# Patient Record
Sex: Female | Born: 1965 | Race: White | Hispanic: No | Marital: Married | Smoking: Never smoker
Health system: Southern US, Community
[De-identification: ages and names within clinical notes are randomized; demographics above are authoritative.]

## PROBLEM LIST (undated history)

## (undated) DIAGNOSIS — E669 Obesity, unspecified: Secondary | ICD-10-CM

## (undated) DIAGNOSIS — Z9089 Acquired absence of other organs: Secondary | ICD-10-CM

## (undated) DIAGNOSIS — R42 Dizziness and giddiness: Secondary | ICD-10-CM

## (undated) DIAGNOSIS — M722 Plantar fascial fibromatosis: Secondary | ICD-10-CM

## (undated) DIAGNOSIS — E785 Hyperlipidemia, unspecified: Secondary | ICD-10-CM

## (undated) DIAGNOSIS — I10 Essential (primary) hypertension: Secondary | ICD-10-CM

## (undated) DIAGNOSIS — R002 Palpitations: Secondary | ICD-10-CM

## (undated) HISTORY — DX: Essential (primary) hypertension: I10

## (undated) HISTORY — DX: Palpitations: R00.2

## (undated) HISTORY — DX: Obesity, unspecified: E66.9

## (undated) HISTORY — DX: Hyperlipidemia, unspecified: E78.5

## (undated) HISTORY — DX: Dizziness and giddiness: R42

## (undated) HISTORY — DX: Plantar fascial fibromatosis: M72.2

## (undated) HISTORY — DX: Acquired absence of other organs: Z90.89

---

## 1988-09-29 HISTORY — PX: BLADDER SURGERY: SHX569

## 2005-04-03 ENCOUNTER — Ambulatory Visit: Payer: Self-pay | Admitting: Family Medicine

## 2007-11-09 ENCOUNTER — Ambulatory Visit: Payer: Self-pay | Admitting: Family Medicine

## 2007-11-12 ENCOUNTER — Telehealth (INDEPENDENT_AMBULATORY_CARE_PROVIDER_SITE_OTHER): Payer: Self-pay | Admitting: Internal Medicine

## 2007-11-13 ENCOUNTER — Ambulatory Visit: Payer: Self-pay | Admitting: Family Medicine

## 2008-04-18 ENCOUNTER — Ambulatory Visit: Payer: Self-pay | Admitting: Family Medicine

## 2008-04-18 DIAGNOSIS — G43009 Migraine without aura, not intractable, without status migrainosus: Secondary | ICD-10-CM | POA: Insufficient documentation

## 2008-04-24 ENCOUNTER — Ambulatory Visit: Payer: Self-pay | Admitting: Family Medicine

## 2008-04-24 ENCOUNTER — Other Ambulatory Visit: Admission: RE | Admit: 2008-04-24 | Discharge: 2008-04-24 | Payer: Self-pay | Admitting: Family Medicine

## 2008-04-24 ENCOUNTER — Encounter (INDEPENDENT_AMBULATORY_CARE_PROVIDER_SITE_OTHER): Payer: Self-pay | Admitting: Internal Medicine

## 2008-04-24 DIAGNOSIS — E669 Obesity, unspecified: Secondary | ICD-10-CM | POA: Insufficient documentation

## 2008-04-25 ENCOUNTER — Encounter (INDEPENDENT_AMBULATORY_CARE_PROVIDER_SITE_OTHER): Payer: Self-pay | Admitting: Internal Medicine

## 2008-04-25 LAB — CONVERTED CEMR LAB
Basophils Absolute: 0 10*3/uL (ref 0.0–0.1)
Basophils Relative: 0.7 % (ref 0.0–3.0)
CO2: 28 meq/L (ref 19–32)
Calcium: 9.1 mg/dL (ref 8.4–10.5)
Chloride: 100 meq/L (ref 96–112)
Cholesterol: 226 mg/dL (ref 0–200)
Cholesterol: 217 mg/dL (ref 0–200)
Creatinine, Ser: 0.8 mg/dL (ref 0.4–1.2)
Direct LDL: 161.4 mg/dL
Direct LDL: 156.5 mg/dL
Glucose, Bld: 103 mg/dL — ABNORMAL HIGH (ref 70–99)
HDL: 32.1 mg/dL — ABNORMAL LOW (ref >39.0)
Hemoglobin: 14.6 g/dL (ref 12.0–15.0)
Lymphocytes Relative: 30.1 % (ref 12.0–46.0)
Monocytes Relative: 10.2 % (ref 3.0–12.0)
Neutro Abs: 3.6 10*3/uL (ref 1.4–7.7)
Neutrophils Relative %: 57 % (ref 43.0–77.0)
RBC: 4.73 M/uL (ref 3.87–5.11)
Sodium: 137 meq/L (ref 135–145)
Triglycerides: 210 mg/dL (ref 0–149)
WBC: 6.1 10*3/uL (ref 4.5–10.5)

## 2008-04-28 ENCOUNTER — Encounter (INDEPENDENT_AMBULATORY_CARE_PROVIDER_SITE_OTHER): Payer: Self-pay | Admitting: Internal Medicine

## 2008-04-28 ENCOUNTER — Encounter (INDEPENDENT_AMBULATORY_CARE_PROVIDER_SITE_OTHER): Payer: Self-pay | Admitting: *Deleted

## 2008-05-30 ENCOUNTER — Ambulatory Visit: Payer: Self-pay | Admitting: Family Medicine

## 2008-08-16 ENCOUNTER — Telehealth (INDEPENDENT_AMBULATORY_CARE_PROVIDER_SITE_OTHER): Payer: Self-pay | Admitting: Internal Medicine

## 2008-08-30 ENCOUNTER — Ambulatory Visit: Payer: Self-pay | Admitting: Family Medicine

## 2008-08-31 ENCOUNTER — Telehealth (INDEPENDENT_AMBULATORY_CARE_PROVIDER_SITE_OTHER): Payer: Self-pay | Admitting: *Deleted

## 2008-09-05 ENCOUNTER — Ambulatory Visit: Payer: Self-pay | Admitting: Family Medicine

## 2008-09-07 ENCOUNTER — Ambulatory Visit: Payer: Self-pay | Admitting: Family Medicine

## 2008-09-07 DIAGNOSIS — E785 Hyperlipidemia, unspecified: Secondary | ICD-10-CM | POA: Insufficient documentation

## 2008-09-07 DIAGNOSIS — E1165 Type 2 diabetes mellitus with hyperglycemia: Secondary | ICD-10-CM

## 2008-09-07 DIAGNOSIS — IMO0002 Reserved for concepts with insufficient information to code with codable children: Secondary | ICD-10-CM | POA: Insufficient documentation

## 2008-09-07 LAB — CONVERTED CEMR LAB: Hgb A1c MFr Bld: 6.4 % — ABNORMAL HIGH (ref 4.6–6.0)

## 2008-09-25 ENCOUNTER — Encounter (INDEPENDENT_AMBULATORY_CARE_PROVIDER_SITE_OTHER): Payer: Self-pay | Admitting: Internal Medicine

## 2008-09-25 ENCOUNTER — Encounter: Payer: Self-pay | Admitting: Family Medicine

## 2008-09-25 ENCOUNTER — Ambulatory Visit: Payer: Self-pay | Admitting: Family Medicine

## 2008-09-25 LAB — CONVERTED CEMR LAB
LH: <0.1 milliintl units/mL
TSH: 1.976 microintl units/mL (ref 0.350–4.50)

## 2008-10-10 ENCOUNTER — Encounter (INDEPENDENT_AMBULATORY_CARE_PROVIDER_SITE_OTHER): Payer: Self-pay | Admitting: Internal Medicine

## 2008-10-10 ENCOUNTER — Ambulatory Visit: Payer: Self-pay | Admitting: Family Medicine

## 2009-01-25 ENCOUNTER — Encounter (INDEPENDENT_AMBULATORY_CARE_PROVIDER_SITE_OTHER): Payer: Self-pay | Admitting: Internal Medicine

## 2009-02-05 ENCOUNTER — Telehealth (INDEPENDENT_AMBULATORY_CARE_PROVIDER_SITE_OTHER): Payer: Self-pay

## 2009-02-15 ENCOUNTER — Ambulatory Visit: Payer: Self-pay | Admitting: Family Medicine

## 2009-02-15 DIAGNOSIS — I10 Essential (primary) hypertension: Secondary | ICD-10-CM | POA: Insufficient documentation

## 2009-08-22 ENCOUNTER — Ambulatory Visit: Payer: Self-pay | Admitting: Family Medicine

## 2009-08-22 ENCOUNTER — Encounter (INDEPENDENT_AMBULATORY_CARE_PROVIDER_SITE_OTHER): Payer: Self-pay | Admitting: *Deleted

## 2009-08-28 LAB — CONVERTED CEMR LAB
Basophils Absolute: 0.1 10*3/uL (ref 0.0–0.1)
Basophils Relative: 0.8 % (ref 0.0–3.0)
Calcium: 9.2 mg/dL (ref 8.4–10.5)
Eosinophils Relative: 3.4 % (ref 0.0–5.0)
GFR calc non Af Amer: 82.22 mL/min (ref >60)
HCT: 40.1 % (ref 36.0–46.0)
Hemoglobin: 13.8 g/dL (ref 12.0–15.0)
Lymphocytes Relative: 28.5 % (ref 12.0–46.0)
Lymphs Abs: 2 10*3/uL (ref 0.7–4.0)
Monocytes Relative: 6.8 % (ref 3.0–12.0)
Neutro Abs: 4.1 10*3/uL (ref 1.4–7.7)
Potassium: 4.5 meq/L (ref 3.5–5.1)
RBC: 4.38 M/uL (ref 3.87–5.11)
Sodium: 140 meq/L (ref 135–145)
WBC: 6.9 10*3/uL (ref 4.5–10.5)

## 2009-10-25 ENCOUNTER — Ambulatory Visit: Payer: Self-pay | Admitting: Internal Medicine

## 2009-12-26 ENCOUNTER — Telehealth: Payer: Self-pay | Admitting: Family Medicine

## 2010-02-08 ENCOUNTER — Ambulatory Visit: Payer: Self-pay | Admitting: Family Medicine

## 2010-04-26 ENCOUNTER — Ambulatory Visit: Payer: Self-pay | Admitting: Family Medicine

## 2010-04-26 DIAGNOSIS — E559 Vitamin D deficiency, unspecified: Secondary | ICD-10-CM | POA: Insufficient documentation

## 2010-04-27 ENCOUNTER — Encounter: Payer: Self-pay | Admitting: Family Medicine

## 2010-04-29 LAB — CONVERTED CEMR LAB
ALT: 59 units/L — ABNORMAL HIGH (ref 0–35)
Alkaline Phosphatase: 40 units/L (ref 39–117)
Basophils Absolute: 0.1 10*3/uL (ref 0.0–0.1)
Bilirubin, Direct: 0.1 mg/dL (ref 0.0–0.3)
CO2: 25 meq/L (ref 19–32)
Calcium: 9.2 mg/dL (ref 8.4–10.5)
Chloride: 103 meq/L (ref 96–112)
Eosinophils Absolute: 0.1 10*3/uL (ref 0.0–0.7)
Glucose, Bld: 107 mg/dL — ABNORMAL HIGH (ref 70–99)
Hemoglobin: 14 g/dL (ref 12.0–15.0)
Lymphocytes Relative: 24.9 % (ref 12.0–46.0)
Lymphs Abs: 1.8 10*3/uL (ref 0.7–4.0)
MCHC: 34.1 g/dL (ref 30.0–36.0)
Neutro Abs: 4.7 10*3/uL (ref 1.4–7.7)
Platelets: 245 10*3/uL (ref 150.0–400.0)
Potassium: 4.5 meq/L (ref 3.5–5.1)
RDW: 12.9 % (ref 11.5–14.6)
Sodium: 134 meq/L — ABNORMAL LOW (ref 135–145)
TSH: 1.5 microintl units/mL (ref 0.35–5.50)
Total Bilirubin: 0.6 mg/dL (ref 0.3–1.2)

## 2010-04-29 LAB — HM DIABETES EYE EXAM: HM Diabetic Eye Exam: NORMAL

## 2010-05-06 ENCOUNTER — Telehealth: Payer: Self-pay | Admitting: Internal Medicine

## 2010-05-07 ENCOUNTER — Ambulatory Visit: Payer: Self-pay | Admitting: Gastroenterology

## 2010-05-07 DIAGNOSIS — R634 Abnormal weight loss: Secondary | ICD-10-CM | POA: Insufficient documentation

## 2010-05-10 ENCOUNTER — Telehealth: Payer: Self-pay | Admitting: Nurse Practitioner

## 2010-05-24 ENCOUNTER — Ambulatory Visit: Payer: Self-pay | Admitting: Internal Medicine

## 2010-07-23 ENCOUNTER — Telehealth (INDEPENDENT_AMBULATORY_CARE_PROVIDER_SITE_OTHER): Payer: Self-pay | Admitting: *Deleted

## 2010-09-27 ENCOUNTER — Encounter: Payer: Self-pay | Admitting: Family Medicine

## 2010-09-27 ENCOUNTER — Ambulatory Visit: Payer: Self-pay | Admitting: Family Medicine

## 2010-09-30 LAB — CONVERTED CEMR LAB
Albumin: 3.9 g/dL (ref 3.5–5.2)
Alkaline Phosphatase: 35 units/L — ABNORMAL LOW (ref 39–117)
BUN: 15 mg/dL (ref 6–23)
Basophils Absolute: 0 10*3/uL (ref 0.0–0.1)
CO2: 28 meq/L (ref 19–32)
Calcium: 9.2 mg/dL (ref 8.4–10.5)
Cholesterol: 243 mg/dL — ABNORMAL HIGH (ref 0–200)
Creatinine, Ser: 0.8 mg/dL (ref 0.4–1.2)
Direct LDL: 182.9 mg/dL
Eosinophils Absolute: 0.2 10*3/uL (ref 0.0–0.7)
GFR calc non Af Amer: 86.81 mL/min (ref >60.00)
Glucose, Bld: 117 mg/dL — ABNORMAL HIGH (ref 70–99)
HDL: 39.4 mg/dL (ref >39.00)
Hemoglobin: 14.1 g/dL (ref 12.0–15.0)
Lymphocytes Relative: 25.5 % (ref 12.0–46.0)
MCHC: 33.9 g/dL (ref 30.0–36.0)
Monocytes Relative: 7 % (ref 3.0–12.0)
Neutro Abs: 5.1 10*3/uL (ref 1.4–7.7)
Neutrophils Relative %: 64.6 % (ref 43.0–77.0)
Platelets: 287 10*3/uL (ref 150.0–400.0)
RDW: 12.9 % (ref 11.5–14.6)
Total Bilirubin: 0.6 mg/dL (ref 0.3–1.2)
Triglycerides: 206 mg/dL — ABNORMAL HIGH (ref 0.0–149.0)
Vit D, 25-Hydroxy: 35 ng/mL (ref 30–89)

## 2010-10-02 ENCOUNTER — Other Ambulatory Visit
Admission: RE | Admit: 2010-10-02 | Discharge: 2010-10-02 | Payer: Self-pay | Source: Home / Self Care | Admitting: Family Medicine

## 2010-10-02 ENCOUNTER — Ambulatory Visit
Admission: RE | Admit: 2010-10-02 | Discharge: 2010-10-02 | Payer: Self-pay | Source: Home / Self Care | Attending: Family Medicine | Admitting: Family Medicine

## 2010-10-02 DIAGNOSIS — M722 Plantar fascial fibromatosis: Secondary | ICD-10-CM | POA: Insufficient documentation

## 2010-10-02 LAB — HM PAP SMEAR

## 2010-10-07 ENCOUNTER — Ambulatory Visit
Admission: RE | Admit: 2010-10-07 | Discharge: 2010-10-07 | Payer: Self-pay | Source: Home / Self Care | Attending: Family Medicine | Admitting: Family Medicine

## 2010-10-16 ENCOUNTER — Encounter: Payer: Self-pay | Admitting: Family Medicine

## 2010-10-16 ENCOUNTER — Ambulatory Visit
Admission: RE | Admit: 2010-10-16 | Discharge: 2010-10-16 | Payer: Self-pay | Source: Home / Self Care | Attending: Family Medicine | Admitting: Family Medicine

## 2010-10-16 LAB — CONVERTED CEMR LAB
Bilirubin Urine: NEGATIVE
Casts: 0 /lpf
KOH Prep: NEGATIVE
Specific Gravity, Urine: >=1.03
Urine crystals, microscopic: 0 /hpf
WBC Urine, dipstick: NEGATIVE
pH: 6

## 2010-10-28 LAB — HM MAMMOGRAPHY: HM Mammogram: NORMAL

## 2010-10-29 ENCOUNTER — Ambulatory Visit: Payer: Self-pay | Admitting: Family Medicine

## 2010-10-29 ENCOUNTER — Encounter: Payer: Self-pay | Admitting: Family Medicine

## 2010-10-29 NOTE — Progress Notes (Signed)
Summary: triage  Phone Note Call from Patient Call back at Home Phone [phone] Call back at [phone]   Caller: Patient Call For: Dr. Leone Payor Reason for Call: Talk to Nurse Summary of Call: since July pt has been experiencing mid abd pain, separate pain radiating down right side and into lower back area, and diarrhea... has actually had these symptoms off and on for 2 years, but since July these symptoms have become almost daily... pt states she cannot wait until next available in September... has been to PCP, tested, nothing diagnosed Initial call taken by: Vallarie Mare,  May 06, 2010 11:01 AM  Follow-up for Phone Call        Pt. will see Willette Cluster NP on 05-07-10 at 11am. Pt. advised of med.list/co-pay/cx.policy. Follow-up by: Laureen Ochs LPN,  May 06, 2010 11:10 AM

## 2010-10-29 NOTE — Assessment & Plan Note (Signed)
Summary: F/U ABD PAIN, DIARRHEA, saw NP   History of Present Illness Visit Type: Follow-up Visit Primary GI MD: Stan Head MD [facility] Primary Provider: Judith Part MD Requesting Provider: Vic Ripper, FNP Chief Complaint: abdominal pain and diarrhea follow-up  History of Present Illness:   She was seen on 8/8 for diarrhea. Had been on antibiotics and metronidazole was prescribed. Her diarrhea seems to be resolved, no bowel movement in 4 days. Stools are still loose and urgent.  She stopped metformin while she was on the metronidzole because she had headaches and felt unwell.  When she has her menstrual cycle , the week prior to menses, she is constipated, has diarrhea then back to normal with regular defecation.   GI Review of Systems    Reports acid reflux.      Denies abdominal pain, belching, bloating, chest pain, dysphagia with liquids, dysphagia with solids, heartburn, loss of appetite, nausea, vomiting, vomiting blood, weight loss, and  weight gain.        Denies anal fissure, black tarry stools, change in bowel habit, constipation, diarrhea, diverticulosis, fecal incontinence, heme positive stool, hemorrhoids, irritable bowel syndrome, jaundice, light color stool, liver problems, rectal bleeding, and  rectal pain.    Current Medications (verified): 1)  Aleve 220 Mg Tabs (Naproxen Sodium) .... Two Tablets By Mouth Once Daily As Needed 2)  Relpax 40 Mg Tabs (Eletriptan Hydrobromide) .Marland Kitchen.. 1 At Onset of Headache,, Repeat in 2 Hrs If Not Resolved 3)  Metformin Hcl 500 Mg Xr24h-Tab (Metformin Hcl) .... Three Tablets By Mouth in The Evening 4)  Lisinopril 20 Mg Tabs (Lisinopril) .... One Tablet By Mouth Once Daily 5)  Prevacid 24hr 15 Mg Cpdr (Lansoprazole) .... One Daily 6)  Aspirin 81 Mg  Tabs (Aspirin) .... One Daily 7)  Zyrtec Hives Relief 10 Mg Tabs (Cetirizine Hcl) .... Otc As Directed. 8)  Amaryl 1 Mg Tabs (Glimepiride) .... Take 1 Tablet By Mouth Once A Day 9)  Vitamin  D3 5000 Unit/ml Liqd (Cholecalciferol) .... Take One Tab Daily By Mouth 10)  Onetouch Ultra Test  Strp (Glucose Blood) .... Test Once Daily or As Needed. Dm 250.00 11)  Levsin/sl 0.125 Mg Subl (Hyoscyamine Sulfate) .Marland Kitchen.. 1 Pill Under Toungue Up To Every 6 Hours For Abdominal Cramps  Allergies (verified): No Known Drug Allergies  Past History:  Past Medical History: Reviewed history from 02/08/2010 and no changes required. HYPERTENSION (ICD-401.9) HYPERLIPIDEMIA (ICD-272.4) DIABETES MELLITUS, TYPE II, CONTROLLED (ICD-250.00) PALPITATIONS (ICD-785.1) OBESITY (ICD-278.00) COMMON MIGRAINE (ICD-346.10) VERTIGO (ICD-780.4)   endocrine   Past Surgical History: Reviewed history from 02/14/2009 and no changes required. Bladder tack after her 2nd delivery in 1990 Tonsillectomy  1988  Family History: Reviewed history from 10/25/2009 and no changes required. Father: died at 33--MI due to alolergic reaction to pen. Mother: thyroidectomy--cause not known, RA, HBP, ? drug addiction, depression  Siblings: 3 sisters--1 with thyroid cancer, reoccured last yr(2008), depression                                1--twin to pt--L&W, depression                                 1--L&W, depression               1 br--drug addiction DM- 0 MI- 0  CVA- MGM Prostate Cancer-0 Breast Cancer-0 Ovarian Cancer-0  Uterine Cancer-0 Colon Cancer-0 Drug/ ETOH Abuse- 0 Depression- mother's side MGM--Alzheimers  Family History of Ulcerative Colitis: Son  Social History: Reviewed history from 10/25/2009 and no changes required. Occupation: school sysem front office  Married children 2 boys Never Smoked Alcohol use-no Regular exercise-no  Vital Signs:  Patient profile:   45 year old female Height:      63.5 inches Weight:      206 pounds BMI:     36.05 Pulse rate:   84 / minute Pulse rhythm:   regular BP sitting:   118 / 64  (left arm)  Vitals Entered By: Milford Cage NCMA (May 24, 2010 1:32  PM)  Physical Exam  General:  Well developed, well nourished, no acute distress.   Impression & Recommendations:  Problem # 1:  INFECTIOUS DIARRHEA (ICD-009.2) Assessment Improved She has resolved or nearly so after taking metronidazole. It sounds like she had C.diff or other antibiotic-associated colitis. she may be having some post-IBS whch should resolve. She is at risk for small bowel bacterial overgrowth also so that was possible. will see how she does. F/U as needed.  Patient Instructions: 1)  If you fail to return to your normal bowel habit let Dr. Leone Payor know (or if things deteriorate and diarrhea returns). 2)  Please schedule a follow-up appointment as needed.  3)  The medication list was reviewed and reconciled.  All changed / newly prescribed medications were explained.  A complete medication list was provided to the patient / caregiver. cc: Reather Littler, MD

## 2010-10-29 NOTE — Assessment & Plan Note (Signed)
Summary: TICK BITE   Vital Signs:  Patient profile:   45 year old female Height:      63.5 inches Weight:      212.25 pounds BMI:     37.14 Temp:     98.1 degrees F oral Pulse rate:   80 / minute Pulse rhythm:   regular BP sitting:   132 / 82  (right arm) Cuff size:   large  Vitals Entered By: Lewanda Rife LPN (Feb 08, 2010 8:21 AM) CC: Tick  bites on left upper leg and under lt arm   History of Present Illness: got bitten by ticks  4 bites -- rear / back / under L arm / L leg  was in the woods on sat 7th (one on leg tues) , other 3 sunday   no target shape rash   other insect bites   kept one tick  got them all off   feels fine  no fever/ no n/v / no st and no joint pain  one on leg is much redder and tender   also needs me to fill out form for her work - stating her bmi , ? for insurance     Allergies (verified): No Known Drug Allergies  Past History:  Past Surgical History: Last updated: 02/14/2009 Bladder tack after her 2nd delivery in 1990 Tonsillectomy  1988  Family History: Last updated: Nov 14, 2009 Father: died at 33--MI due to alolergic reaction to pen. Mother: thyroidectomy--cause not known, RA, HBP, ? 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 Eyes:  Denies blurring and eye pain. ENT:  Denies nasal congestion and sore throat. CV:  Denies chest pain or discomfort. Resp:  Denies cough and wheezing. GI:  Denies abdominal pain, bloody stools, change in bowel habits, and nausea. Derm:  Complains of itching and lesion(s); denies poor wound healing. Neuro:  Denies numbness and tingling. Endo:  Denies cold intolerance, excessive thirst, excessive urination, and heat intolerance. Heme:  Denies abnormal bruising and bleeding.  Physical Exam  General:  overweight but generally well appearing  Head:  normocephalic, atraumatic, and no abnormalities observed.   Eyes:  vision grossly intact, pupils equal, pupils round, and pupils reactive to light.  no conjunctival pallor, injection or icterus  Mouth:  pharynx  pink and moist.   Neck:  No deformities, masses, or tenderness noted. Lungs:  Normal respiratory effort, chest expands symmetrically. Lungs are clear to auscultation, no crackles or wheezes. Heart:  Normal rate and regular rhythm. S1 and S2 normal without gallop, murmur, click, rub or other extra sounds. Abdomen:  soft and non-tender.   Msk:  no acute joint changes Extremities:  No clubbing, cyanosis, edema, or deformity noted with normal full range of motion of all joints.   Neurologic:  sensation intact to light touch, gait normal, and DTRs symmetrical and normal.   Skin:  tiny red papulse - L buttock and under arm bite on upper L leg with 1-2 cm surrounding erythema -- no target shape and no vesicles  no rash on skin  Cervical Nodes:  No lymphadenopathy  noted Inguinal Nodes:  No significant adenopathy Psych:  normal affect, talkative and pleasant    Impression & Recommendations:  Problem # 1:  TICK BITE (ICD-E906.4) Assessment New no systemic symptoms - but significant inflammation at one site disc in detail how to prev bites in future update Td today tx with doxycycline for infx -- will also hopefully ward off tick fever  rev wound care  adv to update asap if any fever/ constitutional symptoms or joint pain or any inc in redness/ swelling of bites  Problem # 2:  OBESITY (ICD-278.00) Assessment: Unchanged rev bmi today pt will be faxing form for me to fill out for this - she forgot to bring it   Complete Medication List: 1)  Aleve 220 Mg Tabs (Naproxen sodium) .... Two tablets by mouth once daily as needed 2)  Relpax 40 Mg Tabs (Eletriptan hydrobromide) .Marland Kitchen.. 1 at onset of headache,, repeat in 2 hrs if not resolved 3)  Metformin Hcl 500 Mg Xr24h-tab (Metformin hcl) .... Three tablets by mouth in the evening 4)  Lisinopril 20 Mg Tabs (Lisinopril) .... One tablet by mouth once daily 5)  Test Strips For Freestyle Promise Glucometer  .Marland Kitchen.. 1 two times a day --new diabetic  250.00 6)  Crestor 20 Mg Tabs (Rosuvastatin calcium) .... One daily 7)  Prevacid 24hr 15 Mg Cpdr (Lansoprazole) .... One daily 8)  Aspirin 81 Mg Tabs (Aspirin) .... One daily 9)  Zyrtec Hives Relief 10 Mg Tabs (Cetirizine hcl) .... Otc as directed. 10)  Amaryl 1 Mg Tabs (Glimepiride) .... Take 1 tablet by mouth once a day 11)  Vitamin D3 5000 Unit/ml Liqd (Cholecalciferol) .... Take one tab daily by mouth 12)  Doxycycline Hyclate 100 Mg Caps (Doxycycline hyclate) .Marland Kitchen.. 1 by mouth two times a day for 7 days  Other Orders: TD Toxoids IM 7 YR + (16109) Admin 1st Vaccine (60454) Admin 1st Vaccine [facility]) [phone])  Patient Instructions: 1)  take the doxycyline as directed  2)  use any antibiotic ointment on bite on leg  3)  keep it clean and dry with antibacterial  soap and water  4)  if worse redness or swelling or any fever or other symptoms - update me  5)  your bmi is 377.14 6)  tetnus shot today Prescriptions: DOXYCYCLINE HYCLATE 100 MG CAPS (DOXYCYCLINE HYCLATE) 1 by mouth two times a day for 7 days  #14 x 0   Entered and Authorized by:   Judith Part MD   Signed by:   Judith Part MD on 02/08/2010   Method used:   Electronically to        [facility] (retail)  [address], [address]       [place], [place]  [address]       Ph: [phone]       Fax: [phone]   RxID:   [id number]   Current Allergies (reviewed today): No known allergies    Tetanus/Td Vaccine    Vaccine Type: Td    Site: right deltoid    Mfr: Sanofi Pasteur    Dose: 0.5 ml    Route: IM    Given by: Lewanda Rife LPN    Exp. Date: 08/14/2011    Lot #: [id number]    VIS given: 08/17/07 version given Feb 08, 2010.

## 2010-10-29 NOTE — Assessment & Plan Note (Signed)
Summary: diarrhea   Vital Signs:  Patient profile:   45 year old female Height:      63.5 inches Weight:      205.75 pounds BMI:     36.00 Temp:     98 degrees F oral Pulse rate:   72 / minute Pulse rhythm:   regular BP sitting:   132 / 84  (left arm) Cuff size:   large  Vitals Entered By: Lewanda Rife LPN (April 26, 2010 10:40 AM) CC: CPX with pap and breast exam LMP 04/01/10 Pt said she has been sick for one month and wants to talk with Dr Milinda Antis about that.   History of Present Illness:   wt is up 7 lb  bp is fair at 132/84 first dheck     DM AIC 6.4 in the fall-- DrKumar    thyroid ca in family  mam09 self exam   actually wants to talk about diarrhea and stomach cramping  happens intermittently --2 years  happened early in july , and last and this week is much worse  cramping is in upper abdomen - right before she has to have bm -- then relieved  burning sensation over R abd  diarrhea - 6-8 times per day - watery and loose  yellow to green- no blood and no dark stool  gets little things in it an inch long and yellow and a lot of mucous - ? if poss worms  they are white on the inside  some nausea but no vomiting  no fever   no out of country travel   son has ulcerative colitis   is on metformin - knows this can cause diarrhea-- and the problems preceeded it    did camp in june -- was in a cabin was drinking tap water - poss well water  has some pets in house - none with worms  has never been dx with ibs    Allergies (verified): No Known Drug Allergies  Past History:  Past Medical History: Last updated: 02/08/2010 HYPERTENSION (ICD-401.9) HYPERLIPIDEMIA (ICD-272.4) DIABETES MELLITUS, TYPE II, CONTROLLED (ICD-250.00) PALPITATIONS (ICD-785.1) OBESITY (ICD-278.00) COMMON MIGRAINE (ICD-346.10) VERTIGO (ICD-780.4)   endocrine   Past Surgical History: Last updated: 02/14/2009 Bladder tack after her 2nd delivery in 1990 Tonsillectomy   1988  Family History: Last updated: October 29, 2009 Father: died at 33--MI due to alolergic reaction to pen. Mother: thyroidectomy--cause not known, RA, HBP, ? 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 Eyes:  Denies blurring and eye irritation. CV:  Denies chest pain or discomfort, palpitations, shortness of breath with exertion, and swelling of feet. Resp:  Denies cough, shortness of breath, and wheezing. GI:  Complains of abdominal pain, change in bowel habits, and gas; denies bloody stools, loss of appetite, nausea, and vomiting. MS:  Denies joint redness, joint swelling, and cramps. Derm:  Denies lesion(s), poor wound healing, and rash. Neuro:  Denies numbness and tingling. Psych:  mood is ok . Endo:  Denies cold intolerance, excessive thirst, excessive urination, and heat intolerance. Heme:  Denies abnormal bruising and bleeding.  Physical Exam  General:  overweight but generally well appearing seems down today  Head:  normocephalic, atraumatic, and no abnormalities observed.   Eyes:  vision grossly intact, pupils equal, pupils round, and  pupils reactive to light.  no conjunctival pallor, injection or icterus  Mouth:  pharynx pink and moist.   Neck:  No deformities, masses, or tenderness noted. Lungs:  Normal respiratory effort, chest expands symmetrically. Lungs are clear to auscultation, no crackles or wheezes. Heart:  Normal rate and regular rhythm. S1 and S2 normal without gallop, murmur, click, rub or other extra sounds. Abdomen:  tender diffusely- esp epigastrium and LLQ no rebound or gaurding soft, normal bowel sounds, no distention, no masses, no hepatomegaly, and no splenomegaly.   Msk:  No deformity or scoliosis noted of thoracic or lumbar spine.   Extremities:  No clubbing, cyanosis, edema, or deformity noted with normal full range of motion of all joints.   Skin:  Intact without suspicious lesions or rashes nl color and turgor  Psych:  seems down and tired  no facial expression at all good eye contact and comm skills    Impression & Recommendations:  Problem # 1:  DIARRHEA (ICD-787.91) Assessment New intermittent but getting much worse since camping trip in june  pt has seen what may be worms in stool  cramping is severe at times  pre-dated start of metformin given px for levsin sl  disc diff incl parasites/ infectious/ ibs/ inflam bd/bact overgrowth  will do labs and stool tests today and update if all nl will need to ref to GI  Orders: Venipuncture ([address]109) TLB-Renal Function Panel (80069-RENAL) TLB-Hepatic/Liver Function Pnl (80076-HEPATIC) TLB-CBC Platelet - w/Differential (85025-CBCD) TLB-TSH (Thyroid Stimulating Hormone) (84443-TSH) T-Culture, Stool ([id number]) T-Culture, C-Diff Toxin A/B (60454-09811) T-Stool Giardia / Crypto- EIA (91478) T-Stool for O&P (29562-13086)  Problem # 2:  ABDOMINAL PAIN, GENERALIZED (ICD-789.07) Assessment: New at times crampy before loose bm and at other times steady epigastric pain  does also have hx of reflux  adv to stay on prevacid daily instead 14  on 14 off will be mindful of diet - watch for triggers  avoid nsaids - disc poss of gastritis  ref to GI if tests are neg   Problem # 3:  UNSPECIFIED VITAMIN D DEFICIENCY (ICD-268.9) Assessment: New pt on vit D from Dr Lucianne Muss and wants to check it  on 5000 international units daily Orders: T-Vitamin D (25-Hydroxy) [id number] [id number]) Specimen Handling (99000)  Complete Medication List: 1)  Aleve 220 Mg Tabs (Naproxen sodium) .... Two tablets by mouth once daily as needed 2)  Relpax 40 Mg Tabs (Eletriptan hydrobromide) .Marland Kitchen.. 1 at onset of headache,, repeat in 2 hrs if not resolved 3)  Metformin Hcl 500 Mg Xr24h-tab (Metformin hcl) .... Three tablets by mouth in the evening 4)  Lisinopril 20 Mg Tabs (Lisinopril) .... One  tablet by mouth once daily 5)  Prevacid 24hr 15 Mg Cpdr (Lansoprazole) .... One daily 6)  Aspirin 81 Mg Tabs (Aspirin) .... One daily 7)  Zyrtec Hives Relief 10 Mg Tabs (Cetirizine hcl) .... Otc as directed. 8)  Amaryl 1 Mg Tabs (Glimepiride) .... Take 1 tablet by mouth once a day 9)  Vitamin D3 5000 Unit/ml Liqd (Cholecalciferol) .... Take one tab daily by mouth 10)  Onetouch Ultra Test Strp (Glucose blood) .... Test once daily or as needed. dm 250.00 11)  Levsin/sl 0.125 Mg Subl (Hyoscyamine sulfate) .Marland Kitchen.. 1 pill under toungue up to every 6 hours for abdominal cramps  Patient Instructions: 1)  try the levsin for cramps as needed  2)  take the prevacid every single day 3)  stool studies today  4)  if all normal - will refer to GI  5)  keep an eye on your diet  6)  stay away from aleve  7)  re schedule in 1 month physical- any 30 minute slot please is fine  Prescriptions: LEVSIN/SL 0.125 MG SUBL (HYOSCYAMINE SULFATE) 1 pill under toungue up to every 6 hours for abdominal cramps  #30 x 1   Entered and Authorized by:   Judith Part MD   Signed by:   Judith Part MD on 04/26/2010   Method used:   Electronically to        [facility] (retail)       [address], [address]       [place], [place]  [address]       Ph: [phone]       Fax: [phone]   RxID:   [id number]   Current Allergies (reviewed today): No known allergies

## 2010-10-29 NOTE — Assessment & Plan Note (Signed)
Summary: RECTAL PAIN   History of Present Illness Visit Type: consult  Primary GI MD: Stan Head MD [facility] Primary Provider: Vic Ripper, FNP Requesting Provider: Vic Ripper, FNP Chief Complaint: Rectal pain, nausea, constipation, diarrhea, acid reflux, bloating, and belching  History of Present Illness:   45 yo white woman with several month history of intermittent ano-rectal pain. Feels like an open sore or pain when sitting. Fells like it is right at the opening, lasts for up to hours. It ay be painful when she feels stool in the rectum (full there) and can burn with diarrhea.  It does not disturb sleep. It is only when she sits or if er rectum has stool in it. One time she saw red blood on the stool a very small spot.  Lately she has had an increase in GERD sxs as indicated below, x 3 days or so. PPi ususally controls it well.    GI Review of Systems    Reports acid reflux, belching, bloating, heartburn, and  nausea.      Denies abdominal pain, chest pain, dysphagia with liquids, dysphagia with solids, loss of appetite, vomiting, vomiting blood, weight loss, and  weight gain.      Reports constipation, diarrhea, and  rectal pain.     Denies anal fissure, black tarry stools, change in bowel habit, diverticulosis, fecal incontinence, heme positive stool, hemorrhoids, irritable bowel syndrome, jaundice, light color stool, liver problems, and  rectal bleeding.    Current Medications (verified): 1)  Aleve 220 Mg Tabs (Naproxen Sodium) .... Two Tablets By Mouth Once Daily As Needed 2)  Relpax 40 Mg Tabs (Eletriptan Hydrobromide) .Marland Kitchen.. 1 At Onset of Headache,, Repeat in 2 Hrs If Not Resolved 3)  Metformin Hcl 500 Mg Xr24h-Tab (Metformin Hcl) .... Two Tablets By Mouth in The Evening 4)  Lisinopril 20 Mg Tabs (Lisinopril) .... One Tablet By Mouth Once Daily 5)  Test Strips For Freestyle Promise Glucometer .Marland Kitchen.. 1 Two Times A Day --New Diabetic  250.00 6)  Crestor 20 Mg Tabs  (Rosuvastatin Calcium) .... One Daily 7)  Prevacid 24hr 15 Mg Cpdr (Lansoprazole) .... One Daily 8)  Aspirin 81 Mg  Tabs (Aspirin) .... One Daily 9)  Zyrtec Hives Relief 10 Mg Tabs (Cetirizine Hcl) .... Otc As Directed. 10)  Onglyza 5 Mg Tabs (Saxagliptin Hcl) .... One Tablet By Mouth Once Daily  Allergies (verified): No Known Drug Allergies  Past History:  Past Medical History: HYPERTENSION (ICD-401.9) HYPERLIPIDEMIA (ICD-272.4) DIABETES MELLITUS, TYPE II, CONTROLLED (ICD-250.00) PALPITATIONS (ICD-785.1) OBESITY (ICD-278.00) COMMON MIGRAINE (ICD-346.10) VERTIGO (ICD-780.4)  Past Surgical History: Reviewed history from 02/14/2009 and no changes required. Bladder tack after her 2nd delivery in 1990 Tonsillectomy  1988  Family History: Father: died at 33--MI due to alolergic reaction to pen. Mother: thyroidectomy--cause not known, RA, HBP, ? drug addiction, depression  Siblings: 3 sisters--1 with thyroid cancer, reoccured last yr(2008), depression                                1--twin to pt--L&W, depression                                 1--L&W, depression               1 br--drug addiction DM- 0 MI- 0  CVA- MGM Prostate Cancer-0 Breast Cancer-0 Ovarian Cancer-0 Uterine Cancer-0 Colon Cancer-0 Drug/  ETOH Abuse- 0 Depression- mother's side MGM--Alzheimers  Family History of Ulcerative Colitis: Son  Social History: Occupation: school sysem front office  Married children 2 boys Never Smoked Alcohol use-no Regular exercise-no  Review of Systems       +shoulder pain, light-headed x 3 days All other ROS negative except as per HPI.   Vital Signs:  Patient profile:   45 year old female Height:      63.5 inches Weight:      208 pounds BMI:     36.40 BSA:     1.98 Pulse rate:   62 / minute Pulse rhythm:   regular BP sitting:   124 / 68  (left arm) Cuff size:   regular  Vitals Entered By: Ok Anis CMA (October 25, 2009 1:54 PM)  Physical  Exam  General:  obese.  NAD Eyes:  PERRLA, no icterus. Mouth:  No deformity or lesions, dentition normal. Lungs:  Clear throughout to auscultation. Heart:  Regular rate and rhythm; no murmurs, rubs,  or bruits. 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 Will try conservative Tx with NSAIDS, rubber or foam doughnut and follow-up. She is seeing rheumatology in March for shoulder pains ad so ? if she has a polyarthritis or arthropathy. She could need a pelvic MR. I find it hard to make a case for anal or rectal problem.  Patient Instructions: 1)  Purchase Donut as discussed. 2)  Take Aleve 2 tablets by mouth two times a day  3)  Please schedule a follow-up appointment in 1 month.  If your symptoms worsen prior to your appt call our office for a sooner appointment. 4)  Copy sent to : Reather Littler, MD 5)  The medication list was reviewed and reconciled.  All changed / newly prescribed medications were explained.  A complete medication list was provided to the patient / caregiver.

## 2010-10-29 NOTE — Assessment & Plan Note (Signed)
Summary: ABD. PAIN/DIARRHEA, WORSE X1 MONTH.  (DR.GESSNER PT.)  DEBORAH   History of Present Illness Visit Type: follow up Primary GI MD: Stan Head MD [facility] Primary Provider: Judith Part MD Requesting Provider: Vic Ripper, FNP Chief Complaint: Abdominal pain & diarrhea x 1  month History of Present Illness:   Patient is a 45 year old female , new to GI, who presents with one month history of diarrhea. Havng up to 10 loose BMs a day. Has had nocturnal diarrhea only on two occasions. Diarrhea associated with mid abdominal cramping which is usually relieved with defecation. Mild nausea but nothing significant. No rectal bleeding. Has lost several pounds. Diarrhea worse after meals so eating only when really hungry. 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 Was taking Aleve but PCP asked her to stop because of elevated LFTs. Endocrinologist also stopped her Crestor.  On Metformin for three years. Initially it did cause loose stool and patient stopped treatment. After restarting med last fall she had not had any problems until now.   Patient feels all of her health issues started two years ago when she began having alternating constipation and diarrhea.  A few months ago started noticing elongated yellow things in stool.  Retrieved and dissected one of them, it looked like a potato.    GI Review of Systems    Reports abdominal pain, bloating, loss of appetite, and  nausea.     Location of  Abdominal pain: mid abdomen.    Denies acid reflux, belching, chest pain, dysphagia with liquids, dysphagia with solids, heartburn, vomiting, vomiting blood, weight loss, and  weight gain.      Reports diarrhea and  light color stool.     Denies anal fissure, black tarry stools, change in bowel habit, constipation, diverticulosis, fecal incontinence, heme positive stool, hemorrhoids, irritable bowel syndrome, jaundice, liver problems, rectal bleeding, and  rectal  pain.         Current Medications (verified): 1)  Aleve 220 Mg Tabs (Naproxen Sodium) .... Two Tablets By Mouth Once Daily As Needed 2)  Relpax 40 Mg Tabs (Eletriptan Hydrobromide) .Marland Kitchen.. 1 At Onset of Headache,, Repeat in 2 Hrs If Not Resolved 3)  Metformin Hcl 500 Mg Xr24h-Tab (Metformin Hcl) .... Three Tablets By Mouth in The Evening 4)  Lisinopril 20 Mg Tabs (Lisinopril) .... One Tablet By Mouth Once Daily 5)  Prevacid 24hr 15 Mg Cpdr (Lansoprazole) .... One Daily 6)  Aspirin 81 Mg  Tabs (Aspirin) .... One Daily 7)  Zyrtec Hives Relief 10 Mg Tabs (Cetirizine Hcl) .... Otc As Directed. 8)  Amaryl 1 Mg Tabs (Glimepiride) .... Take 1 Tablet By Mouth Once A Day 9)  Vitamin D3 5000 Unit/ml Liqd (Cholecalciferol) .... Take One Tab Daily By Mouth 10)  Onetouch Ultra Test  Strp (Glucose Blood) .... Test Once Daily or As Needed. Dm 250.00 11)  Levsin/sl 0.125 Mg Subl (Hyoscyamine Sulfate) .Marland Kitchen.. 1 Pill Under Toungue Up To Every 6 Hours For Abdominal Cramps  Allergies (verified): No Known Drug Allergies  Past History:  Past Medical History: Reviewed history from 02/08/2010 and no changes required. HYPERTENSION (ICD-401.9) HYPERLIPIDEMIA (ICD-272.4) DIABETES MELLITUS, TYPE II, CONTROLLED (ICD-250.00) PALPITATIONS (ICD-785.1) OBESITY (ICD-278.00) COMMON MIGRAINE (ICD-346.10) VERTIGO (ICD-780.4)   endocrine   Past Surgical History: Reviewed history from 02/14/2009 and no changes required. Bladder tack after her 2nd delivery in 1990 Tonsillectomy  1988  Family History: Reviewed history from 10/25/2009 and no changes required. Father: died at 33--MI due to alolergic  reaction to pen. Mother: thyroidectomy--cause not known, RA, HBP, ? drug addiction, depression  Siblings: 3 sisters--1 with thyroid cancer, reoccured last yr(2008), depression                                1--twin to pt--L&W, depression                                 1--L&W, depression               1 br--drug  addiction DM- 0 MI- 0  CVA- MGM Prostate Cancer-0 Breast Cancer-0 Ovarian Cancer-0 Uterine Cancer-0 Colon Cancer-0 Drug/ ETOH Abuse- 0 Depression- mother's side MGM--Alzheimers  Family History of Ulcerative Colitis: Son  Social History: Reviewed history from 10/25/2009 and no changes required. Occupation: school sysem front office  Married children 2 boys Never Smoked Alcohol use-no Regular exercise-no  Review of Systems       The patient complains of back pain, fatigue, night sweats, shortness of breath, sleeping problems, and headaches-new.  The patient denies allergy/sinus, anemia, anxiety-new, arthritis/joint pain, blood in urine, breast changes/lumps, change in vision, confusion, cough, coughing up blood, depression-new, fainting, fever, hearing problems, heart murmur, heart rhythm changes, itching, menstrual pain, muscle pains/cramps, nosebleeds, pregnancy symptoms, skin rash, sore throat, swelling of feet/legs, swollen lymph glands, thirst - excessive , urination - excessive , urination changes/pain, urine leakage, vision changes, and voice change.    Vital Signs:  Patient profile:   45 year old female Height:      63.5 inches Weight:      202.38 pounds BMI:     35.42 Pulse rate:   88 / minute Pulse rhythm:   regular BP sitting:   132 / 80  (left arm) Cuff size:   regular  Vitals Entered By: June McMurray CMA Duncan Dull) (May 07, 2010 10:47 AM)  Physical Exam  General:  Well developed, well nourished, no acute distress. Head:  Normocephalic and atraumatic. Eyes:  Conjunctiva pink, no icterus.  Mouth:  No oral lesions. Tongue moist.  Neck:  Supple; no masses. Lungs:  Clear throughout to auscultation. Heart:  Regular rate and rhythm; no murmurs, rubs,  or bruits. Abdomen:  Abdomen soft,  nondistended, mild diffuse lower abdominal tenderness.  No obvious masses or hepatomegaly.Normal bowel sounds.  Msk:  Symmetrical with no gross deformities. Normal  posture. Extremities:  No palmar erythema, no edema.  Neurologic:  Alert and  oriented x4;  grossly normal neurologically. Skin:  Intact without significant lesions or rashes. Cervical Nodes:  No significant cervical adenopathy. Psych:  Alert and cooperative. Normal mood and affect.   Impression & Recommendations:  Problem # 1:  DIARRHEA (ICD-787.91) Assessment Deteriorated Associated with mid abdominal cramping that is usually relieved with defecation.  Patient did have antibiotics within the last two months for a  tick bite. Stool studies negative including one C-Diff. Not taking Hyoscyamine as cramps cease before medication even works. Recent CBC and TSH okay. May be IBS. May be microscopic colitis. May be small bowel bacterial overgrowth (diabetes). May be nfectious. Trial of Flagyl, probiotics and low residue diet.  Follow up with Dr. Leone Payor in a couple of weeks and if no improvment may need colonoscopy with random biopsies.   Problem # 2:  DIABETES MELLITUS, TYPE II, CONTROLLED (ICD-250.00) Assessment: Comment Only  Problem # 3:  HYPERTENSION (ICD-401.9) Assessment: Comment  Only  Patient Instructions: 1)  We sent a prescription for Flagyl to [facility] [address], [place]. 2)  Take Align, 1 capsule daily for 14 days.  Samples given. 3)  Low Residue Diet brochure given. 4)  We made you a follow up appointment with Dr. Leone Payor for 05-24-10 at 1:30 PM.  5)  Copy sent to : Judith Part, MD 6)  The medication list was reviewed and reconciled.  All changed / newly prescribed medications were explained.  A complete medication list was provided to the patient / caregiver. Prescriptions: FLAGYL 500 MG TABS (METRONIDAZOLE) Take 1 tab 3 times daily x 10 days  #30 x 0   Entered by:   Lowry Ram NCMA   Authorized by:   Willette Cluster NP   Signed by:   Lowry Ram NCMA on 05/07/2010   Method used:   Electronically to        [facility] [address] (retail)       [address],  [address]       [place], [place]  [address]       Ph: [phone]       Fax: [phone]   RxID:   [id number]

## 2010-10-29 NOTE — Progress Notes (Signed)
----   Converted from flag ---- ---- 07/22/2010 7:49 PM, Colon Flattery Tower MD wrote: please check wellness, lipid, vit D and AIC for v70.0 , vit D def and 250.0 thanks  ---- 07/18/2010 11:50 AM, Liane Comber CMA (AAMA) wrote: Lab orders please! Good Morning! This pt is scheduled for cpx labs Friday, which labs to draw and dx codes to use? Thanks Tasha ------------------------------

## 2010-10-29 NOTE — Progress Notes (Signed)
Summary: Headaches  Phone Note Call from Patient Call back at Home Phone [phone]   Caller: Patient Call For: Willette Cluster, M.D. Summary of Call: Medicine prescribed to her are causing headache. Severe heaches that keep her up at night. Advil upsets her stomach. And she is not supposed to take Tylenol or Ibobuprofen together with the medicine that PA ordered. So is sunsure what else to take. Is unable to answer the phone at work so if she can get a mesage left on her cell phone as to what she can take. Initial call taken by: Leanor Kail [facility],  May 10, 2010 1:50 PM  Follow-up for Phone Call        Gunnar Fusi what can she take in place of Tylenol or Ibuprofen? Follow-up by: Merri Ray CMA Duncan Dull),  May 10, 2010 2:22 PM  Additional Follow-up for Phone Call Additional follow up Details #1::        Left message 05/10/10 that patient could take Aleve for headache but should stop Flagyl if headaches too bothersome. We can then try Xifaxan. Additional Follow-up by: Willette Cluster NP,  May 13, 2010 3:04 PM

## 2010-10-29 NOTE — Progress Notes (Signed)
Summary: relpax  Phone Note Refill Request Call back at Home Phone [phone] Message from:  Patient on December 26, 2009 4:28 PM  Refills Requested: Medication #1:  RELPAX 40 MG TABS 1 at onset of headache [facility] [address]   Initial call taken by: Melody Comas,  December 26, 2009 4:29 PM Caller: Patient Call For: Judith Part MD  Follow-up for Phone Call        px written on EMR for call in  Follow-up by: Judith Part MD,  December 26, 2009 5:08 PM  Additional Follow-up for Phone Call Additional follow up Details #1::        Patient notified as instructed by telephone. Medication phoned to [facility] [address]  pharmacy as instructed. Lewanda Rife LPN  December 26, 2009 5:16 PM     New/Updated Medications: RELPAX 40 MG TABS (ELETRIPTAN HYDROBROMIDE) 1 at onset of headache,, repeat in 2 hrs if not resolved [BMN] Prescriptions: RELPAX 40 MG TABS (ELETRIPTAN HYDROBROMIDE) 1 at onset of headache,, repeat in 2 hrs if not resolved Brand medically necessary #12 x 5   Entered and Authorized by:   Judith Part MD   Signed by:   Lewanda Rife LPN on 25/95/6387   Method used:   Telephoned to ...       [facility] [address] (retail)       [address]       [place], [place]  [address]       Ph: [phone]       Fax: [phone]   RxID:   [id number]

## 2010-10-31 NOTE — Assessment & Plan Note (Signed)
Summary: ? YEAST INFECTION, ? UTI   Vital Signs:  Patient profile:   45 year old female Weight:      213.75 pounds BMI:     36.82 Temp:     98.0 degrees F oral Pulse rate:   92 / minute Pulse rhythm:   regular BP sitting:   126 / 80  (left arm) Cuff size:   large  Vitals Entered By: Selena Batten Dance CMA Duncan Dull) (October 16, 2010 3:30 PM) CC: ? UTI/yeast infection   History of Present Illness: pap smear came back with yeast  was called in diflucan --and took it  some burn/ itch going on since oct  got worse after diflucan - but feels some better today -- thinks she took it thurs or fri  less burning today -- no longer hurts to urinate but still itchy  no discharge   tends to be worse with symptoms rignt before peroid   is due to start any time   is also diabetic   urine burning -- when it hits a sensitive area   sunday / monday -- more burning from the inside and this is different    is urinating frequently - thinks from drinking a lot of water does drink one soda per day     urine is pos for glucose  has not checked it today   Allergies (verified): No Known Drug Allergies  Past History:  Past Medical History: Last updated: 10/02/2010 HYPERTENSION (ICD-401.9) HYPERLIPIDEMIA (ICD-272.4) DIABETES MELLITUS, TYPE II, CONTROLLED (ICD-250.00) PALPITATIONS (ICD-785.1) OBESITY (ICD-278.00) COMMON MIGRAINE (ICD-346.10) VERTIGO (ICD-780.4) plantar fasciitis    endocrine   Past Surgical History: Last updated: 02/14/2009 Bladder tack after her 2nd delivery in 1990 Tonsillectomy  1988  Family History: Last updated: 24-Nov-2009 Father: died at 33--MI due to alolergic reaction to pen. Mother: thyroidectomy--cause not known, RA, HBP, ? drug addiction, depression  Siblings: 3 sisters--1 with thyroid cancer, reoccured last yr(2008), depression                                1--twin to pt--L&W, depression                                 1--L&W, depression  1 br--drug addiction DM- 0 MI- 0  CVA- MGM Prostate Cancer-0 Breast Cancer-0 Ovarian Cancer-0 Uterine Cancer-0 Colon Cancer-0 Drug/ ETOH Abuse- 0 Depression- mother's side MGM--Alzheimers  Family History of Ulcerative Colitis: Son  Social History: Last updated: November 24, 2009 Occupation: school sysem front office  Married children 2 boys Never Smoked Alcohol use-no Regular exercise-no  Risk Factors: Caffeine Use: 0 (04/18/2008) Exercise: no (11/13/2007)  Risk Factors: Smoking Status: never (11/13/2007) Passive Smoke Exposure: no (04/18/2008)  Review of Systems General:  Denies chills, fatigue, fever, loss of appetite, and malaise. CV:  Denies chest pain or discomfort and palpitations. GI:  Denies abdominal pain, nausea, and vomiting. GU:  Complains of dysuria and urinary frequency; denies discharge, hematuria, and urinary hesitancy. MS:  Denies cramps. Derm:  Complains of itching; denies rash. Heme:  Denies abnormal bruising and bleeding.  Physical Exam  General:  overweight but generally well appearing  Head:  normocephalic, atraumatic, and no abnormalities observed.   Eyes:  vision grossly intact, pupils equal, pupils round, and pupils reactive to light.  no conjunctival pallor, injection or icterus  Mouth:  pharynx pink and moist.   Neck:  supple with full rom and no masses or thyromegally, no JVD or carotid bruit  Lungs:  Normal respiratory effort, chest expands symmetrically. Lungs are clear to auscultation, no crackles or wheezes. Heart:  Normal rate and regular rhythm. S1 and S2 normal without gallop, murmur, click, rub or other extra sounds. Abdomen:  Bowel sounds positive,abdomen soft and non-tender without masses, organomegaly or hernias noted. no suprapubic tenderness or fullness felt  Genitalia:  normal introitus, no external lesions, no vaginal discharge, and mucosa pink and moist mild hyperemia of labia minora  .   Msk:  no CVA tenderness  Skin:  Intact  without suspicious lesions or rashes Cervical Nodes:  No lymphadenopathy noted Inguinal Nodes:  No significant adenopathy Psych:  normal affect, talkative and pleasant    Impression & Recommendations:  Problem # 1:  UTI (ICD-599.0) Assessment New tx with cipro and update  ucx pending  pt advised to update me if symptoms worsen or do not improve  Her updated medication list for this problem includes:    Cipro 250 Mg Tabs (Ciprofloxacin hcl) .Marland Kitchen... 1 by mouth two times a day for 5 days  Orders: T-Culture, Urine ([id number]) Specimen Handling (82956) UA Dipstick W/ Micro (manual) (81000)  Problem # 2:  VAGINITIS, CANDIDAL (ICD-112.1) Assessment: New  with some imp s/p diflucan ? if recurrent with DM trial of terazol 3 cream and update  The following medications were removed from the medication list:    Diflucan 150 Mg Tabs (Fluconazole) .Marland Kitchen... 1 by mouth times one for yeast infection  Orders: Wet Prep (21308MV)  Complete Medication List: 1)  Aleve 220 Mg Tabs (Naproxen sodium) .... Two tablets by mouth once daily as needed 2)  Relpax 40 Mg Tabs (Eletriptan hydrobromide) .Marland Kitchen.. 1 at onset of headache,, repeat in 2 hrs if not resolved 3)  Metformin Hcl 500 Mg Xr24h-tab (Metformin hcl) .... 2 tabs by mouth each evening 4)  Lisinopril 20 Mg Tabs (Lisinopril) .... One tablet by mouth once daily 5)  Prevacid 24hr 15 Mg Cpdr (Lansoprazole) .... One by mouth once daily 6)  Aspirin 81 Mg Tabs (Aspirin) .... One daily 7)  Zyrtec Hives Relief 10 Mg Tabs (Cetirizine hcl) .... Take 1 tablet by mouth once a day 8)  Amaryl 1 Mg Tabs (Glimepiride) .... Take 1 tablet by mouth once a day 9)  Onetouch Ultra Test Strp (Glucose blood) .... Test once daily or as needed. dm 250.00 10)  Levsin/sl 0.125 Mg Subl (Hyoscyamine sulfate) .Marland Kitchen.. 1 pill under toungue up to every 6 hours for abdominal cramps 11)  Vitamin D3 1000 Unit Tabs (Cholecalciferol) .Marland Kitchen.. 1 by mouth once daily 12)  Zetia 10 Mg Tabs  (Ezetimibe) .Marland Kitchen.. 1 by mouth once daily 13)  Terazol 3 0.8 % Crea (Terconazole) .... Insert 1 applicator intravaginally at bedtime for 3 days  you can also use some externally for itch 14)  Cipro 250 Mg Tabs (Ciprofloxacin hcl) .Marland Kitchen.. 1 by mouth two times a day for 5 days  Patient Instructions: 1)  take the cipro for urine infection 2)  use terazol for yeast infection  3)  update me if your symptoms do not improve  4)  check your sugar when you get home  Prescriptions: CIPRO 250 MG TABS (CIPROFLOXACIN HCL) 1 by mouth two times a day for 5 days  #10 x 0   Entered and Authorized by:   Judith Part MD   Signed by:   Judith Part MD on 10/16/2010  Method used:   Electronically to        [facility]  [address] (retail)       [address], [address]       [place], [place]  [address]       Ph: [phone]       Fax: [phone]   RxID:   [id number] TERAZOL 3 0.8 % CREA (TERCONAZOLE) insert 1 applicator intravaginally at bedtime for 3 days  you can also use some externally for itch  #1 course x 0   Entered and Authorized by:   Judith Part MD   Signed by:   Judith Part MD on 10/16/2010   Method used:   Electronically to        [facility]  [address] (retail)       [address], [address]       [place], [place]  [address]       Ph: [phone]       Fax: [phone]   RxID:   [id number]    Orders Added: 1)  T-Culture, Urine [[id number]] 2)  Specimen Handling [99000] 3)  UA Dipstick W/ Micro (manual) [81000] 4)  Wet Prep [87210QW] 5)  Est. Patient Level IV [62831]    Current Allergies (reviewed today): No known allergies   Laboratory Results   Urine Tests  Date/Time Received: October 16, 2010 3:31 PM  Date/Time Reported: October 16, 2010 3:31 PM   Routine Urinalysis   Color: lt. yellow Appearance: Hazy Glucose: 2000+   (Normal Range: Negative) Bilirubin: negative   (Normal Range:  Negative) Ketone: negative   (Normal Range: Negative) Spec. Gravity: >=1.030   (Normal Range: 1.003-1.035) Blood: moderate   (Normal Range: Negative) pH: 6.0   (Normal Range: 5.0-8.0) Protein: negative   (Normal Range: Negative) Urobilinogen: 0.2   (Normal Range: 0-1) Nitrite: negative   (Normal Range: Negative) Leukocyte Esterace: negative   (Normal Range: Negative)  Urine Microscopic WBC/HPF: 3-4 RBC/HPF: 0-1 Bacteria/HPF: few Mucous/HPF: few Epithelial/HPF: 1-3 Crystals/HPF: 0 Casts/LPF: 0 Yeast/HPF: 0 Other: 0      Wet Mount Source: vaginal WBC/hpf: 1-5 Bacteria/hpf: rare  Rods Clue cells/hpf: none  Negative whiff Yeast/hpf: few Wet [place] KOH: Negative Trichomonas/hpf: none

## 2010-10-31 NOTE — Assessment & Plan Note (Signed)
Summary: REFER FRM DR. TOWER FOR PLANTAR FASCIITIS / LFW   Vital Signs:  Patient profile:   45 year old female Height:      64 inches Weight:      215.50 pounds BMI:     37.12 Temp:     98.2 degrees F oral Pulse rate:   84 / minute Pulse rhythm:   regular BP sitting:   130 / 70  (left arm) Cuff size:   large  Vitals Entered By: Benny Lennert CMA Duncan Dull) (October 07, 2010 3:52 PM)  History of Present Illness: Chief complaint Plantar fascitis  Dr. Milinda Antis has requested a consult for evaluation of foot pain:  For two years, pain medially, pain with standing up and walking and going to the grocery store. A trip to [employer] is pretty painful.   Changed footwear within the last few days to [facility] Was wearing flats then starting wearing. Has been wearing Nike's  12 years ago in R foot Last April had B injections Saw Dr. Corliss Skains. Multple diffuse pains. Saw a podiatrist. Unsure of who.  Some type of orthotisis, not an orthotic, but what sounds like an AFO type brace Night splints - no help  Massage - at night, husband will help.  Literally has had to crawl at times to avoid putting pressure on her feet.  Diplomatic Services operational officer at [employer].   Allergies (verified): No Known Drug Allergies  Past History:  Past medical, surgical, family and social histories (including risk factors) reviewed, and no changes noted (except as noted below).  Past Medical History: Reviewed history from 10/02/2010 and no changes required. HYPERTENSION (ICD-401.9) HYPERLIPIDEMIA (ICD-272.4) DIABETES MELLITUS, TYPE II, CONTROLLED (ICD-250.00) PALPITATIONS (ICD-785.1) OBESITY (ICD-278.00) COMMON MIGRAINE (ICD-346.10) VERTIGO (ICD-780.4) plantar fasciitis    endocrine   Past Surgical History: Reviewed history from 02/14/2009 and no changes required. Bladder tack after her 2nd delivery in 1990 Tonsillectomy  1988  Family History: Reviewed history from 10/25/2009 and no changes required. Father:  died at 33--MI due to alolergic reaction to pen. Mother: thyroidectomy--cause not known, RA, HBP, ? drug addiction, depression  Siblings: 3 sisters--1 with thyroid cancer, reoccured last yr(2008), depression                                1--twin to pt--L&W, depression                                 1--L&W, depression               1 br--drug addiction DM- 0 MI- 0  CVA- MGM Prostate Cancer-0 Breast Cancer-0 Ovarian Cancer-0 Uterine Cancer-0 Colon Cancer-0 Drug/ ETOH Abuse- 0 Depression- mother's side MGM--Alzheimers  Family History of Ulcerative Colitis: Son  Social History: Reviewed history from 10/25/2009 and no changes required. Occupation: school sysem front office  Married children 2 boys Never Smoked Alcohol use-no Regular exercise-no  Review of Systems       REVIEW OF SYSTEMS  GEN: No systemic complaints, no fevers, chills, sweats, or other acute illnesses MSK: Detailed in the HPI GI: tolerating PO intake without difficulty Neuro: No numbness, parasthesias, or tingling associated. Otherwise the pertinent positives of the ROS are noted above.    Physical Exam  General:  GEN: Well-developed,well-nourished,in no acute distress; alert,appropriate and cooperative throughout examination HEENT: Normocephalic and atraumatic without obvious abnormalities. No apparent alopecia or balding. Ears,  externally no deformities PULM: Breathing comfortably in no respiratory distress EXT: No clubbing, cyanosis, or edema PSYCH: Normally interactive. Cooperative during the interview. Pleasant. Friendly and conversant. Not anxious or depressed appearing. Normal, full affect.  Msk:  Echymosis: no Edema: no ROM: full LE B Gait: heel toe, non-antalgic MT pain: no Callus pattern: none Lateral Mall: NT Medial Mall: NT Talus: NT Navicular: NT Calcaneous: NT Metatarsals: NT 5th MT: NT Phalanges: NT Achilles: NT Plantar Fascia: tender, medial along PF. Pain with forced dorsi Fat  Pad: NT Peroneals: NT Post Tib: NT Great Toe: Nml motion Ant Drawer: neg Other foot breakdown: none Long arch: moderate breakdown Transverse arch: mild breakdown Hindfoot breakdown: none Sensation: intact    Impression & Recommendations:  Problem # 1:  PLANTAR FASCIITIS (ICD-728.71) >45 minutes spent in total face to face time with the patient with >50% of time spent in counselling and coordination of care: classic plantar fasciitis. The patient says symptoms now ongoing continuously for greater than 2 years. I spent an extensive amount of time discussing plantar fasciitis, the anatomy of all, and everything about his condition with the patient. She numerous questions. We discussed its care conservative and surgical.  Recommendations: Add Tuli's heel cups Add arch binders. Continue with current rigid shoe. Review stretching and strengthening program for the [employer] Academy of foot and ankle surgeons. Toe crunches daily. Foot strengthening daily. Massage daily.  If no improvement in 4-6 weeks, I will make her a plastazote orthotic, which has been shown with Cochrane gold level evidence to be of benefit in PF.  We also discussed that surgical release is not unreasonable and worth consideration.  cc: Dr. Milinda Antis  Her updated medication list for this problem includes:    Aleve 220 Mg Tabs (Naproxen sodium) .Marland Kitchen..Marland Kitchen Two tablets by mouth once daily as needed  Complete Medication List: 1)  Aleve 220 Mg Tabs (Naproxen sodium) .... Two tablets by mouth once daily as needed 2)  Relpax 40 Mg Tabs (Eletriptan hydrobromide) .Marland Kitchen.. 1 at onset of headache,, repeat in 2 hrs if not resolved 3)  Metformin Hcl 500 Mg Xr24h-tab (Metformin hcl) .... 2 tabs by mouth each evening 4)  Lisinopril 20 Mg Tabs (Lisinopril) .... One tablet by mouth once daily 5)  Prevacid 24hr 15 Mg Cpdr (Lansoprazole) .... One by mouth once daily 6)  Aspirin 81 Mg Tabs (Aspirin) .... One daily 7)  Zyrtec Hives Relief 10 Mg Tabs  (Cetirizine hcl) .... Take 1 tablet by mouth once a day 8)  Amaryl 1 Mg Tabs (Glimepiride) .... Take 1 tablet by mouth once a day 9)  Onetouch Ultra Test Strp (Glucose blood) .... Test once daily or as needed. dm 250.00 10)  Levsin/sl 0.125 Mg Subl (Hyoscyamine sulfate) .Marland Kitchen.. 1 pill under toungue up to every 6 hours for abdominal cramps 11)  Vitamin D3 1000 Unit Tabs (Cholecalciferol) .Marland Kitchen.. 1 by mouth once daily 12)  Zetia 10 Mg Tabs (Ezetimibe) .Marland Kitchen.. 1 by mouth once daily   Orders Added: 1)  Consultation Level IV [13086]    Current Allergies (reviewed today): No known allergies

## 2010-10-31 NOTE — Assessment & Plan Note (Signed)
Summary: CPX/JRR   Vital Signs:  Patient profile:   45 year old female Height:      64 inches Weight:      214 pounds BMI:     36.87 Temp:     97.8 degrees F oral Pulse rate:   84 / minute Pulse rhythm:   regular BP sitting:   150 / 98  (left arm) Cuff size:   regular  Vitals Entered By: Lewanda Rife LPN (October 02, 2010 9:52 AM) CC: CPX with pap and breast exam  LMP 09/19/10   History of Present Illness: here for health mt exam and to review chronic med problems   is feeling fair -- good days and bad days   is having a lot of problems with plantar fasciitis in both feet  went to foot doctor- gave her braces  has had injections in april -- short term relief only  may be interested in visit with Dr Patsy Lager  makes it hard to exercise   also has chronic tailbone pain and difficulty with sitting  did see GI -- not a GI problem had a fall when she was pregnant   wt is up 8 lb with bmi of 36-- from above problem - cannot exercise  eating has not changed   HTN 150/98--up- - did take her med last night  she checks it [facility] -- and is usually great -- last 107/71  is better when she is not working (started back to work yesterday)  does not take [employer] regulary  pap 09 -- no abn paps  last 3 have been normal  wants to do this today no symptoms  still having menses very regularly  no need for birth control   mam 09 self exam-- no lumps or changes   Td 2011 no flu shots    D level 35 was 72 in summer    lipids high with trig 206 and HDL 39 and LDL 182.9 crestor did make her liver enzymes go up  no fried foods or shellfish or red meat  some bacon occas , not a lot of cheese     DM --stopped seeing Dr Lucianne Muss -- [facility] is 6.2   (was 5.7 )  is stable on oral meds  is eating the same -- not eating DM diet -- has had training and knows what to eat  she in general does not eat enough  very difficult to do with time    ast /alt 40 and 56 - stable  vit D down at  35  Allergies (verified): No Known Drug Allergies  Past History:  Past Surgical History: Last updated: 02/14/2009 Bladder tack after her 2nd delivery in 1990 Tonsillectomy  1988  Family History: Last updated: November 20, 2009 Father: died at 33--MI due to alolergic reaction to pen. Mother: thyroidectomy--cause not known, RA, HBP, ? drug addiction, depression  Siblings: 3 sisters--1 with thyroid cancer, reoccured last yr(2008), depression                                1--twin to pt--L&W, depression                                 1--L&W, depression               1 br--drug addiction DM- 0 MI- 0  CVA- MGM Prostate Cancer-0  Breast Cancer-0 Ovarian Cancer-0 Uterine Cancer-0 Colon Cancer-0 Drug/ ETOH Abuse- 0 Depression- mother's side MGM--Alzheimers  Family History of Ulcerative Colitis: Son  Social History: Last updated: 10/25/2009 Occupation: school sysem front office  Married children 2 boys Never Smoked Alcohol use-no Regular exercise-no  Risk Factors: Caffeine Use: 0 (04/18/2008) Exercise: no (11/13/2007)  Risk Factors: Smoking Status: never (11/13/2007) Passive Smoke Exposure: no (04/18/2008)  Past Medical History: HYPERTENSION (ICD-401.9) HYPERLIPIDEMIA (ICD-272.4) DIABETES MELLITUS, TYPE II, CONTROLLED (ICD-250.00) PALPITATIONS (ICD-785.1) OBESITY (ICD-278.00) COMMON MIGRAINE (ICD-346.10) VERTIGO (ICD-780.4) plantar fasciitis    endocrine   Review of Systems General:  Complains of fatigue; denies chills, fever, loss of appetite, and malaise. Eyes:  Denies blurring and eye irritation. CV:  Denies chest pain or discomfort, lightheadness, and palpitations. Resp:  Denies cough, pleuritic, and shortness of breath. GI:  Denies abdominal pain, change in bowel habits, and indigestion. GU:  Denies abnormal vaginal bleeding, discharge, dysuria, and urinary frequency. MS:  Complains of joint pain and stiffness; denies joint redness, joint swelling, and muscle  weakness. Derm:  Complains of lesion(s); denies itching, poor wound healing, and rash; has skin tag she wants removed . Neuro:  Denies numbness, tingling, and weakness. Psych:  mood is ok . Endo:  Denies cold intolerance, excessive thirst, excessive urination, and heat intolerance. Heme:  Denies abnormal bruising and bleeding.  Physical Exam  General:  overweight but generally well appearing   Head:  normocephalic, atraumatic, and no abnormalities observed.   Eyes:  vision grossly intact, pupils equal, pupils round, and pupils reactive to light.  no conjunctival pallor, injection or icterus  Ears:  R ear normal and L ear normal.   Nose:  no nasal discharge.   Mouth:  pharynx pink and moist.   Neck:  supple with full rom and no masses or thyromegally, no JVD or carotid bruit  Chest Wall:  No deformities, masses, or tenderness noted. Breasts:  No mass, nodules, thickening, tenderness, bulging, retraction, inflamation, nipple discharge or skin changes noted.   Lungs:  Normal respiratory effort, chest expands symmetrically. Lungs are clear to auscultation, no crackles or wheezes. Heart:  Normal rate and regular rhythm. S1 and S2 normal without gallop, murmur, click, rub or other extra sounds. Abdomen:  Bowel sounds positive,abdomen soft and non-tender without masses, organomegaly or hernias noted. no renal bruits  Genitalia:  Normal introitus for age, no external lesions, no vaginal discharge, mucosa pink and moist, no vaginal or cervical lesions, no vaginal atrophy, no friaility or hemorrhage, normal uterus size and position, no adnexal masses or tenderness Msk:  No deformity or scoliosis noted of thoracic or lumbar spine.  diffuse tenderness on bottom of feet  Pulses:  R and L carotid,radial,femoral,dorsalis pedis and posterior tibial pulses are full and equal bilaterally Extremities:  No clubbing, cyanosis, edema, or deformity noted with normal full range of motion of all joints.     Neurologic:  sensation intact to light touch, gait normal, and DTRs symmetrical and normal.   Skin:  Intact without suspicious lesions or rashes Cervical Nodes:  No lymphadenopathy noted Axillary Nodes:  No palpable lymphadenopathy Inguinal Nodes:  No significant adenopathy Psych:  normal affect, talkative and pleasant   Diabetes Management Exam:    Foot Exam (with socks and/or shoes not present):       Sensory-Pinprick/Light touch:          Left medial foot (L-4): normal          Left dorsal foot (L-5): normal  Left lateral foot (S-1): normal          Right medial foot (L-4): normal          Right dorsal foot (L-5): normal          Right lateral foot (S-1): normal       Sensory-Monofilament:          Left foot: normal          Right foot: normal       Inspection:          Left foot: normal          Right foot: normal       Nails:          Left foot: normal          Right foot: normal   Impression & Recommendations:  Problem # 1:  HEALTH MAINTENANCE EXAM (ICD-V70.0) Assessment Comment Only  reviewed health habits including diet, exercise and skin cancer prevention reviewed health maintenance list and family history reviewed wellness labs in detail   Orders: Prescription Created Electronically [phone])  Problem # 2:  UNSPECIFIED VITAMIN D DEFICIENCY (ICD-268.9) Assessment: Deteriorated  this is down off vit D alltogether  disc impt of this for bone and general health wlll start 1000 international units daily and continue to monitor   Orders: Prescription Created Electronically [phone])  Problem # 3:  HYPERTENSION (ICD-401.9) Assessment: Deteriorated  bp is up but great at home  will conitnue to watch this and re check at 2 mo f/u- bring cuff  Her updated medication list for this problem includes:    Lisinopril 20 Mg Tabs (Lisinopril) ..... One tablet by mouth once daily  BP today: 150/98 Prior BP: 118/64 (05/24/2010)  Labs Reviewed: K+: 4.7  (09/27/2010) Creat: : 0.8 (09/27/2010)   Chol: 243 (09/27/2010)   HDL: 39.40 (09/27/2010)   LDL: DEL (04/24/2008)   TG: 206.0 (09/27/2010)  Orders: Prescription Created Electronically [phone])  Problem # 4:  HYPERLIPIDEMIA (ICD-272.4) Assessment: Deteriorated  trial of zetia since not tolerant of statins rev in detail low sat fat diet  planned labs to re check   Her updated medication list for this problem includes:    Zetia 10 Mg Tabs (Ezetimibe) .Marland Kitchen... 1 by mouth once daily  Orders: Prescription Created Electronically [phone])  Problem # 5:  DIABETES MELLITUS, TYPE II, CONTROLLED (ICD-250.00) Assessment: Unchanged  a little worse than last time but overall good control is back off the holiday eating  no change in med  Her updated medication list for this problem includes:    Metformin Hcl 500 Mg Xr24h-tab (Metformin hcl) .Marland Kitchen... 2 tabs by mouth each evening    Lisinopril 20 Mg Tabs (Lisinopril) ..... One tablet by mouth once daily    Aspirin 81 Mg Tabs (Aspirin) ..... One daily    Amaryl 1 Mg Tabs (Glimepiride) .Marland Kitchen... Take 1 tablet by mouth once a day  Labs Reviewed: Creat: 0.8 (09/27/2010)    Reviewed HgBA1c results: 6.2 (09/27/2010)  6.4 (08/22/2009)  Orders: Prescription Created Electronically [phone])  Problem # 6:  ROUTINE GYNECOLOGICAL EXAMINATION (ICD-V72.31) Assessment: Comment Only  exam -- 3 year with pap done no complaints  Orders: Prescription Created Electronically [phone])  Problem # 7:  PLANTAR FASCIITIS (ICD-728.71) Assessment: New  is restricting her exercise and impacting quality of life  wants new opinion - will make appt with Dr Patsy Lager  Her updated medication list for this problem includes:    Aleve 220 Mg Tabs (Naproxen sodium) .Marland KitchenMarland KitchenMarland KitchenMarland Kitchen  Two tablets by mouth once daily as needed  Orders: Prescription Created Electronically [phone])  Complete Medication List: 1)  Aleve 220 Mg Tabs (Naproxen sodium) .... Two tablets by mouth once daily as  needed 2)  Relpax 40 Mg Tabs (Eletriptan hydrobromide) .Marland Kitchen.. 1 at onset of headache,, repeat in 2 hrs if not resolved 3)  Metformin Hcl 500 Mg Xr24h-tab (Metformin hcl) .... 2 tabs by mouth each evening 4)  Lisinopril 20 Mg Tabs (Lisinopril) .... One tablet by mouth once daily 5)  Prevacid 24hr 15 Mg Cpdr (Lansoprazole) .... One by mouth once daily 6)  Aspirin 81 Mg Tabs (Aspirin) .... One daily 7)  Zyrtec Hives Relief 10 Mg Tabs (Cetirizine hcl) .... Take 1 tablet by mouth once a day 8)  Amaryl 1 Mg Tabs (Glimepiride) .... Take 1 tablet by mouth once a day 9)  Onetouch Ultra Test Strp (Glucose blood) .... Test once daily or as needed. dm 250.00 10)  Levsin/sl 0.125 Mg Subl (Hyoscyamine sulfate) .Marland Kitchen.. 1 pill under toungue up to every 6 hours for abdominal cramps 11)  Vitamin D3 1000 Unit Tabs (Cholecalciferol) .Marland Kitchen.. 1 by mouth once daily 12)  Zetia 10 Mg Tabs (Ezetimibe) .Marland Kitchen.. 1 by mouth once daily  Other Orders: Radiology Referral (Radiology)  Patient Instructions: 1)  start taking 1000 international units of vit d 3 daily  2)  you can raise your HDL (good cholesterol) by increasing exercise and eating omega 3 fatty acid supplement like fish oil or flax seed oil over the counter 3)  you can lower LDL (bad cholesterol) by limiting saturated fats in diet like red meat, fried foods, egg yolks, fatty breakfast meats, high fat dairy products and shellfish  4)  if you are interested in seeing Dr Patsy Lager for plantar fasciitis call back and ask for appt with him  5)  I advise starting a water exercise routine  6)  we will schedule mammogram at check out 7)  start zetia 10 mg daily for cholesterol  8)  schedule fasting labs in 2 months lipid/ast/alt 272 and then follow up (will do skin tag removal then) -- please 30 minute visit  Prescriptions: ZETIA 10 MG TABS (EZETIMIBE) 1 by mouth once daily  #30 x 11   Entered and Authorized by:   Judith Part MD   Signed by:   Judith Part MD on  10/02/2010   Method used:   Electronically to        [facility] [address] (retail)       [address], [address]       [place], [place]  [address]       Ph: [phone]       Fax: [phone]   RxID:   [id number] ZYRTEC HIVES RELIEF 10 MG TABS (CETIRIZINE HCL) Take 1 tablet by mouth once a day  #30 x 11   Entered and Authorized by:   Judith Part MD   Signed by:   Judith Part MD on 10/02/2010   Method used:   Electronically to        [facility] [address] (retail)       [address], [address]       [place], [place]  [address]       Ph: [phone]       Fax: [phone]   RxID:   [id number]  AMARYL 1 MG TABS (GLIMEPIRIDE) Take 1 tablet by mouth once a day  #30 x 11   Entered and Authorized by:   Judith Part MD   Signed by:   Judith Part MD on 10/02/2010   Method used:   Electronically to        [facility] [address] (retail)       [address], [address]       [place], [place]  [address]       Ph: [phone]       Fax: [phone]   RxID:   [id number] PREVACID 24HR 15 MG CPDR (LANSOPRAZOLE) one by mouth once daily  #30 x 11   Entered and Authorized by:   Judith Part MD   Signed by:   Judith Part MD on 10/02/2010   Method used:   Electronically to        [facility] [address] (retail)       [address], [address]       [place], [place]  [address]       Ph: [phone]       Fax: [phone]   RxID:   [id number] LISINOPRIL 20 MG TABS (LISINOPRIL) one tablet by mouth once daily  #30 x 11   Entered and Authorized by:   Judith Part MD   Signed by:   Judith Part MD on 10/02/2010   Method used:   Electronically to        [facility] [address] (retail)       [address]       [place], [place]  [address]       Ph: [phone]       Fax: [phone]    RxID:   [id number] RELPAX 40 MG TABS (ELETRIPTAN HYDROBROMIDE) 1 at onset of headache,, repeat in 2 hrs if not resolved Brand medically necessary #12 x 11   Entered and Authorized by:   Judith Part MD   Signed by:   Judith Part MD on 10/02/2010   Method used:   Electronically to        [facility] [address] (retail)       [address], [address]       [place], [place]  [address]       Ph: [phone]       Fax: [phone]   RxID:   [id number] METFORMIN HCL 500 MG XR24H-TAB (METFORMIN HCL) 2 tabs by mouth each evening  #60 x 11   Entered and Authorized by:   Judith Part MD   Signed by:   Judith Part MD on 10/02/2010   Method used:   Electronically to        [facility] [address] (retail)       [address], [address]       [place], [place]  [address]       Ph: [phone]       Fax: [phone]   RxID:   [id number]    Orders Added: 1)  Radiology Referral [Radiology] 2)  Prescription Created Electronically [G8553] 3)  Est. Patient 40-64 years [phone]  Current Allergies (reviewed today): No known allergies

## 2010-11-05 ENCOUNTER — Encounter (INDEPENDENT_AMBULATORY_CARE_PROVIDER_SITE_OTHER): Payer: Self-pay | Admitting: *Deleted

## 2010-11-14 NOTE — Letter (Signed)
Summary: Results Follow up Letter  [place] [facility]  [address] [place], [place] [address]   Phone: [phone]  Fax: [phone]    11/05/2010 MRN: [id number]  [facility] [address] [place], [place]  [address]  Dear Ms. [facility],  The following are the results of your recent test(s):  Test         Result    Pap Smear:        Normal _____  Not Normal _____ Comments: ______________________________________________________ Cholesterol: LDL(Bad cholesterol):         Your goal is less than:         HDL (Good cholesterol):       Your goal is more than: Comments:  ______________________________________________________ Mammogram:        Normal __X___  Not Normal _____ Comments: Repeat in 1 year  ___________________________________________________________________ Hemoccult:        Normal _____  Not normal _______ Comments:    _____________________________________________________________________ Other Tests:    We routinely do not discuss normal results over the telephone.  If you desire a copy of the results, or you have any questions about this information we can discuss them at your next office visit.   Sincerely,      Sharilyn Sites for  Dr. Roxy Manns

## 2010-11-14 NOTE — Miscellaneous (Signed)
Summary: Mammogram to flowsheet  Clinical Lists Changes  Observations: Added new observation of MAMMO DUE: 10/2011 (11/05/2010 9:33) Added new observation of MAMMOGRAM: normal (10/28/2010 9:33)      Preventive Care Screening  Mammogram:    Date:  10/28/2010    Next Due:  10/2011    Results:  normal

## 2010-11-26 ENCOUNTER — Ambulatory Visit (INDEPENDENT_AMBULATORY_CARE_PROVIDER_SITE_OTHER): Payer: BC Managed Care – PPO | Admitting: Family Medicine

## 2010-11-26 ENCOUNTER — Encounter: Payer: Self-pay | Admitting: Family Medicine

## 2010-11-26 DIAGNOSIS — J069 Acute upper respiratory infection, unspecified: Secondary | ICD-10-CM

## 2010-11-26 LAB — CONVERTED CEMR LAB: Rapid Strep: NEGATIVE

## 2010-12-02 ENCOUNTER — Other Ambulatory Visit: Payer: Self-pay

## 2010-12-05 NOTE — Letter (Signed)
Summary: Out of Work  [facility]  [address] [place], [place] [address]   Phone: [phone]  Fax: [phone]    November 26, 2010   Employee:  Stacy Monroe    To Whom It May Concern:   For Medical reasons, please excuse the above named employee from work for the following dates:  Start:   11/26/2010  End:   can return 3/2/ 2012 if she is feeling better   If you need additional information, please feel free to contact our office.         Sincerely,    Judith Part MD

## 2010-12-05 NOTE — Assessment & Plan Note (Signed)
Summary: strep???   Vital Signs:  Patient profile:   45 year old female Height:      64 inches Weight:      213.25 pounds BMI:     36.74 Temp:     98 degrees F oral Pulse rate:   84 / minute Pulse rhythm:   regular BP sitting:   124 / 80  (left arm) Cuff size:   large  Vitals Entered By: Lewanda Rife LPN (November 26, 2010 3:30 PM) CC: ? strep sorethroat,very painful, ha/ both ears hurt and nonporductive cough.   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   no chills or aches   has not taken anything for sore throat     Allergies (verified): No Known Drug Allergies  Past History:  Past Medical History: Last updated: 10/02/2010 HYPERTENSION (ICD-401.9) HYPERLIPIDEMIA (ICD-272.4) DIABETES MELLITUS, TYPE II, CONTROLLED (ICD-250.00) PALPITATIONS (ICD-785.1) OBESITY (ICD-278.00) COMMON MIGRAINE (ICD-346.10) VERTIGO (ICD-780.4) plantar fasciitis    endocrine   Past Surgical History: Last updated: 02/14/2009 Bladder tack after her 2nd delivery in 1990 Tonsillectomy  1988  Family History: Last updated: 11-16-09 Father: died at 33--MI due to alolergic reaction to pen. Mother: thyroidectomy--cause not known, RA, HBP, ? drug addiction, depression  Siblings: 3 sisters--1 with thyroid cancer, reoccured last yr(2008), depression                                1--twin to pt--L&W, depression                                 1--L&W, depression               1 br--drug addiction DM- 0 MI- 0  CVA- MGM Prostate Cancer-0 Breast Cancer-0 Ovarian Cancer-0 Uterine Cancer-0 Colon  Cancer-0 Drug/ ETOH Abuse- 0 Depression- mother's side MGM--Alzheimers  Family History of Ulcerative Colitis: Son  Social History: Last updated: 16-Nov-2009 Occupation: school sysem front office  Married children 2 boys Never Smoked Alcohol use-no Regular exercise-no  Risk Factors: Caffeine Use: 0 (04/18/2008) Exercise: no (11/13/2007)  Risk Factors: Smoking Status: never (11/13/2007) Passive Smoke Exposure: no (04/18/2008)  Review of Systems General:  Complains of fatigue and malaise; denies chills and fever. Eyes:  Denies blurring and eye irritation. ENT:  Complains of earache, nasal congestion, postnasal drainage, and sore throat; denies ear discharge. CV:  Denies chest pain or discomfort and palpitations. Resp:  Denies pleuritic, shortness of breath, sputum productive, and wheezing. GI:  Denies nausea and vomiting. Derm:  Denies rash. Heme:  Denies abnormal bruising and bleeding.  Physical Exam  General:  overweight but generally well appearing  Head:  normocephalic, atraumatic, and no abnormalities observed.  no sinus tenderness Eyes:  vision grossly intact, pupils equal, pupils round, pupils reactive to light, and no injection.   Ears:  TMs are dull with small effusions  Nose:  mild congestion bilat  Mouth:  post injection with scant post nasal drip no swelling or exudate Neck:  supple with full rom and no masses or thyromegally, no JVD or carotid bruit  Lungs:  Normal respiratory effort, chest expands symmetrically. Lungs are clear to auscultation, no crackles or wheezes. occ harsh hacky cough Heart:  Normal rate and regular rhythm. S1 and S2 normal without gallop, murmur, click, rub or other extra sounds. Msk:  no acute joint changes Skin:  Intact without suspicious lesions or rashes Cervical Nodes:  No lymphadenopathy noted Psych:  normal affect, talkative and pleasant    Impression & Recommendations:  Problem # 1:  VIRAL URI (ICD-465.9) Assessment  New  with sore throat (improved since this am) , nasal congestion and cough  rapid strep neg today recommend sympt care- see pt instructions   pt advised to update me if symptoms worsen or do not improve  Her updated medication list for this problem includes:    Aleve 220 Mg Tabs (Naproxen sodium) .Marland Kitchen..Marland Kitchen Two tablets by mouth once daily as needed    Aspirin 81 Mg Tabs (Aspirin) ..... One daily    Zyrtec Hives Relief 10 Mg Tabs (Cetirizine hcl) .Marland Kitchen... Take 1 tablet by mouth once a day  Orders: Rapid Strep ([address])  Complete Medication List: 1)  Aleve 220 Mg Tabs (Naproxen sodium) .... Two tablets by mouth once daily as needed 2)  Relpax 40 Mg Tabs (Eletriptan hydrobromide) .Marland Kitchen.. 1 at onset of headache,, repeat in 2 hrs if not resolved 3)  Metformin Hcl 500 Mg Xr24h-tab (Metformin hcl) .... 2 tabs by mouth each evening 4)  Lisinopril 20 Mg Tabs (Lisinopril) .... One tablet by mouth once daily 5)  Prevacid 24hr 15 Mg Cpdr (Lansoprazole) .... One by mouth once daily 6)  Aspirin 81 Mg Tabs (Aspirin) .... One daily 7)  Zyrtec Hives Relief 10 Mg Tabs (Cetirizine hcl) .... Take 1 tablet by mouth once a day 8)  Amaryl 1 Mg Tabs (Glimepiride) .... Take 1 tablet by mouth once a day 9)  Onetouch Ultra Test Strp (Glucose blood) .... Test once daily or as needed. dm 250.00 10)  Levsin/sl 0.125 Mg Subl (Hyoscyamine sulfate) .Marland Kitchen.. 1 pill under toungue up to every 6 hours for abdominal cramps 11)  Vitamin D3 1000 Unit Tabs (Cholecalciferol) .Marland Kitchen.. 1 by mouth once daily 12)  Zetia 10 Mg Tabs (Ezetimibe) .Marland Kitchen.. 1 by mouth once daily 13)  Terazol 3 0.8 % Crea (Terconazole) .... Insert 1 applicator intravaginally at bedtime for 3 days  you can also use some externally for itch as needed. 14)  Prevacid Otc 24 Hour 15mg  Dr Charlynne Cousins  .... Take 1 capsule by mouth once a day  Patient Instructions: 1)  take aleve up to two times a day with food (stop if it bothers your stomach) 2)  drink lots of fluids 3)  chloraseptic  throat spray is good for sore throat 4)  also salt water gargles  5)  mucinex DM for congestion and cough  6)  lots of sleep  7)  out of work for 2 days - return on friday depending on how you feel    Orders Added: 1)  Rapid Strep [60454] 2)  Est. Patient Level III [[address]11]    Current Allergies (reviewed today): No known allergies     Laboratory Results  Date/Time Received: November 26, 2010 3:42 PM  Date/Time Reported: November 26, 2010 3:42 PM   Other Tests  Rapid Strep: negative

## 2010-12-06 ENCOUNTER — Other Ambulatory Visit: Payer: Self-pay

## 2010-12-09 ENCOUNTER — Ambulatory Visit: Payer: Self-pay | Admitting: Family Medicine

## 2010-12-10 ENCOUNTER — Ambulatory Visit: Payer: Self-pay | Admitting: Family Medicine

## 2011-01-11 ENCOUNTER — Encounter: Payer: Self-pay | Admitting: Family Medicine

## 2011-01-13 ENCOUNTER — Other Ambulatory Visit: Payer: Self-pay | Admitting: *Deleted

## 2011-01-13 DIAGNOSIS — E7800 Pure hypercholesterolemia, unspecified: Secondary | ICD-10-CM

## 2011-01-17 ENCOUNTER — Other Ambulatory Visit (INDEPENDENT_AMBULATORY_CARE_PROVIDER_SITE_OTHER): Payer: BC Managed Care – PPO | Admitting: Family Medicine

## 2011-01-17 DIAGNOSIS — E119 Type 2 diabetes mellitus without complications: Secondary | ICD-10-CM

## 2011-01-17 DIAGNOSIS — E7800 Pure hypercholesterolemia, unspecified: Secondary | ICD-10-CM

## 2011-01-17 LAB — LIPID PANEL: Triglycerides: 129 mg/dL (ref 0.0–149.0)

## 2011-01-17 LAB — AST: AST: 46 U/L — ABNORMAL HIGH (ref 0–37)

## 2011-01-17 LAB — HEMOGLOBIN A1C: Hgb A1c MFr Bld: 6.3 % (ref 4.6–6.5)

## 2011-01-17 NOTE — Progress Notes (Signed)
Addended by: Liane Comber on: 01/17/2011 01:10 PM   Modules accepted: Orders

## 2011-01-21 ENCOUNTER — Ambulatory Visit (INDEPENDENT_AMBULATORY_CARE_PROVIDER_SITE_OTHER): Payer: BC Managed Care – PPO | Admitting: Family Medicine

## 2011-01-21 ENCOUNTER — Encounter: Payer: Self-pay | Admitting: Family Medicine

## 2011-01-21 DIAGNOSIS — E669 Obesity, unspecified: Secondary | ICD-10-CM

## 2011-01-21 DIAGNOSIS — R5381 Other malaise: Secondary | ICD-10-CM

## 2011-01-21 DIAGNOSIS — R7401 Elevation of levels of liver transaminase levels: Secondary | ICD-10-CM

## 2011-01-21 DIAGNOSIS — E785 Hyperlipidemia, unspecified: Secondary | ICD-10-CM

## 2011-01-21 DIAGNOSIS — I10 Essential (primary) hypertension: Secondary | ICD-10-CM

## 2011-01-21 DIAGNOSIS — E119 Type 2 diabetes mellitus without complications: Secondary | ICD-10-CM

## 2011-01-21 DIAGNOSIS — R5383 Other fatigue: Secondary | ICD-10-CM | POA: Insufficient documentation

## 2011-01-21 NOTE — Assessment & Plan Note (Signed)
Improved from last visit and good at home Enc exercise if able No med change F/u 6 mo

## 2011-01-21 NOTE — Assessment & Plan Note (Signed)
In past with crestor Now mildly with zetia (? A factor)  Disc avoid otc meds ? If fatty liver- likely Stressed imp of wt loss Re check 3 mo

## 2011-01-21 NOTE — Assessment & Plan Note (Signed)
Will do tsh at next lab in 27mo  ? If starting to get a virus Also hectic schedule

## 2011-01-21 NOTE — Assessment & Plan Note (Signed)
This is fairly stable except for some high readings for past 2 d ? Early viral syndrome- is fatigued Stressed imp of diet No change in med Disc strategies for exercise with plantar fasciitis  Will plan f/u after next labs

## 2011-01-21 NOTE — Progress Notes (Signed)
Subjective:    Patient ID: Stacy Monroe, female    DOB: [birth year], 45 y.o.   MRN: [id number]  HPI Here for f/u of HTN and lipids and DM  Feeling tired in general - but nothing new    Wt is stable with high bmi  DM on amaryl and metformin fairly stable- a1c is up from 6.2 to 6.3 Sugars - the last few days has been up in the ams , which is unusual -- 170s (usually 120s)  Is really tired- no other symptoms / allergies are terrible  No new stress  Diet -- no real change from last time "could always be better" -- no bedtime snack No sweets or excess sugars  Still needs to eat more vegetables  Portions are small  No exercise- still due to plantar fasciitis  opthy- normal last august  On ace  HTN better with 116/72 today At home has been fine  No change in medicines  Lipids are imp with zetia (in past inc lft with crestor) LDL is down to 148 from 182-- not at goal but better Diet  Lab Results  Component Value Date   CHOL 201* 01/17/2011   CHOL 243* 09/27/2010   CHOL 217* 04/24/2008   Lab Results  Component Value Date   HDL 39.00* 01/17/2011   HDL 39.40 09/27/2010   HDL 32.1* 04/24/2008   No results found for this basename: LDLCALC   Lab Results  Component Value Date   TRIG 129.0 01/17/2011   TRIG 206.0* 09/27/2010   TRIG 210* 04/24/2008   Lab Results  Component Value Date   CHOLHDL 5 01/17/2011   CHOLHDL 6 09/27/2010   CHOLHDL 6.8 CALC 04/24/2008     ast /alt did bump slightly- will continue to watch  Does not eat a lot of fatty foods  Does eat out a lot  Does not take tylenol Is overweight  Does not drink alcohol No hx of hepatitis or any exposures   Past Medical History  Diagnosis Date  . Hypertension   . Hyperlipidemia   . Diabetes mellitus     type II controlled  . Palpitations   . Obesity   . Migraine     common  . Vertigo   . Plantar fasciitis   . S/P tonsillectomy    Past Surgical History  Procedure Date  . Bladder surgery 1990    bladder  tack after 2nd delivery    reports that she has never smoked. She does not have any smokeless tobacco history on file. She reports that she does not drink alcohol. Her drug history not on file. family history includes Alzheimer's disease in her maternal grandmother; Arthritis in her mother; Cancer in her sister; Depression in her mother and sisters; Drug abuse in her brother and mother; Hypertension in her mother; and Stroke in her maternal grandmother. No Known Allergies    Review of Systems  Review of Systems  Constitutional: Negative for fever, appetite change,  and unexpected weight change.  Eyes: Negative for pain and visual disturbance.  Respiratory: Negative for cough and shortness of breath.   Cardiovascular: Negative.   Gastrointestinal: Negative for nausea, diarrhea and constipation.  Genitourinary: Negative for urgency and frequency.  Skin: Negative for pallor.  Neurological: Negative for weakness, light-headedness, numbness and headaches.  Hematological: Negative for adenopathy. Does not bruise/bleed easily.  Psychiatric/Behavioral: Negative for dysphoric mood. The patient is not nervous/anxious.          Objective:   Physical Exam  Constitutional: She appears well-developed and well-nourished.       overwt and well appearing   HENT:  Head: Atraumatic.  Eyes: Conjunctivae and EOM are normal. Pupils are equal, round, and reactive to light.  Neck: Normal range of motion. Neck supple. No JVD present. Carotid bruit is not present. No thyromegaly present.  Cardiovascular: Normal rate, regular rhythm and normal heart sounds.   Pulmonary/Chest: Effort normal and breath sounds normal. She has no rales.  Abdominal: Soft. She exhibits no abdominal bruit and no mass. There is no tenderness.  Musculoskeletal: Normal range of motion. She exhibits no edema and no tenderness.  Lymphadenopathy:    She has no cervical adenopathy.  Neurological: She is alert. She has normal reflexes.  Coordination normal.  Skin: Skin is warm and dry. No rash noted. No erythema. No pallor.  Psychiatric: She has a normal mood and affect.          Assessment & Plan:

## 2011-01-21 NOTE — Patient Instructions (Signed)
Continue current medicines  Watch diet sugar/ starches/ saturated fats  If sugars remain high -- let me know  We will schedule non fasting labs in 3 months - liver and thyroid

## 2011-01-21 NOTE — Assessment & Plan Note (Signed)
This is significantly imp with zetia alone Not quite at goal-disc this Disc low sat fat diet- not very motivated  lfts are up a bit - re check those in 3 months

## 2011-02-11 NOTE — Assessment & Plan Note (Signed)
NAMELASHANTI, Stacy Monroe               ACCOUNT NO.:  [id number]   MEDICAL RECORD NO.:  [id number]          PATIENT TYPE:  POB   LOCATION:  [facility] at [facility]         FACILITY:  [facility]   PHYSICIAN:  Tinnie Gens, MD        DATE OF BIRTH:  04-27-1966   DATE OF SERVICE:                                  CLINIC NOTE   [name] returns today for followup.  She was seen here at her last  visit for abnormal uterine bleeding.  At that time she underwent some  hormone testing, endometrial sampling and pelvic sonography.  Her  hormone testing reveals a normal TSH, undetectable FSH and LH, and it is  unclear as to the exact etiology of that. She did take a home EPT that  was negative.  She did not have one here previously, but her husband had  a vasectomy in the past.  The endometrial sampling revealed benign  disordered proliferative endometrium.  Endometrium was disordered  proliferative pattern.  Variation in shape and density of the  endometrial glands, but no significant crabbing or cytologic atypia was  noted.  Ultrasound reveals a thickened endometrial stripe but she was  just prior to menses and she had sampling which was normal.  She had am  anterior uterine fibroid measuring  3 x 1.6 x 2.5, probably unlikely the  cause of her bleeding.  Normal right and left adnexa.   EXAM:  VITALS:  Are as noted in the chart.  She is a well-developed,  well-nourished female in no acute distress.  ABDOMEN:  Soft, nontender, nondistended.   IMPRESSION:  1. Abnormal uterine bleeding, unclear etiology.  Undetectable LH and      FSH, unclear etiology.  2. New diagnosis of diabetes.   PLAN:  I did do some research on how one could have abnormal uterine  bleeding with heavy exercise or bleeding related to ovulation that is  also related to exercise.  Information about this was given to the  patient, but in discussion with this patient, the patient was of the  opinion that her new diagnosis of diabetes and  other things are just  strange enough.  She also has undetectable FSH and LH levels which do  not make a lot of sense to me.  Because of all of these reasons, we will  refer her on to Endocrinology.  She would like to Dr. Reather Littler in  [place] and this will be set up for her.   Thank you again for referring this patient.  We will keep you abreast of  any further reports made known to [place] and she can follow up with [place] as  needed.           ______________________________  Tinnie Gens, MD     TP/MEDQ  D:  10/10/2008  T:  10/10/2008  Job:  [id number]   cc:   Legrand Rams, M.D,  [facility] @ [facility]

## 2011-04-15 ENCOUNTER — Other Ambulatory Visit: Payer: Self-pay | Admitting: Family Medicine

## 2011-04-15 NOTE — Telephone Encounter (Signed)
I do need to know what symptoms she is having  Let me know when you are able to get thru to her  Could ask the pharmacy to have her call us if her phone # has changed

## 2011-04-15 NOTE — Telephone Encounter (Addendum)
[facility] electronically request refill Terazol 3 (0.8% vaginal cream).Unable to reach pt by phone to see if pt is having problem. Dr Milinda Antis last saw pt and prescribed on 10/16/10.Please advise.

## 2011-04-15 NOTE — Telephone Encounter (Signed)
Left message pt's home # for pt to call back. Pt is not working this summer so cannot reach thru work.

## 2011-04-16 NOTE — Telephone Encounter (Signed)
Patient notified as instructed by telephone. 

## 2011-04-16 NOTE — Telephone Encounter (Signed)
Will refil electronically F/u if not imp

## 2011-04-16 NOTE — Telephone Encounter (Signed)
Patient notified as instructed by telephone. Pt thinks she has another yeast infection. Lower abdominal discomfort over the pubic area, slight vaginal discharge, perineal burning and itching. Pt would like to try rx and if that does not take care of will make appt to come in. Pt will ck with [facility] later today to see if rx there if not pt will expect callback on home # [phone].Please advise.

## 2011-04-21 ENCOUNTER — Other Ambulatory Visit (INDEPENDENT_AMBULATORY_CARE_PROVIDER_SITE_OTHER): Payer: BC Managed Care – PPO | Admitting: Family Medicine

## 2011-04-21 DIAGNOSIS — R5383 Other fatigue: Secondary | ICD-10-CM

## 2011-04-21 DIAGNOSIS — I10 Essential (primary) hypertension: Secondary | ICD-10-CM

## 2011-04-21 DIAGNOSIS — R5381 Other malaise: Secondary | ICD-10-CM

## 2011-04-21 DIAGNOSIS — R7401 Elevation of levels of liver transaminase levels: Secondary | ICD-10-CM

## 2011-04-21 DIAGNOSIS — E785 Hyperlipidemia, unspecified: Secondary | ICD-10-CM

## 2011-04-21 LAB — HEPATIC FUNCTION PANEL
ALT: 74 U/L — ABNORMAL HIGH (ref 0–35)
AST: 52 U/L — ABNORMAL HIGH (ref 0–37)
Alkaline Phosphatase: 36 U/L — ABNORMAL LOW (ref 39–117)
Bilirubin, Direct: 0.1 mg/dL (ref 0.0–0.3)
Total Bilirubin: 0.4 mg/dL (ref 0.3–1.2)
Total Protein: 7.1 g/dL (ref 6.0–8.3)

## 2011-04-22 ENCOUNTER — Telehealth: Payer: Self-pay

## 2011-04-22 NOTE — Telephone Encounter (Signed)
Message copied by Patience Musca on Tue Apr 22, 2011  5:58 PM ------      Message from: Roxy Manns A      Created: Mon Apr 21, 2011  9:36 PM       Liver tests are about the same      Nl thyroid       F/u in 3 months       Avoid fat in diet

## 2011-04-22 NOTE — Telephone Encounter (Signed)
Left vm for pt to callback 

## 2011-04-23 NOTE — Telephone Encounter (Signed)
Spoke with pt and gave results, follow up appt made.

## 2011-06-24 ENCOUNTER — Ambulatory Visit (INDEPENDENT_AMBULATORY_CARE_PROVIDER_SITE_OTHER): Payer: BC Managed Care – PPO | Admitting: Family Medicine

## 2011-06-24 ENCOUNTER — Encounter: Payer: Self-pay | Admitting: Family Medicine

## 2011-06-24 VITALS — BP 140/120 | HR 71 | Temp 98.0°F | Wt 210.8 lb

## 2011-06-24 DIAGNOSIS — I10 Essential (primary) hypertension: Secondary | ICD-10-CM

## 2011-06-24 DIAGNOSIS — F419 Anxiety disorder, unspecified: Secondary | ICD-10-CM

## 2011-06-24 DIAGNOSIS — F411 Generalized anxiety disorder: Secondary | ICD-10-CM

## 2011-06-24 DIAGNOSIS — F4323 Adjustment disorder with mixed anxiety and depressed mood: Secondary | ICD-10-CM | POA: Insufficient documentation

## 2011-06-24 DIAGNOSIS — F22 Delusional disorders: Secondary | ICD-10-CM

## 2011-06-24 LAB — CBC WITH DIFFERENTIAL/PLATELET
Basophils Absolute: 0.1 10*3/uL (ref 0.0–0.1)
Eosinophils Absolute: 0.2 10*3/uL (ref 0.0–0.7)
HCT: 41.8 % (ref 36.0–46.0)
Hemoglobin: 13.9 g/dL (ref 12.0–15.0)
Lymphs Abs: 2.1 10*3/uL (ref 0.7–4.0)
MCHC: 33.3 g/dL (ref 30.0–36.0)
MCV: 90.8 fl (ref 78.0–100.0)
Monocytes Absolute: 0.6 10*3/uL (ref 0.1–1.0)
Monocytes Relative: 7.2 % (ref 3.0–12.0)
Neutro Abs: 5.2 10*3/uL (ref 1.4–7.7)
Platelets: 267 10*3/uL (ref 150.0–400.0)
RDW: 12.8 % (ref 11.5–14.6)

## 2011-06-24 LAB — BASIC METABOLIC PANEL
CO2: 28 mEq/L (ref 19–32)
Calcium: 9.1 mg/dL (ref 8.4–10.5)
Creatinine, Ser: 0.7 mg/dL (ref 0.4–1.2)
GFR: 96.74 mL/min (ref >60.00)
Sodium: 139 mEq/L (ref 135–145)

## 2011-06-24 MED ORDER — ALPRAZOLAM 0.25 MG PO TABS: 0.2500 mg | ORAL_TABLET | Freq: Three times a day (TID) | ORAL | Status: DC | PRN

## 2011-06-24 MED ORDER — CITALOPRAM HYDROBROMIDE 10 MG PO TABS: 10.0000 mg | ORAL_TABLET | Freq: Every day | ORAL | Status: DC

## 2011-06-24 MED ORDER — CITALOPRAM HYDROBROMIDE 20 MG PO TABS: 20.0000 mg | ORAL_TABLET | Freq: Every day | ORAL | Status: DC

## 2011-06-24 NOTE — Patient Instructions (Addendum)
Good to see you. Please follow up with Dr. Milinda Antis in 1-2 weeks.

## 2011-06-24 NOTE — Progress Notes (Signed)
Subjective:    Patient ID: Stacy Monroe, female    DOB: [birth year], 45 y.o.   MRN: [id number]  HPI 45 yo new to me here for several concerns. HTN, depression, anxiety, depression feels paranoid.    Stopped taking her BP medication a few weeks ago, just started back a few days ago.    BP Readings from Last 3 Encounters:  06/24/11 140/120  01/21/11 112/68  11/26/10 124/80   Sugars have been stable.  Has a h/o post partum depression, was on Prozac years ago.  Past 3 weeks, having panic attacks, feels anxious all the time. Feels "paranoid" but cannot truly explain it other than she is afraid of spiders.  No SI or HI. No recent stressors in life. Significant family h/o depression.     Past Medical History  Diagnosis Date  . Hypertension   . Hyperlipidemia   . Diabetes mellitus     type II controlled  . Palpitations   . Obesity   . Migraine     common  . Vertigo   . Plantar fasciitis   . S/P tonsillectomy    Past Surgical History  Procedure Date  . Bladder surgery 1990    bladder tack after 2nd delivery    reports that she has never smoked. She does not have any smokeless tobacco history on file. She reports that she does not drink alcohol. Her drug history not on file. family history includes Alzheimer's disease in her maternal grandmother; Arthritis in her mother; Cancer in her sister; Depression in her mother and sisters; Drug abuse in her brother and mother; Hypertension in her mother; and Stroke in her maternal grandmother. No Known Allergies    Review of Systems  See HPI       Objective:   Physical Exam  Constitutional: She appears well-developed and well-nourished.       overwt and well appearing   HENT:  Head: Atraumatic.  Eyes: Conjunctivae and EOM are normal. Pupils are equal, round, and reactive to light.  Neck: Normal range of motion. Neck supple. No JVD present. Carotid bruit is not present. No thyromegaly present.  Cardiovascular: Normal rate,  regular rhythm and normal heart sounds.   Pulmonary/Chest: Effort normal and breath sounds normal. She has no rales.  Abdominal: Soft. She exhibits no abdominal bruit and no mass. There is no tenderness.  Musculoskeletal: Normal range of motion. She exhibits no edema and no tenderness.  Lymphadenopathy:    She has no cervical adenopathy.  Neurological: She is alert. She has normal reflexes. Coordination normal.  Skin: Skin is warm and dry. No rash noted. No erythema. No pallor.  Psychiatric: strange affect- flat and seems to take a few moments to process questions, anxious appearing.    Assessment and Plan: 1. Anxiety   New.  Will check labs to rule out other contributing factors but this seems like the underlying issue. Will start Celexa 10 mg daily, with as needed xanax.  Suggested psychotherapy.  She can receive free counseling at work and would prefer to try that.  TSH, CBC w/Diff, Basic Metabolic Panel (BMET), citalopram (CELEXA) 10 MG tablet, ALPRAZolam (XANAX) 0.25 MG tablet, DISCONTINUED: citalopram (CELEXA) 20 MG tablet, DISCONTINUED: ALPRAZolam (XANAX) 0.25 MG tablet  2. HYPERTENSION   Deteriorated.  Likely worsened by anxiety. Advised to check BP at home, follow up with Dr. Milinda Antis in 1-2 weeks.  No changes in HTN meds made today.   3. Paranoia   See above.   citalopram (CELEXA) 10  MG tablet, DISCONTINUED: citalopram (CELEXA) 20 MG tablet            Assessment & Plan:

## 2011-07-08 ENCOUNTER — Encounter: Payer: Self-pay | Admitting: *Deleted

## 2011-07-08 ENCOUNTER — Encounter: Payer: Self-pay | Admitting: Family Medicine

## 2011-07-08 ENCOUNTER — Ambulatory Visit (INDEPENDENT_AMBULATORY_CARE_PROVIDER_SITE_OTHER): Payer: BC Managed Care – PPO | Admitting: Family Medicine

## 2011-07-08 DIAGNOSIS — J069 Acute upper respiratory infection, unspecified: Secondary | ICD-10-CM

## 2011-07-08 DIAGNOSIS — F411 Generalized anxiety disorder: Secondary | ICD-10-CM

## 2011-07-08 DIAGNOSIS — F419 Anxiety disorder, unspecified: Secondary | ICD-10-CM

## 2011-07-08 MED ORDER — BENZONATATE 100 MG PO CAPS: 100.0000 mg | ORAL_CAPSULE | Freq: Three times a day (TID) | ORAL | Status: AC | PRN

## 2011-07-08 MED ORDER — GLUCOSE BLOOD VI STRP: ORAL_STRIP | Status: DC

## 2011-07-08 MED ORDER — GLIMEPIRIDE 1 MG PO TABS: 1.0000 mg | ORAL_TABLET | Freq: Every day | ORAL | Status: DC

## 2011-07-08 MED ORDER — IPRATROPIUM BROMIDE 0.03 % NA SOLN: 2.0000 | Freq: Three times a day (TID) | NASAL | Status: DC

## 2011-07-08 NOTE — Patient Instructions (Signed)
Upper Respiratory Infection -Viral Infections  TREATMENT 1. Drink plenty of fluids, but limit caffeine 2. Decongestant: AVOID SUDAFED, PHENYLEPHRINE, OR PSEUDOEPHEDRINE PRODUCTS 3. Nasal Sprays: Relieve pressure, promote drainage, open nasal and ear passages. AFRIN 2 SPRAYS EACH NOSTRIL TWICE A DAY 5. Cough MEDS: CAN TAKE TESSALON PERLES UP TO THREE TIMES A DAY  AFRIN - DO THE 2 SPRAYS AND ABOUT 30 MINUTES LATER, DO THE IPATROPIUM NASAL SPRAY

## 2011-07-08 NOTE — Progress Notes (Signed)
  Subjective:    Patient ID: Stacy Monroe, female    DOB: [birth year], 45 y.o.   MRN: [id number]  HPI  Stacy Monroe, a 45 y.o. female presents today in the office for the following:    Friday night, started to take some mucinex. Sore throat. Cough and congestion. Took some delsym  Also. Monday went to go and take some mucinex.  Also has been having some panic attacks On some celexa and taking some xanax.  Taking some plain mucinex and mucinex d.   amaryl also for DM -- 10 mg  Patent presents with runny nose, sneezing, cough, sore throat, malaise and minimal / low-grade fever .   ? recent exposure to others with similar symptoms.   The patent denies sore throat as the primary complaint. Denies sthortness of breath/wheezing, high fever, chest pain, rhinits for more than 14 days, significant myalgia, otalgia, facial pain, abdominal pain, changes in bowel or bladder.  PMH, PHS, Allergies, Problem List, Medications, Family History, and Social History have all been reviewed.  ROS: as above, eating and drinking - tolerating PO. Urinating normally. No excessive vomitting or diarrhea. O/w as above.  PHYSICAL EXAM  Blood pressure 120/80, pulse 66, temperature 97.6 F (36.4 C), temperature source Oral, height 5\' 4"  (1.626 m), weight 207 lb 4 oz (94.008 kg), last menstrual period 06/08/2011.  PE: GEN: WDWN, Non-toxic, Atraumatic, normocephalic. A and O x 3. HEENT: Oropharynx clear without exudate, MMM, no significant LAD, mild rhinnorhea Ears: TM clear, COL visualized with good landmarks CV: RRR, no m/g/r. Pulm: CTA B, no wheezes, rhonchi, or crackles, normal respiratory effort. EXT: no c/c/e Psych: well oriented, neither depressed nor anxious in appearance  A/P: 1. URI. Supportive care reviewed with patient. See patient instruction section. Tessalon, afrin, and atrovent nasal 2. Suspect decongestants making panic attacks worse, d/c  Review of Systems     Objective:   Physical  Exam        Assessment & Plan:

## 2011-07-09 ENCOUNTER — Ambulatory Visit: Payer: BC Managed Care – PPO | Admitting: Family Medicine

## 2011-07-25 ENCOUNTER — Encounter: Payer: Self-pay | Admitting: Family Medicine

## 2011-07-25 ENCOUNTER — Ambulatory Visit (INDEPENDENT_AMBULATORY_CARE_PROVIDER_SITE_OTHER): Payer: BC Managed Care – PPO | Admitting: Family Medicine

## 2011-07-25 VITALS — BP 124/80 | HR 72 | Temp 98.0°F | Ht 64.0 in | Wt 207.5 lb

## 2011-07-25 DIAGNOSIS — I10 Essential (primary) hypertension: Secondary | ICD-10-CM

## 2011-07-25 DIAGNOSIS — E785 Hyperlipidemia, unspecified: Secondary | ICD-10-CM

## 2011-07-25 DIAGNOSIS — B373 Candidiasis of vulva and vagina: Secondary | ICD-10-CM

## 2011-07-25 DIAGNOSIS — E119 Type 2 diabetes mellitus without complications: Secondary | ICD-10-CM

## 2011-07-25 DIAGNOSIS — F411 Generalized anxiety disorder: Secondary | ICD-10-CM

## 2011-07-25 DIAGNOSIS — F419 Anxiety disorder, unspecified: Secondary | ICD-10-CM

## 2011-07-25 DIAGNOSIS — B3731 Acute candidiasis of vulva and vagina: Secondary | ICD-10-CM

## 2011-07-25 MED ORDER — LANSOPRAZOLE 15 MG PO CPDR: 15.0000 mg | DELAYED_RELEASE_CAPSULE | Freq: Every day | ORAL | Status: DC

## 2011-07-25 MED ORDER — FLUCONAZOLE 150 MG PO TABS: 150.0000 mg | ORAL_TABLET | Freq: Once | ORAL | Status: AC

## 2011-07-25 NOTE — Patient Instructions (Addendum)
Keep watching diet and get as much exercise as you can I'm glad you are feeling better Schedule fasting lab and then f/u in January Take diflucan for yeast and let me know if it does not improve

## 2011-07-25 NOTE — Progress Notes (Signed)
Subjective:    Patient ID: Stacy Monroe, female    DOB: 1965-12-13, 45 y.o.   MRN: [id number]  HPI Here for f/u of HTN and anxiety and uri (recent)  [place]- is overall better- still coughing / a little stuffy - is much better    Wt is not changed with bmi of 35  DM- sugar in 130s last labs  Was high off meds in summer Now much better than she was  Doing well with eating -- has lost some weight  Trying to walk more at work and make more active choices  Just got back on meds on early oct   Working and in school   Saw Dr Dayton Martes and was put on celexa an xanax for anxiety symptoms with some paranoia Feels much better than she was - and now keeping a log of her symptoms and moods Thinks her symptoms are related to her cycle-- much worse anx and irritability - when closer to her menses Is seeing a therapist -- through work -- has 2nd visit on Monday  Has more stress than she thought previously   She had to go off all of her meds in July/aug due to finances - and bp was very high/ and sugar  Much better now  Feels a whole lot better  Now actos plus met is a generic - happy with that  No  Edema or sob    bp was high at that time- much better now with 124/80 No cp or edema or ha No problems with med   Thinks she has yeast infx from recent abx Would like to take diflucan  Patient Active Problem List  Diagnoses  . DIABETES MELLITUS, TYPE II, CONTROLLED  . UNSPECIFIED VITAMIN D DEFICIENCY  . HYPERLIPIDEMIA  . OBESITY  . COMMON MIGRAINE  . HYPERTENSION  . WEIGHT LOSS-ABNORMAL  . Nonspecific (abnormal) findings on radiological and other examination of body structure  . PLANTAR FASCIITIS  . Fatigue  . Elevated transaminase level  . Anxiety  . Paranoia  . Yeast vaginitis   Past Medical History  Diagnosis Date  . Hypertension   . Hyperlipidemia   . Diabetes mellitus     type II controlled  . Palpitations   . Obesity   . Migraine     common  . Vertigo   . Plantar  fasciitis   . S/P tonsillectomy    Past Surgical History  Procedure Date  . Bladder surgery 1990    bladder tack after 2nd delivery   History  Substance Use Topics  . Smoking status: Never Smoker   . Smokeless tobacco: Not on file  . Alcohol Use: No   Family History  Problem Relation Age of Onset  . Arthritis Mother     RA  . Hypertension Mother   . Drug abuse Mother   . Depression Mother   . Cancer Sister     thyroid cancer, reoccured 2008  . Depression Sister   . Drug abuse Brother   . Stroke Maternal Grandmother   . Alzheimer's disease Maternal Grandmother   . Depression Sister   . Depression Sister    No Known Allergies Current Outpatient Prescriptions on File Prior to Visit  Medication Sig Dispense Refill  . ALPRAZolam (XANAX) 0.25 MG tablet Take 1 tablet (0.25 mg total) by mouth 3 (three) times daily as needed for anxiety.  30 tablet  0  . aspirin 81 MG tablet Take 81 mg by mouth daily.        Marland Kitchen  cetirizine (ZYRTEC HIVES RELIEF) 10 MG tablet Take 10 mg by mouth daily.        . citalopram (CELEXA) 10 MG tablet Take 1 tablet (10 mg total) by mouth daily.  30 tablet  2  . glimepiride (AMARYL) 1 MG tablet Take 1 tablet (1 mg total) by mouth daily before breakfast.  1 tablet  1  . glucose blood test strip test once daily or as needed. DM 250.00  100 each  12  . metFORMIN (GLUCOPHAGE) 500 MG tablet Take 500 mg by mouth 2 (two) times daily with a meal.        . naproxen sodium (ALEVE) 220 MG tablet 2 tablets by mouth once daily as needed.       . eletriptan (RELPAX) 40 MG tablet One tablet by mouth as needed for migraine headache.  If the headache improves and then returns, dose may be repeated after 2 hours have elapsed since first dose (do not exceed 80 mg per day). 1 tablet by mouth at onset of headache, repeat in 2 hours if not resolved.       . hyoscyamine (LEVSIN SL) 0.125 MG SL tablet Place 0.125 mg under the tongue every 6 (six) hours as needed. for abdominal cramps.        Marland Kitchen ipratropium (ATROVENT) 0.03 % nasal spray Place 2 sprays into the nose 3 (three) times daily.  30 mL  2  . terconazole (TERAZOL 3) 0.8 % vaginal cream INSERT ONE APPLICATORFUL VAGINALLY AT BEDTIME FOR 3 DAYS. YOU CAN ALSO USE SOME EXTERNALLY FOR ITCH  20 g  0     Review of Systems Review of Systems  Constitutional: Negative for fever, appetite change, fatigue and unexpected weight change.  Eyes: Negative for pain and visual disturbance.  Respiratory: Negative for cough and shortness of breath.   Cardiovascular: Negative for cp or palpitations    Gastrointestinal: Negative for nausea, diarrhea and constipation.  Genitourinary: Negative for urgency and frequency. no excess thirst or urination Skin: Negative for pallor or rash   Neurological: Negative for weakness, light-headedness, numbness and headaches.  Hematological: Negative for adenopathy. Does not bruise/bleed easily.  Psychiatric/Behavioral:pos for some anxiety and depression symptoms that are improved          Objective:   Physical Exam  Constitutional: She appears well-developed and well-nourished. No distress.       overwt and well appearing   HENT:  Head: Normocephalic and atraumatic.  Mouth/Throat: Oropharynx is clear and moist.  Eyes: Conjunctivae and EOM are normal. Pupils are equal, round, and reactive to light. No scleral icterus.  Neck: Normal range of motion. Neck supple. No JVD present. Carotid bruit is not present. No thyromegaly present.  Cardiovascular: Normal rate, regular rhythm, normal heart sounds and intact distal pulses.   Pulmonary/Chest: Effort normal and breath sounds normal. No respiratory distress. She has no wheezes.  Abdominal: Soft. Bowel sounds are normal. She exhibits no distension and no mass. There is no tenderness.  Musculoskeletal: Normal range of motion. She exhibits no edema and no tenderness.  Lymphadenopathy:    She has no cervical adenopathy.  Neurological: She is alert. She has  normal reflexes. No cranial nerve deficit. She exhibits normal muscle tone. Coordination normal.  Skin: Skin is warm and dry. No rash noted. No erythema. No pallor.  Psychiatric: She has a normal mood and affect.          Assessment & Plan:

## 2011-07-27 NOTE — Assessment & Plan Note (Signed)
Much improved with celexa and will stay on this Enc to continue counseling  Disc stressors and coping tech  Disc PMDD

## 2011-07-27 NOTE — Assessment & Plan Note (Signed)
Had gone up off zetia (cost)- is now back on track Rev last labs and goals for cholesterol  Rev low sat fat diet in detail

## 2011-07-27 NOTE — Assessment & Plan Note (Signed)
Was off med due to cost for a while but now back on track with that and her diet  Rev diet and exercise Will check a1c in 3 mo and f/u

## 2011-07-27 NOTE — Assessment & Plan Note (Signed)
After recent abx Given px for 1 dose diflucan Will f/u if not imp

## 2011-07-27 NOTE — Assessment & Plan Note (Signed)
bp is very good on ace No changes Enc exercise

## 2011-08-08 ENCOUNTER — Telehealth: Payer: Self-pay

## 2011-08-08 NOTE — Telephone Encounter (Signed)
Pt called and left v/m that the Prevacid sent in the end of Oct was correct except it needed to be the OTC Prevacid. Dr Milinda Antis said it was OK to change to OTC Prevacid.  I could not find the OTC Prevacid in our system and I called [facility] spoke with [facility] and she said the OTC prevacid is in there system and to call back and leave on their v/m. I did that and left v/m message on pts cell to ck with [facility] for OTC Prevacid.

## 2011-08-08 NOTE — Telephone Encounter (Signed)
That is fine - let me know if I need to do anything, thanks

## 2011-09-25 ENCOUNTER — Other Ambulatory Visit: Payer: Self-pay | Admitting: Family Medicine

## 2011-09-26 NOTE — Telephone Encounter (Signed)
She has been seen within the past year - so it can be refilled  Will refill electronically

## 2011-10-17 ENCOUNTER — Other Ambulatory Visit (INDEPENDENT_AMBULATORY_CARE_PROVIDER_SITE_OTHER): Payer: BC Managed Care – PPO

## 2011-10-17 DIAGNOSIS — E119 Type 2 diabetes mellitus without complications: Secondary | ICD-10-CM

## 2011-10-17 DIAGNOSIS — I10 Essential (primary) hypertension: Secondary | ICD-10-CM

## 2011-10-17 DIAGNOSIS — E785 Hyperlipidemia, unspecified: Secondary | ICD-10-CM

## 2011-10-17 LAB — COMPREHENSIVE METABOLIC PANEL
ALT: 52 U/L — ABNORMAL HIGH (ref 0–35)
AST: 36 U/L (ref 0–37)
Albumin: 4.2 g/dL (ref 3.5–5.2)
Alkaline Phosphatase: 38 U/L — ABNORMAL LOW (ref 39–117)
Glucose, Bld: 113 mg/dL — ABNORMAL HIGH (ref 70–99)
Potassium: 4 mEq/L (ref 3.5–5.1)
Sodium: 139 mEq/L (ref 135–145)
Total Protein: 6.9 g/dL (ref 6.0–8.3)

## 2011-10-17 LAB — LDL CHOLESTEROL, DIRECT: Direct LDL: 136.4 mg/dL

## 2011-10-17 LAB — HEMOGLOBIN A1C: Hgb A1c MFr Bld: 6 % (ref 4.6–6.5)

## 2011-10-17 LAB — LIPID PANEL
Total CHOL/HDL Ratio: 5
VLDL: 30.2 mg/dL (ref 0.0–40.0)

## 2011-10-24 ENCOUNTER — Ambulatory Visit (INDEPENDENT_AMBULATORY_CARE_PROVIDER_SITE_OTHER): Payer: BC Managed Care – PPO | Admitting: Family Medicine

## 2011-10-24 ENCOUNTER — Encounter: Payer: Self-pay | Admitting: Family Medicine

## 2011-10-24 VITALS — BP 122/76 | HR 88 | Temp 97.5°F | Ht 64.0 in | Wt 211.0 lb

## 2011-10-24 DIAGNOSIS — J069 Acute upper respiratory infection, unspecified: Secondary | ICD-10-CM

## 2011-10-24 DIAGNOSIS — E119 Type 2 diabetes mellitus without complications: Secondary | ICD-10-CM

## 2011-10-24 DIAGNOSIS — F419 Anxiety disorder, unspecified: Secondary | ICD-10-CM

## 2011-10-24 DIAGNOSIS — E785 Hyperlipidemia, unspecified: Secondary | ICD-10-CM

## 2011-10-24 DIAGNOSIS — F411 Generalized anxiety disorder: Secondary | ICD-10-CM

## 2011-10-24 DIAGNOSIS — I10 Essential (primary) hypertension: Secondary | ICD-10-CM

## 2011-10-24 MED ORDER — CITALOPRAM HYDROBROMIDE 10 MG PO TABS: 10.0000 mg | ORAL_TABLET | Freq: Every day | ORAL | Status: DC

## 2011-10-24 NOTE — Progress Notes (Signed)
Subjective:    Patient ID: Stacy Monroe, female    DOB: [birth year], 46 y.o.   MRN: [id number]  HPI Here for f/u of anxiety/ diabetes/ lipids HTN and also new Rene Paci - new Sore throat and nasal drainge and congestion  No facial pain  Is coughing  Prod cough - phlegm - is clear for the most part Started on Tuesday  No otc meds No fever     On celexa for anxiety- is doing ok with that  That ran out back in December -- tried to go off of it and felt much more anxious and flustered  Needs refil on that   Got back on DM med  a1c 6.0 - great  -- diet is much better also  Oatmeal- different breakfast Is in school full time -- not a lot of time to exercise  Tries to move more in general / standing/ taking stairs  Lab Results  Component Value Date   HGBA1C 6.0 10/17/2011     Lipids are mildly improved with zetia Lab Results  Component Value Date   CHOL 201* 10/17/2011   CHOL 201* 01/17/2011   CHOL 243* 09/27/2010   Lab Results  Component Value Date   HDL 42.70 10/17/2011   HDL 21.30* 01/17/2011   HDL 39.40 09/27/2010   No results found for this basename: LDLCALC   Lab Results  Component Value Date   TRIG 151.0* 10/17/2011   TRIG 129.0 01/17/2011   TRIG 206.0* 09/27/2010   Lab Results  Component Value Date   CHOLHDL 5 10/17/2011   CHOLHDL 5 01/17/2011   CHOLHDL 6 09/27/2010   Lab Results  Component Value Date   LDLDIRECT 136.4 10/17/2011   LDLDIRECT 148.4 01/17/2011   LDLDIRECT 182.9 09/27/2010   gradually coming down - also watching fats in the diet    Wt is up 4 lb with bmi of 36 Aware she needs to loose wt  Diet is improved but not at goal Likely needs to dec portions No regular exercise  bp is   122/76  Today No cp or palpitations or headaches or edema  No side effects to medicines    Patient Active Problem List  Diagnoses  . DIABETES MELLITUS, TYPE II, CONTROLLED  . UNSPECIFIED VITAMIN D DEFICIENCY  . HYPERLIPIDEMIA  . OBESITY  . COMMON MIGRAINE    . HYPERTENSION  . WEIGHT LOSS-ABNORMAL  . Nonspecific (abnormal) findings on radiological and other examination of body structure  . PLANTAR FASCIITIS  . Fatigue  . Elevated transaminase level  . Anxiety  . Paranoia  . Yeast vaginitis  . Viral URI with cough   Past Medical History  Diagnosis Date  . Hypertension   . Hyperlipidemia   . Diabetes mellitus     type II controlled  . Palpitations   . Obesity   . Migraine     common  . Vertigo   . Plantar fasciitis   . S/P tonsillectomy    Past Surgical History  Procedure Date  . Bladder surgery 1990    bladder tack after 2nd delivery   History  Substance Use Topics  . Smoking status: Never Smoker   . Smokeless tobacco: Not on file  . Alcohol Use: No   Family History  Problem Relation Age of Onset  . Arthritis Mother     RA  . Hypertension Mother   . Drug abuse Mother   . Depression Mother   . Cancer Sister  thyroid cancer, reoccured 2008  . Depression Sister   . Drug abuse Brother   . Stroke Maternal Grandmother   . Alzheimer's disease Maternal Grandmother   . Depression Sister   . Depression Sister    No Known Allergies Current Outpatient Prescriptions on File Prior to Visit  Medication Sig Dispense Refill  . ALPRAZolam (XANAX) 0.25 MG tablet Take 1 tablet (0.25 mg total) by mouth 3 (three) times daily as needed for anxiety.  30 tablet  0  . aspirin 81 MG tablet Take 81 mg by mouth daily.        . cetirizine (ZYRTEC HIVES RELIEF) 10 MG tablet Take 10 mg by mouth daily.        . Cholecalciferol (VITAMIN D3) 1000 UNITS CAPS Take 1 capsule by mouth daily.        Marland Kitchen eletriptan (RELPAX) 40 MG tablet One tablet by mouth as needed for migraine headache.  If the headache improves and then returns, dose may be repeated after 2 hours have elapsed since first dose (do not exceed 80 mg per day). 1 tablet by mouth at onset of headache, repeat in 2 hours if not resolved.       . ezetimibe (ZETIA) 10 MG tablet Take 10 mg  by mouth daily.        Marland Kitchen glimepiride (AMARYL) 1 MG tablet Take 1 tablet (1 mg total) by mouth daily before breakfast.  1 tablet  1  . glucose blood test strip test once daily or as needed. DM 250.00  100 each  12  . hyoscyamine (LEVSIN SL) 0.125 MG SL tablet Place 0.125 mg under the tongue every 6 (six) hours as needed. for abdominal cramps.       Marland Kitchen ipratropium (ATROVENT) 0.03 % nasal spray Place 2 sprays into the nose 3 (three) times daily.  30 mL  2  . lansoprazole (PREVACID 24HR) 15 MG capsule Take 1 capsule (15 mg total) by mouth daily.  90 capsule  3  . lisinopril (PRINIVIL,ZESTRIL) 20 MG tablet Take 20 mg by mouth daily.        . metFORMIN (GLUCOPHAGE) 500 MG tablet Take 500 mg by mouth 2 (two) times daily with a meal.        . naproxen sodium (ALEVE) 220 MG tablet 2 tablets by mouth once daily as needed.       Marland Kitchen terconazole (TERAZOL 3) 0.8 % vaginal cream INSERT ONE APPLICATORFUL VAGINALLY AT BEDTIME FOR 3 DAYS. YOU CAN ALSO USE SOME EXTERNALLY FOR ITCH  20 g  0     Review of Systems Review of Systems  Constitutional: Negative for fever, appetite change, and unexpected weight change. pos for fatigue  Eyes: Negative for pain and visual disturbance.  ENT pos for st and congestion/ neg for sinus or ear pain  Respiratory: Negative for cough and shortness of breath.   Cardiovascular: Negative for cp or palpitations    Gastrointestinal: Negative for nausea, diarrhea and constipation.  Genitourinary: Negative for urgency and frequency.  Skin: Negative for pallor or rash   Neurological: Negative for weakness, light-headedness, numbness and headaches.  Hematological: Negative for adenopathy. Does not bruise/bleed easily.  Psychiatric/Behavioral: depression and anx are controlled , no SI        Objective:   Physical Exam  Constitutional: She appears well-developed and well-nourished. No distress.       Obese and well appearing   HENT:  Head: Normocephalic and atraumatic.  Right Ear:  External ear normal.  Left Ear: External ear normal.  Mouth/Throat: Oropharynx is clear and moist. No oropharyngeal exudate.       Nares are injected and congested  No sinus tenderness Clear post nasal drainage noted   Eyes: Conjunctivae and EOM are normal. Pupils are equal, round, and reactive to light. Right eye exhibits no discharge. Left eye exhibits no discharge. No scleral icterus.  Neck: Normal range of motion. Neck supple. No JVD present. Carotid bruit is not present. No thyromegaly present.  Cardiovascular: Normal rate, regular rhythm, normal heart sounds and intact distal pulses.  Exam reveals no gallop.   Pulmonary/Chest: Effort normal and breath sounds normal. No respiratory distress. She has no wheezes.  Abdominal: Soft. Bowel sounds are normal. She exhibits no distension and no abdominal bruit. There is no tenderness.  Musculoskeletal: Normal range of motion. She exhibits no edema and no tenderness.  Lymphadenopathy:    She has no cervical adenopathy.  Neurological: She is alert. She has normal reflexes. She displays no tremor. No cranial nerve deficit. She exhibits normal muscle tone. Coordination normal.  Skin: Skin is warm and dry. No rash noted. No erythema. No pallor.  Psychiatric:       Pt does seem a bit anxious and paranoid today  For example - she took offense and snapped at me when I asked her if she had any facial pain with her cold,  asking why I should ask such a question Seemed generally suspicious of me No evidence of frank delusion however           Assessment & Plan:

## 2011-10-24 NOTE — Patient Instructions (Addendum)
I think you have a viral head and chest cold I recommend mucinex plain or mucinex DM for congestion (to loosen mucous) and cough  Do not use anything with "D"  Nasal saline spray (simply saline) is helpful Afrin can cause rebound symptoms- do not use for more than a day or 2  Breathing steam helps too If fever- aleve is ok -- taken with food Update if not starting to improve in a week or if worsening   Cholesterol is improved Avoid red meat/ fried foods/ egg yolks/ fatty breakfast meats/ butter, cheese and high fat dairy/ and shellfish   Sugar is in good control  Try to fit in exercise when you can  Low fat/ low sugar diet is best  Will refil of celexa  Follow up in 6 months

## 2011-10-26 NOTE — Assessment & Plan Note (Signed)
In good control back on her medicines Lab Results  Component Value Date   HGBA1C 6.0 10/17/2011    Disc imp of low glycemic diet and wt loss  Rev opthy/ foot care

## 2011-10-26 NOTE — Assessment & Plan Note (Signed)
bp in fair control at this time  No changes needed  Disc lifstyle change with low sodium diet and exercise   

## 2011-10-26 NOTE — Assessment & Plan Note (Signed)
Informed pt I think this is a head and chest cold that will take time to get better Disc symptomatic care - see instructions on AVS  Update if not starting to improve in a week or if worsening  - esp if fever or wheeze/worse cough

## 2011-10-26 NOTE — Assessment & Plan Note (Signed)
Fair control on zetia- which is all she can or will take Still not at goal  Disc goals for lipids and reasons to control them Rev labs with pt Rev low sat fat diet in detail

## 2011-10-26 NOTE — Assessment & Plan Note (Signed)
Per pt well controlled on celexa after stopping it for a while and noticing much worse anxiety See exam- I did think she was acting mildly paranoid today No delusion noted, however

## 2011-10-31 ENCOUNTER — Other Ambulatory Visit: Payer: Self-pay | Admitting: Family Medicine

## 2011-11-28 ENCOUNTER — Telehealth: Payer: Self-pay | Admitting: Family Medicine

## 2011-11-28 NOTE — Telephone Encounter (Signed)
Left v/m on pts cell Dr Milinda Antis did agree for pt to go to ER and asked pt to call back on Monday with update on how she was doing.

## 2011-11-28 NOTE — Telephone Encounter (Signed)
To: [facility] (Daytime Triage) Fax: [phone] From: [facility] Date/ Time: 11/28/2011 2:25 PM Taken By: Geanie Berlin, RN Caller: Lyla Son Facility: not collected Patient: Stacy Monroe, Stacy Monroe DOB: 01-28-1966 Phone: [phone] Reason for Call: Sending to ED in [place] d/t HTN (174/94) with blurred vision and muffled hearing. Regarding Appointment: Appt Date: Appt Time: Unknown Provider: Reason: Details: Outcome:

## 2011-11-28 NOTE — Telephone Encounter (Signed)
I agree with advisement to go to the ER  Please check in with her when able to see how things went and request records when they are ready

## 2011-11-28 NOTE — Telephone Encounter (Signed)
Triage Record Num: [id number] Operator: Geanie Berlin Patient Name: Brennley Curtice Call Date & Time: 11/28/2011 2:14:55PM Patient Phone: [phone] PCP: Audrie Gallus. Tower Patient Gender: Female PCP Fax : Patient DOB: 10/28/1965 Practice Name: [facility] Reason for Call: Caller: Meloney/Patient; PCP: Roxy Manns A.; CB#: [id number]; Call regarding Hypertension; Notes hypertension and not feeling well, mild confusion. BP checked by hospital staff while visiting son. Onset: 11/25/11. BP 174/94 L arm sitting at 1345. Does not have glucometer with her at [facility]. LMP 11/07/11; husb had vasectomy. Notes blurred vision and difficulty hearing. Slow thought process. Advised to go to ED now for new onset of visual disturbance per Hypertension Diagnosed Guideline. Protocol(s) Used: Hypertension, Diagnosed or Suspected Recommended Outcome per Protocol: See ED Immediately Reason for Outcome: New onset of visual disturbances such as double or blurred vision, spots before eyes or flashing lights Care Advice: ~ Protect the patient from falling or other harm. ~ Another adult should drive. ~ IMMEDIATE ACTION Write down provider's name. List or place the following in a bag for transport with the patient: current prescription and/or nonprescription medications; alternative treatments, therapies and medications; and street drugs. ~ 11/28/2011 2:27:48PM Page 1 of 1 [email or web address]

## 2011-12-02 NOTE — Telephone Encounter (Signed)
Left message on cell phone voicemail for patient to call us with an update of how she is doing.

## 2011-12-03 ENCOUNTER — Telehealth: Payer: Self-pay | Admitting: Family Medicine

## 2011-12-03 NOTE — Telephone Encounter (Signed)
Patient called back, says she is feeling better,told blood pressure on Saturday was 138/89. Says he son is really sick she feels her blood pressure is up because of her sons illness.

## 2011-12-04 ENCOUNTER — Encounter: Payer: Self-pay | Admitting: Family Medicine

## 2011-12-04 ENCOUNTER — Ambulatory Visit (INDEPENDENT_AMBULATORY_CARE_PROVIDER_SITE_OTHER): Payer: BC Managed Care – PPO | Admitting: Family Medicine

## 2011-12-04 VITALS — BP 132/80 | HR 80 | Temp 98.3°F | Wt 204.5 lb

## 2011-12-04 DIAGNOSIS — F411 Generalized anxiety disorder: Secondary | ICD-10-CM

## 2011-12-04 DIAGNOSIS — E785 Hyperlipidemia, unspecified: Secondary | ICD-10-CM

## 2011-12-04 DIAGNOSIS — E119 Type 2 diabetes mellitus without complications: Secondary | ICD-10-CM

## 2011-12-04 DIAGNOSIS — I10 Essential (primary) hypertension: Secondary | ICD-10-CM

## 2011-12-04 DIAGNOSIS — R0789 Other chest pain: Secondary | ICD-10-CM | POA: Insufficient documentation

## 2011-12-04 DIAGNOSIS — F419 Anxiety disorder, unspecified: Secondary | ICD-10-CM

## 2011-12-04 MED ORDER — ATORVASTATIN CALCIUM 20 MG PO TABS: 20.0000 mg | ORAL_TABLET | Freq: Every day | ORAL | Status: DC

## 2011-12-04 NOTE — Assessment & Plan Note (Addendum)
Risk factors include T2DM (controlled), HTN, HLD, fmhx premature CAD. Now with new substernal chest pressure not exhertion related or relieved by rest. Significant stress recently - anticipate component of anxiety.  Recommend xanax use. However does have significant risk factors.  Will refer to cards for further risk stratification. Continue baby aspirin, rec start lipitor for better lipid control. EKG - NSR 77, normal axis, intervals, no hypertrophy or acute ST/T changes.  + poor R wave progression - slight change compared to EKG from 2009.

## 2011-12-04 NOTE — Assessment & Plan Note (Signed)
LDL 130s 09/2011. Continue zetia, start lipitor generic at 20mg  daily for goal LDL <100. Pt states stopped crestor 2/2 too expensive.   Willing to try lipitor.

## 2011-12-04 NOTE — Telephone Encounter (Signed)
Patient in to see Dr. Reece Agar today.

## 2011-12-04 NOTE — Patient Instructions (Addendum)
This is likely all stemming from stress but I do think it's reasonable to see heart doctor. Start lipitor (atorvastatin 20mg  daily) for cholesterol. Continue zetia for now. Pass by [facility]'s office to schedule appointment with heart doctor. Use xanax as needed (try 1/2 pill first to decrease sedation). Sounds like you have a lot on your plate right now.   Good to see you today, call us with questions. If not improving with xanax, return to see [place].  If worsening chest discomfort, please get checked out sooner.

## 2011-12-04 NOTE — Assessment & Plan Note (Addendum)
Anticipate majority of sxs stemming from stressful situation with son. rec trial xanax when again feels overwhelmed (start at 1/2 tab to decrease sedation). Risk factors present - see below.

## 2011-12-04 NOTE — Progress Notes (Signed)
Subjective:    Patient ID: Stacy Monroe, female    DOB: March 07, 1966, 46 y.o.   MRN: [id number]  HPI CC: elevated BP  H/o controlled DM, HTN, HLD and anxiety.  Significant stress recently over son sick - son admitted to [facility] 8d last week, liver disease and to be placed on liver transplant list.  While there pt didn't feel well and had bp checked, Friday very high 174/92.  Called here and told by triage to go to ER.  However decided against this as son was being discharged home, instead rested over weekend, and bp dropped to 130/80s.  Did feel more relaxed over weekend.    Has been having tension in back of neck, ache in neck.  States she knows when her bp is too high because gets dizzy and HA and some blurry vision.    Had some chest heaviness last few days, described as substernal chest pressure, happens at night while in bed.  Getting up out of bed and walking makes this better.  Currently pain free.  Denies worsening dizziness, shortness of breath, nausea/diaphoresis.  LMP - 11/06/2011.  Wonders if cycle contributing to some of this.  Doing better on citalopram with anxiety and cycle related issues.  H/o anxiety - placed on xanax and celexa.  Did not have xanax while in hospital so did not try this.  No smokers at home.  fmhx - grandparents with heart disease.  Father deceased of MI at age 26 after administered penicillin (PCN allergy). Lab Results  Component Value Date   HGBA1C 6.0 10/17/2011   Lab Results  Component Value Date   CHOL 201* 10/17/2011   HDL 42.70 10/17/2011   LDLDIRECT 136.4 10/17/2011   TRIG 151.0* 10/17/2011   CHOLHDL 5 10/17/2011   Prior on crestor but too expensive.  Currently on zetia.  Medications and allergies reviewed and updated in chart.  Past histories reviewed and updated if relevant as below. Patient Active Problem List  Diagnoses  . DIABETES MELLITUS, TYPE II, CONTROLLED  . UNSPECIFIED VITAMIN D DEFICIENCY  . HYPERLIPIDEMIA  . OBESITY  . COMMON  MIGRAINE  . HYPERTENSION  . WEIGHT LOSS-ABNORMAL  . Nonspecific (abnormal) findings on radiological and other examination of body structure  . PLANTAR FASCIITIS  . Fatigue  . Elevated transaminase level  . Anxiety  . Paranoia  . Yeast vaginitis  . Viral URI with cough   Past Medical History  Diagnosis Date  . Hypertension   . Hyperlipidemia   . Diabetes mellitus     type II controlled  . Palpitations   . Obesity   . Migraine     common  . Vertigo   . Plantar fasciitis   . S/P tonsillectomy    Past Surgical History  Procedure Date  . Bladder surgery 1990    bladder tack after 2nd delivery   History  Substance Use Topics  . Smoking status: Never Smoker   . Smokeless tobacco: Not on file  . Alcohol Use: No   Family History  Problem Relation Age of Onset  . Arthritis Mother     RA  . Hypertension Mother   . Drug abuse Mother   . Depression Mother   . Cancer Sister     thyroid cancer, reoccured 2008  . Depression Sister   . Drug abuse Brother   . Stroke Maternal Grandmother   . Alzheimer's disease Maternal Grandmother   . Depression Sister   . Depression Sister    No  Known Allergies Current Outpatient Prescriptions on File Prior to Visit  Medication Sig Dispense Refill  . aspirin 81 MG tablet Take 81 mg by mouth daily.        . cetirizine (ZYRTEC) 10 MG tablet TAKE ONE TABLET BY MOUTH EVERY DAY  30 tablet  10  . citalopram (CELEXA) 10 MG tablet Take 1 tablet (10 mg total) by mouth daily.  30 tablet  11  . eletriptan (RELPAX) 40 MG tablet One tablet by mouth as needed for migraine headache.  If the headache improves and then returns, dose may be repeated after 2 hours have elapsed since first dose (do not exceed 80 mg per day). 1 tablet by mouth at onset of headache, repeat in 2 hours if not resolved.       Marland Kitchen glimepiride (AMARYL) 1 MG tablet TAKE ONE TABLET BY MOUTH EVERY DAY  30 tablet  6  . glucose blood test strip test once daily or as needed. DM 250.00  100  each  12  . hyoscyamine (LEVSIN SL) 0.125 MG SL tablet Place 0.125 mg under the tongue every 6 (six) hours as needed. for abdominal cramps.       . lansoprazole (PREVACID 24HR) 15 MG capsule Take 1 capsule (15 mg total) by mouth daily.  90 capsule  3  . lisinopril (PRINIVIL,ZESTRIL) 20 MG tablet TAKE ONE TABLET BY MOUTH EVERY DAY  30 tablet  11  . metFORMIN (GLUCOPHAGE-XR) 500 MG 24 hr tablet TAKE TWO TABLETS BY MOUTH IN THE EVENING  60 tablet  11  . naproxen sodium (ALEVE) 220 MG tablet 2 tablets by mouth once daily as needed.       Marland Kitchen ZETIA 10 MG tablet TAKE ONE TABLET BY MOUTH EVERY DAY  30 each  11  . ALPRAZolam (XANAX) 0.25 MG tablet Take 1 tablet (0.25 mg total) by mouth 3 (three) times daily as needed for anxiety.  30 tablet  0  . Cholecalciferol (VITAMIN D3) 1000 UNITS CAPS Take 1 capsule by mouth daily.         Review of Systems Per HPI    Objective:   Physical Exam  Nursing note and vitals reviewed. Constitutional: She appears well-developed and well-nourished. No distress.  HENT:  Head: Normocephalic and atraumatic.  Mouth/Throat: Oropharynx is clear and moist. No oropharyngeal exudate.  Eyes: Conjunctivae and EOM are normal. Pupils are equal, round, and reactive to light.  Neck: Normal range of motion. Neck supple.  Cardiovascular: Normal rate, regular rhythm, normal heart sounds and intact distal pulses.   No murmur heard. Pulmonary/Chest: Effort normal and breath sounds normal. No respiratory distress. She has no wheezes. She has no rales.  Musculoskeletal: She exhibits no edema.  Lymphadenopathy:    She has no cervical adenopathy.  Skin: Skin is warm and dry. No rash noted.  Psychiatric: She has a normal mood and affect.       Assessment & Plan:

## 2011-12-07 NOTE — Telephone Encounter (Signed)
I'm glad bp is better that is reassuring Please keep me posted

## 2011-12-08 NOTE — Telephone Encounter (Signed)
Left vm for pt to callback 

## 2011-12-08 NOTE — Telephone Encounter (Signed)
Patient notified as instructed by telephone. Pt said Dr Reece Agar wanted pt to f/u with Dr Milinda Antis first of April and see cardiologist also. Asher Muir is going to speak with pt now about cardiology referral but pt said she is not going to make f/u with Dr Milinda Antis unless she feels necessary.

## 2011-12-08 NOTE — Telephone Encounter (Signed)
Go ahead and see cardiology first and then f/u with me if still not feeling right -- try to take care of herself

## 2011-12-09 NOTE — Telephone Encounter (Signed)
Left vm for pt to callback 

## 2011-12-15 NOTE — Telephone Encounter (Signed)
Patient notified as instructed by telephone. 

## 2012-01-05 ENCOUNTER — Ambulatory Visit: Payer: BC Managed Care – PPO | Admitting: Cardiovascular Disease

## 2012-01-06 ENCOUNTER — Ambulatory Visit: Payer: BC Managed Care – PPO | Admitting: Cardiovascular Disease

## 2012-01-08 ENCOUNTER — Ambulatory Visit: Payer: BC Managed Care – PPO | Admitting: Cardiovascular Disease

## 2012-01-08 ENCOUNTER — Encounter: Payer: Self-pay | Admitting: Cardiovascular Disease

## 2012-01-08 ENCOUNTER — Ambulatory Visit (INDEPENDENT_AMBULATORY_CARE_PROVIDER_SITE_OTHER): Payer: BC Managed Care – PPO | Admitting: Cardiovascular Disease

## 2012-01-08 VITALS — BP 132/74 | Ht 64.0 in | Wt 208.5 lb

## 2012-01-08 DIAGNOSIS — F411 Generalized anxiety disorder: Secondary | ICD-10-CM

## 2012-01-08 DIAGNOSIS — R0789 Other chest pain: Secondary | ICD-10-CM

## 2012-01-08 DIAGNOSIS — I10 Essential (primary) hypertension: Secondary | ICD-10-CM

## 2012-01-08 DIAGNOSIS — R9431 Abnormal electrocardiogram [ECG] [EKG]: Secondary | ICD-10-CM

## 2012-01-08 DIAGNOSIS — E119 Type 2 diabetes mellitus without complications: Secondary | ICD-10-CM

## 2012-01-08 DIAGNOSIS — F419 Anxiety disorder, unspecified: Secondary | ICD-10-CM

## 2012-01-08 NOTE — Assessment & Plan Note (Signed)
Blood pressure is well controlled on today's visit. No changes made to the medications. 

## 2012-01-08 NOTE — Assessment & Plan Note (Signed)
Anxiety has significantly improved on Xanax and Celexa.

## 2012-01-08 NOTE — Patient Instructions (Signed)
You are doing well. No medication changes were made.  Please call if you have worsening chest pain, we would do a chest pain  Please call us if you have new issues that need to be addressed before your next appt.  Your physician wants you to follow-up in: 12 months.  You will receive a reminder letter in the mail two months in advance. If you don't receive a letter, please call our office to schedule the follow-up appointment.

## 2012-01-08 NOTE — Assessment & Plan Note (Signed)
Poor R wave progression through the anterior precordial leads is subtle and given she is asymptomatic with exertion, no further workup at this time. Treadmill will be ordered if she has any worsening or recurrent symptoms. Finding could be secondary to lead placement.

## 2012-01-08 NOTE — Progress Notes (Signed)
Patient ID: Stacy Monroe, female    DOB: 02-13-1966, 46 y.o.   MRN: [id number]  HPI Comments: Stacy Monroe is a very pleasant 46 year old woman with obesity, diabetes, hypertension, hyperlipidemia who presents by referral from Dr. Sharen Hones for abnormal EKG and recent chest pain.  She reports that she has had chest pain at nighttime over the past several weeks. It has been a particularly stressful. As her son has primary biliary disease with worsening liver function and long hospital course at [facility]. She has been on days in the hospital. Through this period, she had significant stress and would wake up at nighttime with chest discomfort. She does not have reproducible chest pain with exertion. She does have mild shortness of breath with exertion but she does not do any exercise. She is trying to work on her weight. She feels she is down 5 pounds by eating better.   She denies any prior cardiac history. Her mother did have strokes or MIs, father died of complications from penicillin allergy at an early age.  EKG done on 12/04/2011 shows normal sinus rhythm with rate 77 beats per minute with poor R wave progression through the anterior precordial leads, nonspecific ST abnormality.   Outpatient Encounter Prescriptions as of 01/08/2012  Medication Sig Dispense Refill  . ALPRAZolam (XANAX) 0.25 MG tablet Take 1 tablet (0.25 mg total) by mouth 3 (three) times daily as needed for anxiety.  30 tablet  0  . aspirin 81 MG tablet Take 81 mg by mouth daily.        Marland Kitchen atorvastatin (LIPITOR) 20 MG tablet Take 1 tablet (20 mg total) by mouth daily.  30 tablet  11  . cetirizine (ZYRTEC) 10 MG tablet TAKE ONE TABLET BY MOUTH EVERY DAY  30 tablet  10  . Cholecalciferol (VITAMIN D3) 1000 UNITS CAPS Take 1 capsule by mouth daily.        . citalopram (CELEXA) 10 MG tablet Take 1 tablet (10 mg total) by mouth daily.  30 tablet  11  . eletriptan (RELPAX) 40 MG tablet One tablet by mouth as needed for migraine headache.  If  the headache improves and then returns, dose may be repeated after 2 hours have elapsed since first dose (do not exceed 80 mg per day). 1 tablet by mouth at onset of headache, repeat in 2 hours if not resolved.       Marland Kitchen glimepiride (AMARYL) 1 MG tablet TAKE ONE TABLET BY MOUTH EVERY DAY  30 tablet  6  . glucose blood test strip test once daily or as needed. DM 250.00  100 each  12  . hyoscyamine (LEVSIN SL) 0.125 MG SL tablet Place 0.125 mg under the tongue every 6 (six) hours as needed. for abdominal cramps.       . lansoprazole (PREVACID 24HR) 15 MG capsule Take 1 capsule (15 mg total) by mouth daily.  90 capsule  3  . lisinopril (PRINIVIL,ZESTRIL) 20 MG tablet TAKE ONE TABLET BY MOUTH EVERY DAY  30 tablet  11  . metFORMIN (GLUCOPHAGE-XR) 500 MG 24 hr tablet TAKE TWO TABLETS BY MOUTH IN THE EVENING  60 tablet  11  . Multiple Vitamin (MULTIVITAMIN) tablet Take 1 tablet by mouth daily.      . naproxen sodium (ALEVE) 220 MG tablet 2 tablets by mouth once daily as needed.       Marland Kitchen ZETIA 10 MG tablet TAKE ONE TABLET BY MOUTH EVERY DAY  30 each  11   Review of  Systems  Constitutional: Negative.   HENT: Negative.   Eyes: Negative.   Respiratory: Positive for shortness of breath.   Cardiovascular: Positive for chest pain.  Gastrointestinal: Negative.   Musculoskeletal: Negative.   Skin: Negative.   Neurological: Negative.   Hematological: Negative.   Psychiatric/Behavioral: Negative.   All other systems reviewed and are negative.    BP 132/74  Ht 5\' 4"  (1.626 m)  Wt 208 lb 8 oz (94.575 kg)  BMI 35.79 kg/m2  Physical Exam  Nursing note and vitals reviewed. Constitutional: She is oriented to person, place, and time. She appears well-developed and well-nourished.  HENT:  Head: Normocephalic.  Nose: Nose normal.  Mouth/Throat: Oropharynx is clear and moist.  Eyes: Conjunctivae are normal. Pupils are equal, round, and reactive to light.  Neck: Normal range of motion. Neck supple. No JVD  present.  Cardiovascular: Normal rate, regular rhythm, S1 normal, S2 normal, normal heart sounds and intact distal pulses.  Exam reveals no gallop and no friction rub.   No murmur heard. Pulmonary/Chest: Effort normal and breath sounds normal. No respiratory distress. She has no wheezes. She has no rales. She exhibits no tenderness.  Abdominal: Soft. Bowel sounds are normal. She exhibits no distension. There is no tenderness.  Musculoskeletal: Normal range of motion. She exhibits no edema and no tenderness.  Lymphadenopathy:    She has no cervical adenopathy.  Neurological: She is alert and oriented to person, place, and time. Coordination normal.  Skin: Skin is warm and dry. No rash noted. No erythema.  Psychiatric: She has a normal mood and affect. Her behavior is normal. Judgment and thought content normal.         Assessment and Plan

## 2012-01-08 NOTE — Assessment & Plan Note (Signed)
We have encouraged continued exercise, careful diet management in an effort to lose weight. 

## 2012-01-08 NOTE — Assessment & Plan Note (Signed)
Atypical in nature. We have suggested we closely monitor her and if she has additional episodes, particularly with exertion, that we have her complete a treadmill study. She is in agreement with this and will continue her exercise. If she has worsening symptoms, she will call us and we will schedule a treadmill study.

## 2012-02-25 ENCOUNTER — Ambulatory Visit (INDEPENDENT_AMBULATORY_CARE_PROVIDER_SITE_OTHER): Payer: BC Managed Care – PPO | Admitting: Family Medicine

## 2012-02-25 ENCOUNTER — Ambulatory Visit (INDEPENDENT_AMBULATORY_CARE_PROVIDER_SITE_OTHER)
Admission: RE | Admit: 2012-02-25 | Discharge: 2012-02-25 | Disposition: A | Discharge status: Home / Self Care | Payer: BC Managed Care – PPO | Source: Ambulatory Visit | Attending: Family Medicine | Admitting: Family Medicine

## 2012-02-25 ENCOUNTER — Encounter: Payer: Self-pay | Admitting: Family Medicine

## 2012-02-25 VITALS — BP 130/90 | HR 86 | Temp 98.7°F | Ht 64.0 in | Wt 209.5 lb

## 2012-02-25 DIAGNOSIS — M25512 Pain in left shoulder: Secondary | ICD-10-CM

## 2012-02-25 DIAGNOSIS — M25519 Pain in unspecified shoulder: Secondary | ICD-10-CM

## 2012-02-25 DIAGNOSIS — M542 Cervicalgia: Secondary | ICD-10-CM

## 2012-02-25 MED ORDER — MELOXICAM 15 MG PO TABS: 15.0000 mg | ORAL_TABLET | Freq: Every day | ORAL | Status: DC

## 2012-02-25 NOTE — Patient Instructions (Signed)
Use ice/  Cold compress on shoulder whenever you can  Take the mobic with food daily (no over the counter pain medicines with this) xrays today  Then will make further plan

## 2012-02-25 NOTE — Progress Notes (Signed)
Subjective:    Patient ID: Stacy Monroe, female    DOB: [birth year], 46 y.o.   MRN: [id number]  HPI Is here for neck pain  Also some pain going down her L arm coming from her neck  Getting worse over last mo  Is a sharp pain - focused in L neck and down to L arm and shoulder (less often the right)  Generally worse if she is sitting with arm at rest , and worse to lift arm up  Is throbbing all the time  No numbness No loss of grip but hurts to lift things   No trauma to neck or arm  No former accidents Pain is starting to keep her up at night    Used to carry a big purse - stopped it over the past mo  This has not made much of a difference Otherwise no heavy lifting No new exercise program  Patient Active Problem List  Diagnoses  . DIABETES MELLITUS, TYPE II, CONTROLLED  . UNSPECIFIED VITAMIN D DEFICIENCY  . HYPERLIPIDEMIA  . OBESITY  . COMMON MIGRAINE  . HYPERTENSION  . WEIGHT LOSS-ABNORMAL  . Nonspecific (abnormal) findings on radiological and other examination of body structure  . PLANTAR FASCIITIS  . Fatigue  . Elevated transaminase level  . Anxiety  . Paranoia  . Yeast vaginitis  . Chest pressure  . Abnormal EKG  . Left shoulder pain  . Neck pain on left side   Past Medical History  Diagnosis Date  . Hypertension   . Hyperlipidemia   . Diabetes mellitus     type II controlled  . Palpitations   . Obesity   . Migraine     common  . Vertigo   . Plantar fasciitis   . S/P tonsillectomy    Past Surgical History  Procedure Date  . Bladder surgery 1990    bladder tack after 2nd delivery   History  Substance Use Topics  . Smoking status: Never Smoker   . Smokeless tobacco: Not on file  . Alcohol Use: No   Family History  Problem Relation Age of Onset  . Arthritis Mother     RA  . Hypertension Mother   . Drug abuse Mother   . Depression Mother   . Cancer Sister     thyroid cancer, reoccured 2008  . Depression Sister   . Drug abuse Brother   .  Stroke Maternal Grandmother   . Alzheimer's disease Maternal Grandmother   . Depression Sister   . Depression Sister    No Known Allergies Current Outpatient Prescriptions on File Prior to Visit  Medication Sig Dispense Refill  . ALPRAZolam (XANAX) 0.25 MG tablet Take 1 tablet (0.25 mg total) by mouth 3 (three) times daily as needed for anxiety.  30 tablet  0  . aspirin 81 MG tablet Take 81 mg by mouth daily.        Marland Kitchen atorvastatin (LIPITOR) 20 MG tablet Take 1 tablet (20 mg total) by mouth daily.  30 tablet  11  . cetirizine (ZYRTEC) 10 MG tablet TAKE ONE TABLET BY MOUTH EVERY DAY  30 tablet  10  . Cholecalciferol (VITAMIN D3) 1000 UNITS CAPS Take 1 capsule by mouth daily.        . citalopram (CELEXA) 10 MG tablet Take 1 tablet (10 mg total) by mouth daily.  30 tablet  11  . eletriptan (RELPAX) 40 MG tablet One tablet by mouth as needed for migraine headache.  If the headache  improves and then returns, dose may be repeated after 2 hours have elapsed since first dose (do not exceed 80 mg per day). 1 tablet by mouth at onset of headache, repeat in 2 hours if not resolved.       Marland Kitchen glimepiride (AMARYL) 1 MG tablet TAKE ONE TABLET BY MOUTH EVERY DAY  30 tablet  6  . glucose blood test strip test once daily or as needed. DM 250.00  100 each  12  . hyoscyamine (LEVSIN SL) 0.125 MG SL tablet Place 0.125 mg under the tongue every 6 (six) hours as needed. for abdominal cramps.       . lansoprazole (PREVACID 24HR) 15 MG capsule Take 1 capsule (15 mg total) by mouth daily.  90 capsule  3  . lisinopril (PRINIVIL,ZESTRIL) 20 MG tablet TAKE ONE TABLET BY MOUTH EVERY DAY  30 tablet  11  . metFORMIN (GLUCOPHAGE-XR) 500 MG 24 hr tablet TAKE TWO TABLETS BY MOUTH IN THE EVENING  60 tablet  11  . Multiple Vitamin (MULTIVITAMIN) tablet Take 1 tablet by mouth daily.      Marland Kitchen ZETIA 10 MG tablet TAKE ONE TABLET BY MOUTH EVERY DAY  30 each  11  . DISCONTD: ipratropium (ATROVENT) 0.03 % nasal spray Place 2 sprays into the  nose 3 (three) times daily.  30 mL  2      Review of Systems Review of Systems  Constitutional: Negative for fever, appetite change, fatigue and unexpected weight change.  Eyes: Negative for pain and visual disturbance.  Respiratory: Negative for cough and shortness of breath.   Cardiovascular: Negative for cp or palpitations    Gastrointestinal: Negative for nausea, diarrhea and constipation.  Genitourinary: Negative for urgency and frequency.  Skin: Negative for pallor or rash   MSK pos for shoulder and neck pain / neg for joint swelling  Neurological: Negative for weakness, light-headedness, numbness and headaches.  Hematological: Negative for adenopathy. Does not bruise/bleed easily.  Psychiatric/Behavioral: Negative for dysphoric mood. The patient is not nervous/anxious.         Objective:   Physical Exam  Constitutional: She appears well-developed and well-nourished. No distress.  HENT:  Head: Normocephalic and atraumatic.  Mouth/Throat: Oropharynx is clear and moist.  Eyes: Conjunctivae and EOM are normal. Pupils are equal, round, and reactive to light. No scleral icterus.  Neck: Normal range of motion. Neck supple. No JVD present. Carotid bruit is not present. No tracheal deviation present. No thyromegaly present.       Nl rom of neck in all directions- some discomfort with tilt to the R  Some tenderness medial to R shoulder blade with spasm  Cardiovascular: Normal rate, regular rhythm and normal heart sounds.   Pulmonary/Chest: Effort normal and breath sounds normal. No respiratory distress. She has no wheezes.  Abdominal: Soft. Bowel sounds are normal.  Musculoskeletal: She exhibits tenderness. She exhibits no edema.       R shoulder- some mild acromion and bicep tendon tenderness  Pain to abduct over 90 deg passive/actively Pain on int rot Pos hawking and neer tests No redness or swelling   Nl perf nad sens and strength  Lymphadenopathy:    She has no cervical  adenopathy.  Neurological: She is alert. She has normal reflexes. She exhibits normal muscle tone. Coordination normal.  Skin: Skin is warm and dry. No rash noted. No erythema. No pallor.  Psychiatric: She has a normal mood and affect.          Assessment &  Plan:

## 2012-02-25 NOTE — Assessment & Plan Note (Signed)
With pos hawking test and pain on abduction as well as internal rotation  Xray today Trial of meloxicam 15 with food  Then update  Is also having neck pain rad to arm - this may have some input as well

## 2012-02-25 NOTE — Assessment & Plan Note (Signed)
After years of carrying heavy purse (does not do that now)  Nl rom today Some radiation of pain to shoulder meloxicam 15 mg trial with food  Xray today Then update

## 2012-03-03 ENCOUNTER — Ambulatory Visit (INDEPENDENT_AMBULATORY_CARE_PROVIDER_SITE_OTHER): Payer: BC Managed Care – PPO | Admitting: Family Medicine

## 2012-03-03 ENCOUNTER — Encounter: Payer: Self-pay | Admitting: Family Medicine

## 2012-03-03 VITALS — BP 118/76 | HR 72 | Temp 98.4°F | Wt 209.0 lb

## 2012-03-03 DIAGNOSIS — M25819 Other specified joint disorders, unspecified shoulder: Secondary | ICD-10-CM

## 2012-03-03 DIAGNOSIS — M7542 Impingement syndrome of left shoulder: Secondary | ICD-10-CM

## 2012-03-03 DIAGNOSIS — M542 Cervicalgia: Secondary | ICD-10-CM

## 2012-03-03 NOTE — Patient Instructions (Signed)
REFERRAL: GO THE THE FRONT ROOM AT THE ENTRANCE OF OUR CLINIC, NEAR CHECK IN. ASK FOR MARION. SHE WILL HELP YOU SET UP YOUR REFERRAL. DATE: TIME:   F/u 6 weeks 

## 2012-03-03 NOTE — Progress Notes (Signed)
Nature conservation officer at [facility] [address] [place] [address] Phone: [phone] Fax: [phone]   Patient Name: Stacy Monroe Date of Birth: [birth year] Age: 46 y.o. Medical Record Number: [id number] Gender: female Date of Encounter: 03/03/2012  History of Present Illness:  Stacy Monroe is a 46 y.o. very pleasant female patient who presents with the following:  Pain in her lateral shoulder and pain in the shoulder blade on the left. Mostly on the left and has been on the left side.  Still having a lot of pain. Monday it was so bad. Muscle relaxant seemed to help. Meloxicam has not helped.   Has helped some. Mostly on the left.  At least 67mo - 12 months ago.   Long-standing shoulder pain intermittently for the last 6-12 months, but has particularly gotten worse over the last few months. She also has some significant neck pain, and some shoulder blade pain on the LEFT side. She has not noticed any tingling or loss of strength in the hands. No trauma or injury. Abduction, flexion, and terminal internal range of motion caused great deal of pain on LEFT. She has done some anti-inflammatories. No formal rehabilitation. Patient does have diabetes. Motion remains full.  Past Medical History, Surgical History, Social History, Family History, Problem List, Medications, and Allergies have been reviewed and updated if relevant.  Prior to Admission medications   Medication Sig Start Date End Date Taking? Authorizing Provider  ALPRAZolam (XANAX) 0.25 MG tablet Take 1 tablet (0.25 mg total) by mouth 3 (three) times daily as needed for anxiety. 06/24/11 06/23/12 Yes Dianne Dun, MD  aspirin 81 MG tablet Take 81 mg by mouth daily.     Yes Historical Provider, MD  atorvastatin (LIPITOR) 20 MG tablet Take 1 tablet (20 mg total) by mouth daily. 12/04/11 12/03/12 Yes Eustaquio Boyden, MD  cetirizine (ZYRTEC) 10 MG tablet TAKE ONE TABLET BY MOUTH EVERY DAY 10/31/11  Yes Judy Pimple, MD    citalopram (CELEXA) 10 MG tablet Take 1 tablet (10 mg total) by mouth daily. 10/24/11  Yes Judy Pimple, MD  eletriptan (RELPAX) 40 MG tablet One tablet by mouth as needed for migraine headache.  If the headache improves and then returns, dose may be repeated after 2 hours have elapsed since first dose (do not exceed 80 mg per day). 1 tablet by mouth at onset of headache, repeat in 2 hours if not resolved.    Yes Historical Provider, MD  glimepiride (AMARYL) 1 MG tablet TAKE ONE TABLET BY MOUTH EVERY DAY 10/31/11  Yes Marne A Tower, MD  glucose blood test strip test once daily or as needed. DM 250.00 07/08/11  Yes Daelyn Pettaway, MD  hyoscyamine (LEVSIN SL) 0.125 MG SL tablet Place 0.125 mg under the tongue every 6 (six) hours as needed. for abdominal cramps.    Yes Historical Provider, MD  lansoprazole (PREVACID 24HR) 15 MG capsule Take 1 capsule (15 mg total) by mouth daily. 07/25/11  Yes Judy Pimple, MD  lisinopril (PRINIVIL,ZESTRIL) 20 MG tablet TAKE ONE TABLET BY MOUTH EVERY DAY 10/31/11  Yes Judy Pimple, MD  meloxicam (MOBIC) 15 MG tablet Take 1 tablet (15 mg total) by mouth daily. With food for shoulder and neck pain 02/25/12 02/24/13 Yes Judy Pimple, MD  metFORMIN (GLUCOPHAGE-XR) 500 MG 24 hr tablet TAKE TWO TABLETS BY MOUTH IN THE EVENING 10/31/11  Yes Judy Pimple, MD  Multiple Vitamin (MULTIVITAMIN) tablet Take 1 tablet by mouth daily.  Yes Historical Provider, MD  ZETIA 10 MG tablet TAKE ONE TABLET BY MOUTH EVERY DAY 10/31/11  Yes Judy Pimple, MD    Review of Systems:  GEN: No fevers, chills. Nontoxic. Primarily MSK c/o today. MSK: Detailed in the HPI GI: tolerating PO intake without difficulty Neuro: detailed above Otherwise the pertinent positives of the ROS are noted above.    Physical Examination: Filed Vitals:   03/03/12 1537  BP: 118/76  Pulse: 72  Temp: 98.4 F (36.9 C)   Filed Vitals:   03/03/12 1537  Weight: 209 lb (94.802 kg)   There is no height on file to  calculate BMI.   GEN: Well-developed,well-nourished,in no acute distress; alert,appropriate and cooperative throughout examination HEENT: Normocephalic and atraumatic without obvious abnormalities. Ears, externally no deformities PULM: Breathing comfortably in no respiratory distress EXT: No clubbing, cyanosis, or edema PSYCH: Normally interactive. Cooperative during the interview. Pleasant. Friendly and conversant. Not anxious or depressed appearing. Normal, full affect.  Shoulder: L Inspection: No muscle wasting or winging Ecchymosis/edema: neg  AC joint, scapula, clavicle: NT Cervical spine: NT, only mild restriction of motion Spurling's: neg Abduction: full, 5/5 Flexion: full, 5/5 IR, full, lift-off: 5/5 ER at neutral: full, 5/5 AC crossover: neg Neer: pos Hawkins: pos Drop Test: neg Empty Can: pos Supraspinatus insertion: mild-mod T Bicipital groove: NT Speed's: neg Yergason's: neg Sulcus sign: neg Scapular dyskinesis: none C5-T1 intact  Neuro: Sensation intact Grip 5/5  Data:  Dg Cervical Spine Complete  02/25/2012  *RADIOLOGY REPORT*  Clinical Data: Neck pain on the left side.  Pain radiates into the left arm.  CERVICAL SPINE - COMPLETE 4+ VIEW  Comparison: None.  Findings: No prevertebral soft tissue swelling is evident. Intervertebral disc spaces are maintained.  Alignment is normal. No fracture, bony destruction, or spondylosis is evident.  There is nuchal ligament calcification posterior to the spinous processes of C6 and C7.  Foramina appear patent.  No cervical ribs are seen.  IMPRESSION: No fracture, subluxation, or other cervical spine lesion is evident.  Minimal nuchal ligament calcification.  Original Report Authenticated By: Crawford Givens, M.D.   Dg Shoulder Left  02/25/2012  *RADIOLOGY REPORT*  Clinical Data: History of left shoulder pain.  LEFT SHOULDER - 2+ VIEW  Comparison: None.  Findings: Alignment is normal.  Joint spaces are preserved.  No fracture or  dislocation is evident.  No soft tissue lesions are seen.  No calcific bursitis or calcific tendonitis is seen.  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  IMPRESSION: No fracture or arthritic process is seen.  No calcific bursitis, calcific tendonitis, or bony destruction is seen.  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  This may be associated with impingement and rotator cuff pathology in some patients.  Original Report Authenticated By: Crawford Givens, M.D.    Assessment and Plan:  1. Shoulder impingement, left  Ambulatory referral to Physical Therapy  2. Neck pain  Ambulatory referral to Physical Therapy   Very significant impingement phenomenon. She also has some by history classic referred neck pain to the shoulder blade.  Normal PT and home exercise program for rotator cuff strengthening and scapular stabilization.  For now, also injected the shoulder  SubAC Injection, L Verbal consent was obtained from the patient. Risks (including infection), benefits, and alternatives were explained. Patient prepped with Chloraprep and Ethyl Chloride used for anesthesia. The subacromial space was injected using the posterior approach. The patient tolerated the procedure well and had decreased pain post injection.  No complications. Injection: 9 cc of Lidocaine 1% and 1cc of Depo-Medrol 40 mg. Needle: 22 gauge   Orders Today: Orders Placed This Encounter  Procedures  . Ambulatory referral to Physical Therapy    Referral Priority:  Routine    Referral Type:  Physical Medicine    Referral Reason:  Specialty Services Required    Requested Specialty:  Physical Therapy    Number of Visits Requested:  1    Medications Today: No orders of the defined types were placed in this encounter.     Hannah Beat, MD

## 2012-04-16 ENCOUNTER — Ambulatory Visit: Payer: BC Managed Care – PPO | Admitting: Family Medicine

## 2012-04-21 ENCOUNTER — Ambulatory Visit: Payer: BC Managed Care – PPO | Admitting: Family Medicine

## 2012-06-08 ENCOUNTER — Other Ambulatory Visit: Payer: Self-pay | Admitting: Family Medicine

## 2012-06-09 ENCOUNTER — Other Ambulatory Visit: Payer: Self-pay

## 2012-06-09 MED ORDER — GLIMEPIRIDE 1 MG PO TABS: 1.0000 mg | ORAL_TABLET | Freq: Every day | ORAL | Status: DC

## 2012-06-09 NOTE — Telephone Encounter (Signed)
Request for Glimepiride 1 mg #30 6R sent electronic to [facility].

## 2012-06-25 ENCOUNTER — Encounter: Payer: Self-pay | Admitting: Family Medicine

## 2012-06-25 ENCOUNTER — Ambulatory Visit (INDEPENDENT_AMBULATORY_CARE_PROVIDER_SITE_OTHER): Payer: BC Managed Care – PPO | Admitting: Family Medicine

## 2012-06-25 VITALS — BP 118/76 | HR 80 | Temp 98.0°F | Resp 20 | Ht 64.0 in | Wt 206.0 lb

## 2012-06-25 DIAGNOSIS — H6690 Otitis media, unspecified, unspecified ear: Secondary | ICD-10-CM

## 2012-06-25 MED ORDER — AMOXICILLIN-POT CLAVULANATE 875-125 MG PO TABS: 1.0000 | ORAL_TABLET | Freq: Two times a day (BID) | ORAL | Status: DC

## 2012-06-25 NOTE — Progress Notes (Signed)
SUBJECTIVE:  Stacy Monroe is a 46 y.o. female who complains of URI symptoms- cough, runny nose, sinus pressure for a week. Past two days- severe right ear pain, now radiating to her right face and jaw. No fevers but did have chills. Right her is not painful to manipulation or touch. No drainage from ear that she is aware of. No h/o trauma to ear.  Patient Active Problem List  Diagnosis  . DIABETES MELLITUS, TYPE II, CONTROLLED  . UNSPECIFIED VITAMIN D DEFICIENCY  . HYPERLIPIDEMIA  . OBESITY  . COMMON MIGRAINE  . HYPERTENSION  . WEIGHT LOSS-ABNORMAL  . Nonspecific (abnormal) findings on radiological and other examination of body structure  . PLANTAR FASCIITIS  . Fatigue  . Elevated transaminase level  . Anxiety  . Paranoia  . Yeast vaginitis  . Chest pressure  . Abnormal EKG  . Left shoulder pain  . Neck pain on left side   Past Medical History  Diagnosis Date  . Hypertension   . Hyperlipidemia   . Diabetes mellitus     type II controlled  . Palpitations   . Obesity   . Migraine     common  . Vertigo   . Plantar fasciitis   . S/P tonsillectomy    Past Surgical History  Procedure Date  . Bladder surgery 1990    bladder tack after 2nd delivery   History  Substance Use Topics  . Smoking status: Never Smoker   . Smokeless tobacco: Never Used  . Alcohol Use: No   Family History  Problem Relation Age of Onset  . Arthritis Mother     RA  . Hypertension Mother   . Drug abuse Mother   . Depression Mother   . Cancer Sister     thyroid cancer, reoccured 2008  . Depression Sister   . Drug abuse Brother   . Stroke Maternal Grandmother   . Alzheimer's disease Maternal Grandmother   . Depression Sister   . Depression Sister    No Known Allergies Current Outpatient Prescriptions on File Prior to Visit  Medication Sig Dispense Refill  . aspirin 81 MG tablet Take 81 mg by mouth daily.        Marland Kitchen atorvastatin (LIPITOR) 20 MG tablet Take 1 tablet (20 mg total) by  mouth daily.  30 tablet  11  . cetirizine (ZYRTEC) 10 MG tablet TAKE ONE TABLET BY MOUTH EVERY DAY  30 tablet  10  . citalopram (CELEXA) 10 MG tablet Take 1 tablet (10 mg total) by mouth daily.  30 tablet  11  . glimepiride (AMARYL) 1 MG tablet Take 1 tablet (1 mg total) by mouth daily before breakfast.  30 tablet  6  . glucose blood test strip test once daily or as needed. DM 250.00  100 each  12  . lansoprazole (PREVACID 24HR) 15 MG capsule Take 1 capsule (15 mg total) by mouth daily.  90 capsule  3  . lisinopril (PRINIVIL,ZESTRIL) 20 MG tablet TAKE ONE TABLET BY MOUTH EVERY DAY  30 tablet  11  . metFORMIN (GLUCOPHAGE-XR) 500 MG 24 hr tablet TAKE TWO TABLETS BY MOUTH IN THE EVENING  60 tablet  11  . ZETIA 10 MG tablet TAKE ONE TABLET BY MOUTH EVERY DAY  30 each  11  . eletriptan (RELPAX) 40 MG tablet One tablet by mouth as needed for migraine headache.  If the headache improves and then returns, dose may be repeated after 2 hours have elapsed since first dose (do  not exceed 80 mg per day). 1 tablet by mouth at onset of headache, repeat in 2 hours if not resolved.       . hyoscyamine (LEVSIN SL) 0.125 MG SL tablet Place 0.125 mg under the tongue every 6 (six) hours as needed. for abdominal cramps.       . meloxicam (MOBIC) 15 MG tablet Take 1 tablet (15 mg total) by mouth daily. With food for shoulder and neck pain  30 tablet  0  . Multiple Vitamin (MULTIVITAMIN) tablet Take 1 tablet by mouth daily.      Marland Kitchen DISCONTD: ipratropium (ATROVENT) 0.03 % nasal spray Place 2 sprays into the nose 3 (three) times daily.  30 mL  2   The PMH, PSH, Social History, Family History, Medications, and allergies have been reviewed in [facility], and have been updated if relevant.    OBJECTIVE: BP 118/76  Pulse 80  Temp 98 F (36.7 C) (Oral)  Resp 20  Ht 5\' 4"  (1.626 m)  Wt 206 lb (93.441 kg)  BMI 35.36 kg/m2  SpO2 97%  She appears well, vital signs are as noted. Right TM:  TM bulding, erythematous. Throat and  pharynx normal.  Neck supple. No adenopathy in the neck. Nose is congested. Sinuses non tender. The chest is clear, without wheezes or rales.  ASSESSMENT:  serous otitis  PLAN: Augmentin 875 mg twice daily x 10 days. Symptomatic therapy suggested: push fluids, rest and return office visit prn if symptoms persist or worsen.  Call or return to clinic prn if these symptoms worsen or fail to improve as anticipated.

## 2012-06-25 NOTE — Patient Instructions (Addendum)
It was good to see you. You have an inner ear infection. Please take Augmentin as directed- 1 tablet twice daily x 10 days.  Continue Mucinex and alleve. Call us next if no improvement.

## 2012-06-28 ENCOUNTER — Encounter: Payer: Self-pay | Admitting: Family Medicine

## 2012-06-28 ENCOUNTER — Ambulatory Visit (INDEPENDENT_AMBULATORY_CARE_PROVIDER_SITE_OTHER): Payer: BC Managed Care – PPO | Admitting: Family Medicine

## 2012-06-28 ENCOUNTER — Telehealth: Payer: Self-pay | Admitting: Family Medicine

## 2012-06-28 VITALS — BP 118/82 | HR 76 | Temp 98.2°F | Ht 63.0 in | Wt 207.5 lb

## 2012-06-28 DIAGNOSIS — H6691 Otitis media, unspecified, right ear: Secondary | ICD-10-CM | POA: Insufficient documentation

## 2012-06-28 DIAGNOSIS — H6690 Otitis media, unspecified, unspecified ear: Secondary | ICD-10-CM

## 2012-06-28 MED ORDER — HYDROCODONE-ACETAMINOPHEN 5-500 MG PO TABS: 1.0000 | ORAL_TABLET | Freq: Every evening | ORAL | Status: DC | PRN

## 2012-06-28 NOTE — Assessment & Plan Note (Signed)
Improved on exam but pt still having significant pain that also radiates to jaw and teeth  Will try to decongest cautiously with sudafed prn -warned about side effects Also nasal saline spray  Given vicodin 5-500 to take at night for pain If no further imp (or worse)- inst to call for ENT referral

## 2012-06-28 NOTE — Telephone Encounter (Signed)
Caller: [name]/Patient; Patient Name: Stacy Monroe; PCP: Roxy Manns [facility]); Best Callback Phone Number: [phone]. Was seen in office on 06/25/12 and was diagnosed with a right ear infection. Patient is currently taking Augmentin for the infection. Afebrile. Reports severe pain with no relief from Aleve that she was directed to take. Warm compresses seem to help a little, but the pain is still there. Reports partial hearing loss in the ear as well. Reports pain as a stabbing, throbbing pain at this time that is continuous. Patient also feels pain in her mouth and is having trouble swallowing. Reports swelling to the jaw and her teeth hurt as well. Emergent symptom of "Severe pain unresponsive to 24 hours of home care" positive per Ear: Symptoms guideline. Disposition: See provider within 4 hours. Appointment scheduled with Dr. Milinda Antis 06/28/12 at 11:45am. Care advice and call back parameters given per guideline. Also advised she may try a cool compress since the warm compress is not relieving ear pain. Patient verbalized understanding.

## 2012-06-28 NOTE — Telephone Encounter (Signed)
Seen in office.

## 2012-06-28 NOTE — Patient Instructions (Addendum)
Try some nasal saline spray to clear sinuses  Try some sudafed- short acting type - just during the day - for sinus congestion  Try warm compress on face  Continue the augmentin  Try the vicodin as needed at night for pain  If worse or not improved in the next 2 days- please call and we will do a ref to ENT

## 2012-06-28 NOTE — Progress Notes (Signed)
Subjective:    Patient ID: Stacy Monroe, female    DOB: Apr 07, 1966, 46 y.o.   MRN: [id number]  HPI Here for ongoing ear / jaw/ mouth pain   Was dx on 9/27 with ROM On augmentin -- twice daily   Symptoms started 1 week ago with congestion/ runny nose  Started on mucinex  By end of that week her R ear started to hurt really bad   Now pain is in jaw on R side also  Really hurting   No fever  No drainage from ear  Still a lot of congestion  Also a headache - whole head  Throat sore on R side   Takes some aleve for pain -does not really help much   Patient Active Problem List  Diagnosis  . DIABETES MELLITUS, TYPE II, CONTROLLED  . UNSPECIFIED VITAMIN D DEFICIENCY  . HYPERLIPIDEMIA  . OBESITY  . COMMON MIGRAINE  . HYPERTENSION  . WEIGHT LOSS-ABNORMAL  . Nonspecific (abnormal) findings on radiological and other examination of body structure  . PLANTAR FASCIITIS  . Fatigue  . Elevated transaminase level  . Anxiety  . Paranoia  . Yeast vaginitis  . Chest pressure  . Abnormal EKG  . Left shoulder pain  . Neck pain on left side   Past Medical History  Diagnosis Date  . Hypertension   . Hyperlipidemia   . Diabetes mellitus     type II controlled  . Palpitations   . Obesity   . Migraine     common  . Vertigo   . Plantar fasciitis   . S/P tonsillectomy    Past Surgical History  Procedure Date  . Bladder surgery 1990    bladder tack after 2nd delivery   History  Substance Use Topics  . Smoking status: Never Smoker   . Smokeless tobacco: Never Used  . Alcohol Use: No   Family History  Problem Relation Age of Onset  . Arthritis Mother     RA  . Hypertension Mother   . Drug abuse Mother   . Depression Mother   . Cancer Sister     thyroid cancer, reoccured 2008  . Depression Sister   . Drug abuse Brother   . Stroke Maternal Grandmother   . Alzheimer's disease Maternal Grandmother   . Depression Sister   . Depression Sister    No Known  Allergies Current Outpatient Prescriptions on File Prior to Visit  Medication Sig Dispense Refill  . amoxicillin-clavulanate (AUGMENTIN) 875-125 MG per tablet Take 1 tablet by mouth 2 (two) times daily.  20 tablet  0  . aspirin 81 MG tablet Take 81 mg by mouth daily.        Marland Kitchen atorvastatin (LIPITOR) 20 MG tablet Take 1 tablet (20 mg total) by mouth daily.  30 tablet  11  . cetirizine (ZYRTEC) 10 MG tablet TAKE ONE TABLET BY MOUTH EVERY DAY  30 tablet  10  . citalopram (CELEXA) 10 MG tablet Take 1 tablet (10 mg total) by mouth daily.  30 tablet  11  . eletriptan (RELPAX) 40 MG tablet One tablet by mouth as needed for migraine headache.  If the headache improves and then returns, dose may be repeated after 2 hours have elapsed since first dose (do not exceed 80 mg per day). 1 tablet by mouth at onset of headache, repeat in 2 hours if not resolved.       Marland Kitchen glimepiride (AMARYL) 1 MG tablet Take 1 tablet (1 mg total) by  mouth daily before breakfast.  30 tablet  6  . glucose blood test strip test once daily or as needed. DM 250.00  100 each  12  . GuaiFENesin (MUCINEX MAXIMUM STRENGTH PO) Take by mouth daily.      . hyoscyamine (LEVSIN SL) 0.125 MG SL tablet Place 0.125 mg under the tongue every 6 (six) hours as needed. for abdominal cramps.       . lansoprazole (PREVACID 24HR) 15 MG capsule Take 1 capsule (15 mg total) by mouth daily.  90 capsule  3  . lisinopril (PRINIVIL,ZESTRIL) 20 MG tablet TAKE ONE TABLET BY MOUTH EVERY DAY  30 tablet  11  . metFORMIN (GLUCOPHAGE-XR) 500 MG 24 hr tablet TAKE TWO TABLETS BY MOUTH IN THE EVENING  60 tablet  11  . Multiple Vitamin (MULTIVITAMIN) tablet Take 1 tablet by mouth daily.      . Naproxen Sodium (ALEVE PO) Take by mouth. As directed      . ZETIA 10 MG tablet TAKE ONE TABLET BY MOUTH EVERY DAY  30 each  11  . meloxicam (MOBIC) 15 MG tablet Take 1 tablet (15 mg total) by mouth daily. With food for shoulder and neck pain  30 tablet  0  . DISCONTD: ipratropium  (ATROVENT) 0.03 % nasal spray Place 2 sprays into the nose 3 (three) times daily.  30 mL  2       Review of Systems Review of Systems  Constitutional: Negative for fever, appetite change, fatigue and unexpected weight change.  Eyes: Negative for pain and visual disturbance.  ENT pos for ear pan and sinus pain on the R,neg for dental abcess, pos for nasal congestion Respiratory: Negative for sob or wheeze   Cardiovascular: Negative for cp or palpitations    Gastrointestinal: Negative for nausea, diarrhea and constipation.  Genitourinary: Negative for urgency and frequency.  Skin: Negative for pallor or rash   Neurological: Negative for weakness, light-headedness, numbness and headaches.  Hematological: Negative for adenopathy. Does not bruise/bleed easily.  Psychiatric/Behavioral: Negative for dysphoric mood. The patient is not nervous/anxious.         Objective:   Physical Exam  Constitutional: She appears well-developed and well-nourished. No distress.       Obese and appears fatigued   HENT:  Head: Normocephalic and atraumatic.  Left Ear: External ear normal.  Mouth/Throat: Oropharynx is clear and moist. No oropharyngeal exudate.       Nares are injected and congested  bilat frontal and ethmoid sinus tenderness Throat- clear post drainage  No exudate or swelling  L TM clear R TM - mod effusion without perf, no erythema or bulging   Eyes: Conjunctivae normal and EOM are normal. Pupils are equal, round, and reactive to light. Right eye exhibits no discharge. Left eye exhibits no discharge.  Neck: Normal range of motion. Neck supple.       Anterior cervical adenopathy on R   Cardiovascular: Normal rate, regular rhythm, normal heart sounds and intact distal pulses.   No murmur heard. Pulmonary/Chest: Effort normal and breath sounds normal. No respiratory distress. She has no wheezes.  Abdominal: Soft. Bowel sounds are normal.  Lymphadenopathy:    She has cervical adenopathy.   Neurological: She is alert. She has normal reflexes. No cranial nerve deficit. She exhibits normal muscle tone. Coordination normal.  Skin: Skin is warm and dry. No rash noted. No erythema. No pallor.  Psychiatric: She has a normal mood and affect.  Assessment & Plan:

## 2012-07-15 ENCOUNTER — Other Ambulatory Visit: Payer: Self-pay

## 2012-07-15 DIAGNOSIS — F419 Anxiety disorder, unspecified: Secondary | ICD-10-CM

## 2012-07-15 MED ORDER — ELETRIPTAN HYDROBROMIDE 40 MG PO TABS: ORAL_TABLET | ORAL | Status: DC

## 2012-07-15 MED ORDER — ALPRAZOLAM 0.25 MG PO TABS: 0.2500 mg | ORAL_TABLET | Freq: Three times a day (TID) | ORAL | Status: DC | PRN

## 2012-07-15 NOTE — Telephone Encounter (Signed)
Rx's called in as directed.  

## 2012-07-15 NOTE — Telephone Encounter (Signed)
Px written for call in   

## 2012-07-15 NOTE — Telephone Encounter (Signed)
Pt request refill alprazolam and relpax sent to [facility] [address]. Please advise.

## 2012-07-19 ENCOUNTER — Other Ambulatory Visit: Payer: Self-pay | Admitting: Family Medicine

## 2012-07-19 MED ORDER — LANSOPRAZOLE 15 MG PO CPDR: 15.0000 mg | DELAYED_RELEASE_CAPSULE | Freq: Every day | ORAL | Status: DC

## 2012-07-19 NOTE — Telephone Encounter (Signed)
[facility] [address] faxed refill Prevacid #90 x 3.

## 2012-08-13 ENCOUNTER — Other Ambulatory Visit: Payer: Self-pay | Admitting: Family Medicine

## 2012-08-16 NOTE — Telephone Encounter (Signed)
Px written for call in   

## 2012-08-16 NOTE — Telephone Encounter (Signed)
Rx called in as prescribed 

## 2012-08-16 NOTE — Telephone Encounter (Signed)
Ok to refill 

## 2012-08-19 ENCOUNTER — Telehealth: Payer: Self-pay | Admitting: Family Medicine

## 2012-08-19 NOTE — Telephone Encounter (Signed)
Since it has been a while from original tx she needs to be seen and evaluated

## 2012-08-19 NOTE — Telephone Encounter (Signed)
Patient Information:  Caller Name: Doreen Salvage  Phone: [phone]  Patient: Stacy Monroe, Stacy Monroe  Gender: Female  DOB: [birth year]  Age: 46 Years  PCP: Roxy Manns [facility])  Pregnant: No   Symptoms  Reason For Call & Symptoms: Right ear pain; denies drnge; Had OM in 09/13.  Ear is painful when exposed to cold air. Tenderness and swollen lymph node along right jaw line.  Reviewed Health History In EMR: Yes  Reviewed Medications In EMR: Yes  Reviewed Allergies In EMR: Yes  Date of Onset of Symptoms: 08/17/2012  Treatments Tried: Aleve  Treatments Tried Worked: No OB:  LMP: 07/26/2012  Guideline(s) Used:  Earache  Disposition Per Guideline:   See Today in Office  Reason For Disposition Reached:   Diabetes mellitus or a weak immune system (e.g., HIV positive, cancer chemotherapy, transplant patient)  Advice Given:  Call Back If  You become worse.  Office Follow Up:  Does the office need to follow up with this patient?: Yes  Instructions For The Office: Pt. had OM in 09/13 and placed on  Augmentin. She is requesting to have Augmentin called into [facility] on [address]  [phone] Please call pt with whether this has been approved.  RN Overrode Recommendation:  Patient Requests Prescription  Would rather try abx first and if no better then come in for OV.

## 2012-08-19 NOTE — Telephone Encounter (Signed)
LMOVM to schedule appt 

## 2012-09-08 ENCOUNTER — Ambulatory Visit (INDEPENDENT_AMBULATORY_CARE_PROVIDER_SITE_OTHER): Payer: BC Managed Care – PPO | Admitting: Family Medicine

## 2012-09-08 ENCOUNTER — Encounter: Payer: Self-pay | Admitting: Family Medicine

## 2012-09-08 VITALS — BP 132/74 | HR 69 | Temp 98.0°F | Ht 63.0 in | Wt 209.8 lb

## 2012-09-08 DIAGNOSIS — H6690 Otitis media, unspecified, unspecified ear: Secondary | ICD-10-CM

## 2012-09-08 DIAGNOSIS — E785 Hyperlipidemia, unspecified: Secondary | ICD-10-CM

## 2012-09-08 DIAGNOSIS — E119 Type 2 diabetes mellitus without complications: Secondary | ICD-10-CM

## 2012-09-08 DIAGNOSIS — H6691 Otitis media, unspecified, right ear: Secondary | ICD-10-CM

## 2012-09-08 LAB — COMPREHENSIVE METABOLIC PANEL
ALT: 41 U/L — ABNORMAL HIGH (ref 0–35)
AST: 31 U/L (ref 0–37)
Albumin: 4.3 g/dL (ref 3.5–5.2)
Alkaline Phosphatase: 46 U/L (ref 39–117)
Potassium: 4.3 mEq/L (ref 3.5–5.1)
Sodium: 139 mEq/L (ref 135–145)
Total Bilirubin: 0.5 mg/dL (ref 0.3–1.2)
Total Protein: 7.1 g/dL (ref 6.0–8.3)

## 2012-09-08 LAB — LIPID PANEL
HDL: 37.1 mg/dL — ABNORMAL LOW (ref >39.00)
LDL Cholesterol: 80 mg/dL (ref 0–99)
Total CHOL/HDL Ratio: 4
VLDL: 19.8 mg/dL (ref 0.0–40.0)

## 2012-09-08 NOTE — Progress Notes (Signed)
Subjective:    Patient ID: Stacy Monroe, female    DOB: 05-20-1966, 46 y.o.   MRN: [id number]  HPI Here for f/u of DM and other chronic problems   bp is stable today  No cp or palpitations or headaches or edema  No side effects to medicines  BP Readings from Last 3 Encounters:  09/08/12 132/74  06/28/12 118/82  06/25/12 118/76     Diabetes Home sugar results has been going up lately - does not know why (some stress with school)  ams - is averaging low 200s , evenings after dinner as high as low 300s  DM diet - is not perfect , but not bad -- eat some carbs but watches them- that has not changed  Had an ear infection 2 mo ago - ? If totally over that - still has discomfort under her ear  Exercise -no regular exercise - will not start -- no time  Has done DM teaching  Symptoms A1C last  Lab Results  Component Value Date   HGBA1C 6.0 10/17/2011    No problems with medications amaryl and metformin Renal protection-- on ace  Last eye exam - 11/2 years ago-needs to make that appt  Is making an appt with dentist    Wt is up 2 lb with bmi of 37  Due for lipid check - lipitor and zetia   Is still having pain from prior OM in R ear- and it radiates into her jaw  No redness or swelling or drainage and no fever A bit congested  Throat- post nasal drip  ? If hearing is the same    Patient Active Problem List  Diagnosis  . DIABETES MELLITUS, TYPE II, CONTROLLED  . UNSPECIFIED VITAMIN D DEFICIENCY  . HYPERLIPIDEMIA  . OBESITY  . COMMON MIGRAINE  . HYPERTENSION  . WEIGHT LOSS-ABNORMAL  . Nonspecific (abnormal) findings on radiological and other examination of body structure  . PLANTAR FASCIITIS  . Fatigue  . Elevated transaminase level  . Anxiety  . Paranoia  . Yeast vaginitis  . Chest pressure  . Abnormal EKG  . Left shoulder pain  . Neck pain on left side  . Otitis media of right ear   Past Medical History  Diagnosis Date  . Hypertension   . Hyperlipidemia   .  Diabetes mellitus     type II controlled  . Palpitations   . Obesity   . Migraine     common  . Vertigo   . Plantar fasciitis   . S/P tonsillectomy    Past Surgical History  Procedure Date  . Bladder surgery 1990    bladder tack after 2nd delivery   History  Substance Use Topics  . Smoking status: Never Smoker   . Smokeless tobacco: Never Used  . Alcohol Use: No   Family History  Problem Relation Age of Onset  . Arthritis Mother     RA  . Hypertension Mother   . Drug abuse Mother   . Depression Mother   . Cancer Sister     thyroid cancer, reoccured 2008  . Depression Sister   . Drug abuse Brother   . Stroke Maternal Grandmother   . Alzheimer's disease Maternal Grandmother   . Depression Sister   . Depression Sister    Allergies  Allergen Reactions  . Shellfish Allergy    Current Outpatient Prescriptions on File Prior to Visit  Medication Sig Dispense Refill  . ALPRAZolam (XANAX) 0.25 MG tablet TAKE ONE  TABLET BY MOUTH THREE TIMES DAILY AS NEEDED FOR ANXIETY  30 tablet  2  . aspirin 81 MG tablet Take 81 mg by mouth daily.        Marland Kitchen atorvastatin (LIPITOR) 20 MG tablet Take 1 tablet (20 mg total) by mouth daily.  30 tablet  11  . cetirizine (ZYRTEC) 10 MG tablet TAKE ONE TABLET BY MOUTH EVERY DAY  30 tablet  10  . citalopram (CELEXA) 10 MG tablet Take 1 tablet (10 mg total) by mouth daily.  30 tablet  11  . eletriptan (RELPAX) 40 MG tablet One tablet by mouth at onset of headache. May repeat in 2 hours if headache persists or recurs. 1 tablet by mouth at onset of headache, repeat in 2 hours if not resolved.  10 tablet  5  . glimepiride (AMARYL) 1 MG tablet Take 1 tablet (1 mg total) by mouth daily before breakfast.  30 tablet  6  . glucose blood test strip test once daily or as needed. DM 250.00  100 each  12  . hyoscyamine (LEVSIN SL) 0.125 MG SL tablet Place 0.125 mg under the tongue every 6 (six) hours as needed. for abdominal cramps.       . lansoprazole (PREVACID  24HR) 15 MG capsule Take 1 capsule (15 mg total) by mouth daily.  90 capsule  3  . lisinopril (PRINIVIL,ZESTRIL) 20 MG tablet TAKE ONE TABLET BY MOUTH EVERY DAY  30 tablet  11  . metFORMIN (GLUCOPHAGE-XR) 500 MG 24 hr tablet TAKE TWO TABLETS BY MOUTH IN THE EVENING  60 tablet  11  . Naproxen Sodium (ALEVE PO) Take by mouth. As directed      . ZETIA 10 MG tablet TAKE ONE TABLET BY MOUTH EVERY DAY  30 each  11  . [DISCONTINUED] ipratropium (ATROVENT) 0.03 % nasal spray Place 2 sprays into the nose 3 (three) times daily.  30 mL  2     Review of Systems Review of Systems  Constitutional: Negative for fever, appetite change, fatigue and unexpected weight change.  Eyes: Negative for pain and visual disturbance.  ENT pos for ear pain , neg for st or sinus pain  Respiratory: Negative for cough and shortness of breath.   Cardiovascular: Negative for cp or palpitations    Gastrointestinal: Negative for nausea, diarrhea and constipation.  Genitourinary: Negative for urgency and frequency.  Skin: Negative for pallor or rash   Neurological: Negative for weakness, light-headedness, numbness and headaches.  Hematological: Negative for adenopathy. Does not bruise/bleed easily.  Psychiatric/Behavioral: Negative for dysphoric mood. The patient is not nervous/anxious.         Objective:   Physical Exam  Constitutional: She appears well-developed and well-nourished. No distress.  HENT:  Head: Normocephalic and atraumatic.  Left Ear: External ear normal.  Mouth/Throat: Oropharynx is clear and moist. No oropharyngeal exudate.       Nares are boggy R TM is scarred with what appears to be a healing rupture No effusion or erythema or drainage No sinus tenderness  Eyes: Conjunctivae normal and EOM are normal. Pupils are equal, round, and reactive to light. Right eye exhibits no discharge. Left eye exhibits no discharge.  Neck: Normal range of motion. Neck supple. No JVD present. Carotid bruit is not  present. No thyromegaly present.  Cardiovascular: Normal rate, regular rhythm, normal heart sounds and intact distal pulses.  Exam reveals no gallop.   Pulmonary/Chest: Effort normal and breath sounds normal. No respiratory distress. She has no wheezes.  Abdominal: Soft. Bowel sounds are normal. She exhibits no distension and no mass. There is no tenderness.  Musculoskeletal: She exhibits no edema.  Lymphadenopathy:    She has no cervical adenopathy.  Neurological: She is alert. She has normal reflexes. No cranial nerve deficit. She exhibits normal muscle tone. Coordination normal.  Skin: Skin is warm. No rash noted. No erythema. No pallor.  Psychiatric: She has a normal mood and affect.          Assessment & Plan:

## 2012-09-08 NOTE — Patient Instructions (Addendum)
Labs today  Keep checking sugar When able - experiment with exercise Try to control weight the best you can We will do ENT referral at check out

## 2012-09-08 NOTE — Assessment & Plan Note (Signed)
Ongoing pain  No redness but scar tissue and poss old TM rupture-also mouth pain/ tooth pain  Ref to ENT She will see dentist ? If infx is inc sugars

## 2012-09-08 NOTE — Assessment & Plan Note (Signed)
Worse lately- past 2 weeks Needs to schedule her eye exam Declines flu / pneumovax suboptimal diet/ exercise- disc this  May need to change amaryl to am  ? If ongoing OM or other infx- ref to ent

## 2012-09-13 ENCOUNTER — Other Ambulatory Visit: Payer: Self-pay | Admitting: *Deleted

## 2012-09-13 MED ORDER — GLIMEPIRIDE 1 MG PO TABS: 2.0000 mg | ORAL_TABLET | Freq: Every day | ORAL | Status: DC

## 2012-09-13 NOTE — Addendum Note (Signed)
Addended by: Shon Millet on: 09/13/2012 01:57 PM   Modules accepted: Orders

## 2012-09-27 ENCOUNTER — Encounter: Payer: Self-pay | Admitting: Family Medicine

## 2012-09-27 ENCOUNTER — Ambulatory Visit (INDEPENDENT_AMBULATORY_CARE_PROVIDER_SITE_OTHER): Payer: BC Managed Care – PPO | Admitting: Family Medicine

## 2012-09-27 VITALS — BP 118/64 | HR 86 | Temp 98.3°F | Ht 63.0 in | Wt 210.8 lb

## 2012-09-27 DIAGNOSIS — E119 Type 2 diabetes mellitus without complications: Secondary | ICD-10-CM

## 2012-09-27 MED ORDER — GLIMEPIRIDE 4 MG PO TABS: 4.0000 mg | ORAL_TABLET | Freq: Every day | ORAL | Status: DC

## 2012-09-27 NOTE — Progress Notes (Signed)
Subjective:    Patient ID: Stacy Monroe, female    DOB: Feb 14, 1966, 46 y.o.   MRN: [id number]  HPI  Here for f/u of DM Last visit - sugars were much higher due to eating changes and a recent ear infection  Diabetes Home sugar results - are getting a little better  Pre meal sugars are starting to come below 200s After eating is running in mid 200s for the most part  Is riding her exercise bike 20 minutes each night  DM diet - doing better- eating more salads  Exercise - is better overall  Symptoms A1C last  Lab Results  Component Value Date   HGBA1C 7.9* 09/08/2012  this was up from 6 the time before  We did increase her amaryl to 2 mg   No problems with medications  Renal protection-on ace  Last eye exam a year ago- has appt at the end of the month  Sees dentist soon for a bad tooth-- sees dentist in the am  Unsure if that was rel to ear pain  OM is better - tooth is still a problem   She is obese    Patient Active Problem List  Diagnosis  . DIABETES MELLITUS, TYPE II, CONTROLLED  . UNSPECIFIED VITAMIN D DEFICIENCY  . HYPERLIPIDEMIA  . OBESITY  . COMMON MIGRAINE  . HYPERTENSION  . WEIGHT LOSS-ABNORMAL  . Nonspecific (abnormal) findings on radiological and other examination of body structure  . PLANTAR FASCIITIS  . Fatigue  . Elevated transaminase level  . Anxiety  . Paranoia  . Yeast vaginitis  . Chest pressure  . Abnormal EKG  . Left shoulder pain  . Neck pain on left side  . Otitis media of right ear   Past Medical History  Diagnosis Date  . Hypertension   . Hyperlipidemia   . Diabetes mellitus     type II controlled  . Palpitations   . Obesity   . Migraine     common  . Vertigo   . Plantar fasciitis   . S/P tonsillectomy    Past Surgical History  Procedure Date  . Bladder surgery 1990    bladder tack after 2nd delivery   History  Substance Use Topics  . Smoking status: Never Smoker   . Smokeless tobacco: Never Used  . Alcohol Use: No     Family History  Problem Relation Age of Onset  . Arthritis Mother     RA  . Hypertension Mother   . Drug abuse Mother   . Depression Mother   . Cancer Sister     thyroid cancer, reoccured 2008  . Depression Sister   . Drug abuse Brother   . Stroke Maternal Grandmother   . Alzheimer's disease Maternal Grandmother   . Depression Sister   . Depression Sister    Allergies  Allergen Reactions  . Shellfish Allergy    Current Outpatient Prescriptions on File Prior to Visit  Medication Sig Dispense Refill  . ALPRAZolam (XANAX) 0.25 MG tablet TAKE ONE TABLET BY MOUTH THREE TIMES DAILY AS NEEDED FOR ANXIETY  30 tablet  2  . aspirin 81 MG tablet Take 81 mg by mouth daily.        Marland Kitchen atorvastatin (LIPITOR) 20 MG tablet Take 1 tablet (20 mg total) by mouth daily.  30 tablet  11  . cetirizine (ZYRTEC) 10 MG tablet TAKE ONE TABLET BY MOUTH EVERY DAY  30 tablet  10  . citalopram (CELEXA) 10 MG tablet Take  1 tablet (10 mg total) by mouth daily.  30 tablet  11  . eletriptan (RELPAX) 40 MG tablet One tablet by mouth at onset of headache. May repeat in 2 hours if headache persists or recurs. 1 tablet by mouth at onset of headache, repeat in 2 hours if not resolved.  10 tablet  5  . glimepiride (AMARYL) 1 MG tablet Take 2 tablets (2 mg total) by mouth daily before breakfast.  60 tablet  0  . glucose blood test strip test once daily or as needed. DM 250.00  100 each  12  . hyoscyamine (LEVSIN SL) 0.125 MG SL tablet Place 0.125 mg under the tongue every 6 (six) hours as needed. for abdominal cramps.       . lansoprazole (PREVACID 24HR) 15 MG capsule Take 1 capsule (15 mg total) by mouth daily.  90 capsule  3  . lisinopril (PRINIVIL,ZESTRIL) 20 MG tablet TAKE ONE TABLET BY MOUTH EVERY DAY  30 tablet  11  . metFORMIN (GLUCOPHAGE-XR) 500 MG 24 hr tablet TAKE TWO TABLETS BY MOUTH IN THE EVENING  60 tablet  11  . Naproxen Sodium (ALEVE PO) Take by mouth. As directed      . ZETIA 10 MG tablet TAKE ONE TABLET  BY MOUTH EVERY DAY  30 each  11  . [DISCONTINUED] ipratropium (ATROVENT) 0.03 % nasal spray Place 2 sprays into the nose 3 (three) times daily.  30 mL  2    Review of Systems Review of Systems  Constitutional: Negative for fever, appetite change, fatigue and unexpected weight change.  Eyes: Negative for pain and visual disturbance.  Respiratory: Negative for cough and shortness of breath.   Cardiovascular: Negative for cp or palpitations    Gastrointestinal: Negative for nausea, diarrhea and constipation.  Genitourinary: Negative for urgency and frequency. neg for excessive thirst  Skin: Negative for pallor or rash   Neurological: Negative for weakness, light-headedness, numbness and headaches.  Hematological: Negative for adenopathy. Does not bruise/bleed easily.  Psychiatric/Behavioral: Negative for dysphoric mood. The patient is not nervous/anxious.         Objective:   Physical Exam  Constitutional: She appears well-developed and well-nourished. No distress.       obese and well appearing   HENT:  Head: Normocephalic and atraumatic.  Mouth/Throat: Oropharynx is clear and moist.  Eyes: Conjunctivae normal and EOM are normal. Pupils are equal, round, and reactive to light. No scleral icterus.  Neck: Normal range of motion. Neck supple. No thyromegaly present.  Cardiovascular: Normal rate, regular rhythm and intact distal pulses.  Exam reveals no gallop.   Pulmonary/Chest: Effort normal and breath sounds normal. No respiratory distress. She has no wheezes.  Abdominal: Soft. Bowel sounds are normal. She exhibits no distension and no mass. There is no tenderness.  Musculoskeletal: Normal range of motion. She exhibits no edema.  Lymphadenopathy:    She has no cervical adenopathy.  Neurological: She is alert. She has normal reflexes. No cranial nerve deficit. She exhibits normal muscle tone. Coordination normal.  Skin: Skin is dry. No erythema. No pallor.  Psychiatric: She has a  normal mood and affect.          Assessment & Plan:

## 2012-09-27 NOTE — Patient Instructions (Addendum)
Increase your amaryl to 4 mg once per day (use what you have left and then start the 4 mg pills I sent to the pharmacy)  Continue working on diet and exercise Schedule non fasting lab and then follow up in 3 months  See your dentist as planned

## 2012-09-27 NOTE — Assessment & Plan Note (Signed)
Very slowly imp with inc amaryl Will inc this further to 4 mg once daily  Pt will update if low sugar or other side eff Enc to keep up better diet and exercise habits Poss infected tooth- hope treating this will help as well  F/u 3 mo after labs opthy will be end of the month

## 2012-10-18 ENCOUNTER — Other Ambulatory Visit: Payer: Self-pay | Admitting: Family Medicine

## 2012-11-13 ENCOUNTER — Other Ambulatory Visit: Payer: Self-pay

## 2012-11-18 ENCOUNTER — Other Ambulatory Visit: Payer: Self-pay | Admitting: Family Medicine

## 2012-12-15 ENCOUNTER — Ambulatory Visit (INDEPENDENT_AMBULATORY_CARE_PROVIDER_SITE_OTHER): Payer: BC Managed Care – PPO | Admitting: Family Medicine

## 2012-12-15 ENCOUNTER — Encounter: Payer: Self-pay | Admitting: Family Medicine

## 2012-12-15 VITALS — BP 114/78 | HR 116 | Temp 99.4°F | Ht 63.0 in

## 2012-12-15 DIAGNOSIS — R059 Cough, unspecified: Secondary | ICD-10-CM | POA: Insufficient documentation

## 2012-12-15 DIAGNOSIS — R05 Cough: Secondary | ICD-10-CM

## 2012-12-15 DIAGNOSIS — J111 Influenza due to unidentified influenza virus with other respiratory manifestations: Secondary | ICD-10-CM

## 2012-12-15 DIAGNOSIS — J029 Acute pharyngitis, unspecified: Secondary | ICD-10-CM | POA: Insufficient documentation

## 2012-12-15 LAB — POCT INFLUENZA A/B: Influenza B, POC: POSITIVE

## 2012-12-15 LAB — POCT RAPID STREP A (OFFICE): Rapid Strep A Screen: NEGATIVE

## 2012-12-15 MED ORDER — GUAIFENESIN-CODEINE 100-10 MG/5ML PO SYRP: 5.0000 mL | ORAL_SOLUTION | Freq: Four times a day (QID) | ORAL | Status: DC | PRN

## 2012-12-15 MED ORDER — AMOXICILLIN 500 MG PO CAPS: 500.0000 mg | ORAL_CAPSULE | Freq: Three times a day (TID) | ORAL | Status: DC

## 2012-12-15 NOTE — Assessment & Plan Note (Signed)
Rapid strep neg but with erythema in throat and close contact to strep Covered with amox and cx sent  Will update

## 2012-12-15 NOTE — Patient Instructions (Addendum)
I sent out a throat culture and we will inform you when that comes back Also your flu test is positive today In the meantime take the amoxicillin as directed  Use the codeine cough syrup as needed for cough and try to get rest and drink fluids  I sent amoxicillin to the pharmacy, here is a paper px for the cough medicine

## 2012-12-15 NOTE — Progress Notes (Signed)
Subjective:    Patient ID: Stacy Monroe, female    DOB: [birth year], 47 y.o.   MRN: [id number]  HPI Here for uri symptoms C/o sore throat started on Sunday  Body aches (temp is 99.4 today) Cough - harsh and dry (occ a little mucous - with a bit of color to it) Chest is sore to cough  Joints and muscles ache and hurt - especially lower back  Coughed so hard she vomited  Head hurts Ears hurts  A little bit of nasal congestion   Rapid strep test is negative today  Flu shot status -no flu shot this year   No flu contacts  Son has strep    Patient Active Problem List  Diagnosis  . DIABETES MELLITUS, TYPE II, CONTROLLED  . UNSPECIFIED VITAMIN D DEFICIENCY  . HYPERLIPIDEMIA  . OBESITY  . COMMON MIGRAINE  . HYPERTENSION  . WEIGHT LOSS-ABNORMAL  . Nonspecific (abnormal) findings on radiological and other examination of body structure  . PLANTAR FASCIITIS  . Fatigue  . Elevated transaminase level  . Anxiety  . Paranoia  . Yeast vaginitis  . Chest pressure  . Abnormal EKG  . Left shoulder pain  . Neck pain on left side   Past Medical History  Diagnosis Date  . Hypertension   . Hyperlipidemia   . Diabetes mellitus     type II controlled  . Palpitations   . Obesity   . Migraine     common  . Vertigo   . Plantar fasciitis   . S/P tonsillectomy    Past Surgical History  Procedure Laterality Date  . Bladder surgery  1990    bladder tack after 2nd delivery   History  Substance Use Topics  . Smoking status: Never Smoker   . Smokeless tobacco: Never Used  . Alcohol Use: No   Family History  Problem Relation Age of Onset  . Arthritis Mother     RA  . Hypertension Mother   . Drug abuse Mother   . Depression Mother   . Cancer Sister     thyroid cancer, reoccured 2008  . Depression Sister   . Drug abuse Brother   . Stroke Maternal Grandmother   . Alzheimer's disease Maternal Grandmother   . Depression Sister   . Depression Sister    Allergies   Allergen Reactions  . Shellfish Allergy    Current Outpatient Prescriptions on File Prior to Visit  Medication Sig Dispense Refill  . ALPRAZolam (XANAX) 0.25 MG tablet TAKE ONE TABLET BY MOUTH THREE TIMES DAILY AS NEEDED FOR ANXIETY  30 tablet  2  . aspirin 81 MG tablet Take 81 mg by mouth daily.        . cetirizine (ZYRTEC) 10 MG tablet TAKE ONE TABLET BY MOUTH EVERY DAY  30 tablet  2  . citalopram (CELEXA) 10 MG tablet TAKE ONE TABLET BY MOUTH EVERY DAY  30 tablet  3  . eletriptan (RELPAX) 40 MG tablet One tablet by mouth at onset of headache. May repeat in 2 hours if headache persists or recurs. 1 tablet by mouth at onset of headache, repeat in 2 hours if not resolved.  10 tablet  5  . glimepiride (AMARYL) 4 MG tablet Take 1 tablet (4 mg total) by mouth daily before breakfast.  30 tablet  3  . glucose blood test strip test once daily or as needed. DM 250.00  100 each  12  . hyoscyamine (LEVSIN SL) 0.125 MG SL tablet Place  0.125 mg under the tongue every 6 (six) hours as needed. for abdominal cramps.       . lansoprazole (PREVACID 24HR) 15 MG capsule Take 1 capsule (15 mg total) by mouth daily.  90 capsule  3  . lisinopril (PRINIVIL,ZESTRIL) 20 MG tablet TAKE ONE TABLET BY MOUTH EVERY DAY  30 tablet  6  . metFORMIN (GLUCOPHAGE-XR) 500 MG 24 hr tablet TAKE TWO TABLETS BY MOUTH IN THE EVENING  60 tablet  6  . Naproxen Sodium (ALEVE PO) Take by mouth. As directed      . ZETIA 10 MG tablet TAKE ONE TABLET BY MOUTH EVERY DAY  30 tablet  6  . [DISCONTINUED] ipratropium (ATROVENT) 0.03 % nasal spray Place 2 sprays into the nose 3 (three) times daily.  30 mL  2   No current facility-administered medications on file prior to visit.    Review of Systems Review of Systems  Constitutional: Negative for  unexpected weight change. pos for malaise and fever ENT pos for cong/ rhinorrhea/ neg for facial pain  Eyes: Negative for pain and visual disturbance.  Respiratory: Negative for wheeze and  shortness of breath.   Cardiovascular: Negative for cp or palpitations    Gastrointestinal: Negative for nausea, diarrhea and constipation.  Genitourinary: Negative for urgency and frequency.  Skin: Negative for pallor or rash   Neurological: Negative for weakness, light-headedness, numbness and headaches.  Hematological: Negative for adenopathy. Does not bruise/bleed easily.  Psychiatric/Behavioral: Negative for dysphoric mood. The patient is not nervous/anxious.         Objective:   Physical Exam  Constitutional: She appears well-developed and well-nourished. No distress.  HENT:  Head: Normocephalic and atraumatic.  Right Ear: External ear normal.  Mouth/Throat: No oropharyngeal exudate.  Nares are injected and congested   No sinus tenderness  Throat - marked erythema without swelling/ ulcers or exudate   Eyes: Conjunctivae and EOM are normal. Pupils are equal, round, and reactive to light. Right eye exhibits no discharge. Left eye exhibits no discharge. No scleral icterus.  Neck: Neck supple.  Few shotty anterior cervical LN  Cardiovascular: Regular rhythm and normal heart sounds.   Pulmonary/Chest: Effort normal and breath sounds normal. No respiratory distress. She has no wheezes. She has no rales. She exhibits tenderness.  Abdominal: Soft. Bowel sounds are normal. She exhibits no distension and no mass. There is no tenderness.  Musculoskeletal: She exhibits no edema.  No acute joint changes   Lymphadenopathy:    She has cervical adenopathy.  Neurological: She is alert. She has normal reflexes. No cranial nerve deficit. She exhibits normal muscle tone. Coordination normal.  Skin: Skin is warm and dry. No rash noted. No erythema. No pallor.  Psychiatric: She has a normal mood and affect.  Pt is fatigued           Assessment & Plan:

## 2012-12-15 NOTE — Assessment & Plan Note (Signed)
Rapid flu pos  Given robitussin ac Disc symptomatic care - see instructions on AVS - rest and fluids  Update if not starting to improve in a week or if worsening

## 2012-12-17 LAB — CULTURE, GROUP A STREP: Organism ID, Bacteria: NORMAL

## 2012-12-20 ENCOUNTER — Other Ambulatory Visit (INDEPENDENT_AMBULATORY_CARE_PROVIDER_SITE_OTHER): Payer: BC Managed Care – PPO

## 2012-12-20 ENCOUNTER — Other Ambulatory Visit: Payer: Self-pay | Admitting: Family Medicine

## 2012-12-20 ENCOUNTER — Telehealth: Payer: Self-pay | Admitting: Family Medicine

## 2012-12-20 DIAGNOSIS — I10 Essential (primary) hypertension: Secondary | ICD-10-CM

## 2012-12-20 DIAGNOSIS — E119 Type 2 diabetes mellitus without complications: Secondary | ICD-10-CM

## 2012-12-20 DIAGNOSIS — R7401 Elevation of levels of liver transaminase levels: Secondary | ICD-10-CM

## 2012-12-20 DIAGNOSIS — E785 Hyperlipidemia, unspecified: Secondary | ICD-10-CM

## 2012-12-20 LAB — COMPREHENSIVE METABOLIC PANEL
ALT: 57 U/L — ABNORMAL HIGH (ref 0–35)
BUN: 16 mg/dL (ref 6–23)
CO2: 27 mEq/L (ref 19–32)
Calcium: 9 mg/dL (ref 8.4–10.5)
Chloride: 98 mEq/L (ref 96–112)
Creatinine, Ser: 0.7 mg/dL (ref 0.4–1.2)
GFR: 93.02 mL/min (ref >60.00)
Glucose, Bld: 240 mg/dL — ABNORMAL HIGH (ref 70–99)

## 2012-12-20 LAB — HEMOGLOBIN A1C: Hgb A1c MFr Bld: 8.9 % — ABNORMAL HIGH (ref 4.6–6.5)

## 2012-12-20 NOTE — Telephone Encounter (Signed)
Message copied by Judy Pimple on Mon Dec 20, 2012  8:01 AM ------      Message from: Baldomero Lamy      Created: Fri Dec 10, 2012 10:25 AM      Regarding: f/u labs scheduled 12/20/12 (Mon)       Please order  future f/u non fasting labs for pt's upcomming lab appt.      Thanks      Tasha             ------

## 2012-12-27 ENCOUNTER — Ambulatory Visit: Payer: BC Managed Care – PPO | Admitting: Family Medicine

## 2012-12-29 ENCOUNTER — Ambulatory Visit (INDEPENDENT_AMBULATORY_CARE_PROVIDER_SITE_OTHER): Payer: BC Managed Care – PPO | Admitting: Family Medicine

## 2012-12-29 ENCOUNTER — Encounter: Payer: Self-pay | Admitting: Family Medicine

## 2012-12-29 VITALS — BP 122/62 | HR 84 | Temp 98.1°F | Ht 63.0 in | Wt 206.2 lb

## 2012-12-29 DIAGNOSIS — R05 Cough: Secondary | ICD-10-CM

## 2012-12-29 DIAGNOSIS — E119 Type 2 diabetes mellitus without complications: Secondary | ICD-10-CM

## 2012-12-29 DIAGNOSIS — R059 Cough, unspecified: Secondary | ICD-10-CM

## 2012-12-29 MED ORDER — INSULIN GLARGINE 100 UNIT/ML ~~LOC~~ SOLN: 50.0000 [IU] | Freq: Every day | SUBCUTANEOUS | Status: DC

## 2012-12-29 NOTE — Patient Instructions (Addendum)
For cough I want you to hold your lisinopril for 2 weeks and then call and let me know if cough is better  lantus - start with 20 units injected in the evening , and increase by 10 units per week - for 4 weeks (stopping where you are if sugars get to 120 fasting and 140 after meal)  Week one 20 units Week two 30 units Week three 40 units Week four 50 units --- at that point let me know what your sugars are doing  Follow up in 3 months with labs prior   I sent px to the pharmacy- have them call to tell me what size needle to order

## 2012-12-29 NOTE — Assessment & Plan Note (Addendum)
Not improving on 2 orals Will start lantus- 20 u and titrate to 50 as tolerated-pt familiar with injection (will titrate up to effect starting with 10 u per week until at 50 u daily or controlled) Disc s/s hypoglycemia and imp or regular meals  Holding ace for cough- may need to change to ARB Update 1 mo with sugars  F/u 3 mo  Long disc about lifestyle change also  >25 min spent with face to face with patient, >50% counseling and/or coordinating care

## 2012-12-29 NOTE — Progress Notes (Signed)
Subjective:    Patient ID: Stacy Monroe, female    DOB: 06/18/1966, 47 y.o.   MRN: [id number]  HPI Here for f/u of DM2  Wt is down 4 lb Obese- knows she needs to loose weight - but has little time to take care of herself with full time work and school    Sugar was 240 this check --had had the flu the week before (? If that made it so high)   Chemistry      Component Value Date/Time   NA 134* 12/20/2012 0922   K 4.6 12/20/2012 0922   CL 98 12/20/2012 0922   CO2 27 12/20/2012 0922   BUN 16 12/20/2012 0922   CREATININE 0.7 12/20/2012 0922      Component Value Date/Time   CALCIUM 9.0 12/20/2012 0922   ALKPHOS 49 12/20/2012 0922   AST 42* 12/20/2012 0922   ALT 57* 12/20/2012 0922   BILITOT 0.5 12/20/2012 1478       Diabetes Home sugar results - around the 180s (down from the 220s) Am 180s usually, afternoons 2 hours pp range 180-200  DM diet - is good - sticking to diet fairly  (has not eaten as much when sick) Exercise - no time- works and goes to school - fits it in where she can  Symptoms- thirsy A1C last  Lab Results  Component Value Date   HGBA1C 8.9* 12/20/2012  this is up from 7.9  No problems with medications -increased amaryl to 4 and on metformin  Renal protection-on ace   (but she is coughing chronically now) Last eye exam - had that last mo  She does not always remember to take amaryl in am so she changed to pm   She was on victoza in the past - did not like it  (the injection hurt and it gave her GI distress)  Although the flu worsened her cough- (she is improved now)- she is still coughing (dry hacking) and has for months Non productive Tickle in throat No GERD symptoms  Patient Active Problem List  Diagnosis  . DIABETES MELLITUS, TYPE II, CONTROLLED  . UNSPECIFIED VITAMIN D DEFICIENCY  . HYPERLIPIDEMIA  . OBESITY  . COMMON MIGRAINE  . HYPERTENSION  . WEIGHT LOSS-ABNORMAL  . Nonspecific (abnormal) findings on radiological and other examination of body  structure  . PLANTAR FASCIITIS  . Fatigue  . Elevated transaminase level  . Anxiety  . Paranoia  . Yeast vaginitis  . Chest pressure  . Abnormal EKG  . Left shoulder pain  . Neck pain on left side  . Acute pharyngitis  . Cough  . Influenza with other respiratory manifestations   Past Medical History  Diagnosis Date  . Hypertension   . Hyperlipidemia   . Diabetes mellitus     type II controlled  . Palpitations   . Obesity   . Migraine     common  . Vertigo   . Plantar fasciitis   . S/P tonsillectomy    Past Surgical History  Procedure Laterality Date  . Bladder surgery  1990    bladder tack after 2nd delivery   History  Substance Use Topics  . Smoking status: Never Smoker   . Smokeless tobacco: Never Used  . Alcohol Use: No   Family History  Problem Relation Age of Onset  . Arthritis Mother     RA  . Hypertension Mother   . Drug abuse Mother   . Depression Mother   . Cancer Sister  thyroid cancer, reoccured 2008  . Depression Sister   . Drug abuse Brother   . Stroke Maternal Grandmother   . Alzheimer's disease Maternal Grandmother   . Depression Sister   . Depression Sister    Allergies  Allergen Reactions  . Shellfish Allergy    Current Outpatient Prescriptions on File Prior to Visit  Medication Sig Dispense Refill  . ALPRAZolam (XANAX) 0.25 MG tablet TAKE ONE TABLET BY MOUTH THREE TIMES DAILY AS NEEDED FOR ANXIETY  30 tablet  2  . aspirin 81 MG tablet Take 81 mg by mouth daily.        Marland Kitchen atorvastatin (LIPITOR) 20 MG tablet TAKE ONE TABLET BY MOUTH EVERY DAY  30 tablet  0  . cetirizine (ZYRTEC) 10 MG tablet TAKE ONE TABLET BY MOUTH EVERY DAY  30 tablet  2  . citalopram (CELEXA) 10 MG tablet TAKE ONE TABLET BY MOUTH EVERY DAY  30 tablet  3  . eletriptan (RELPAX) 40 MG tablet One tablet by mouth at onset of headache. May repeat in 2 hours if headache persists or recurs. 1 tablet by mouth at onset of headache, repeat in 2 hours if not resolved.  10  tablet  5  . glimepiride (AMARYL) 4 MG tablet Take 1 tablet (4 mg total) by mouth daily before breakfast.  30 tablet  3  . glucose blood test strip test once daily or as needed. DM 250.00  100 each  12  . hyoscyamine (LEVSIN SL) 0.125 MG SL tablet Place 0.125 mg under the tongue every 6 (six) hours as needed. for abdominal cramps.       . lansoprazole (PREVACID 24HR) 15 MG capsule Take 1 capsule (15 mg total) by mouth daily.  90 capsule  3  . lisinopril (PRINIVIL,ZESTRIL) 20 MG tablet TAKE ONE TABLET BY MOUTH EVERY DAY  30 tablet  6  . metFORMIN (GLUCOPHAGE-XR) 500 MG 24 hr tablet TAKE TWO TABLETS BY MOUTH IN THE EVENING  60 tablet  6  . Naproxen Sodium (ALEVE PO) Take by mouth. As directed      . ZETIA 10 MG tablet TAKE ONE TABLET BY MOUTH EVERY DAY  30 tablet  6  . [DISCONTINUED] ipratropium (ATROVENT) 0.03 % nasal spray Place 2 sprays into the nose 3 (three) times daily.  30 mL  2   No current facility-administered medications on file prior to visit.     Review of Systems Review of Systems  Constitutional: Negative for fever, appetite change,  and unexpected weight change. pos for significant fatigue Eyes: Negative for pain and visual disturbance.  Respiratory: Negative for wheeze  and shortness of breath.   Cardiovascular: Negative for cp or palpitations    Gastrointestinal: Negative for nausea, diarrhea and constipation.  Genitourinary: Negative for urgency and frequency.  Skin: Negative for pallor or rash   Neurological: Negative for weakness, light-headedness, numbness and headaches.  Hematological: Negative for adenopathy. Does not bruise/bleed easily.  Psychiatric/Behavioral: Negative for dysphoric mood. The patient is not nervous/anxious.  pos for stressors        Objective:   Physical Exam  Constitutional: She appears well-developed and well-nourished. No distress.  obese and well appearing   HENT:  Head: Normocephalic and atraumatic.  Mouth/Throat: Oropharynx is clear  and moist.  Eyes: Conjunctivae and EOM are normal. Pupils are equal, round, and reactive to light. Right eye exhibits no discharge. Left eye exhibits no discharge. No scleral icterus.  Neck: Normal range of motion. Neck supple.  Cardiovascular:  Normal rate, regular rhythm, normal heart sounds and intact distal pulses.  Exam reveals no gallop.   Pulmonary/Chest: Effort normal and breath sounds normal. No respiratory distress. She has no wheezes. She has no rales. She exhibits no tenderness.  No crackles   Abdominal: Soft. Bowel sounds are normal. She exhibits no distension and no mass. There is no tenderness.  Musculoskeletal: She exhibits no edema and no tenderness.  Lymphadenopathy:    She has no cervical adenopathy.  Neurological: She is alert. She has normal reflexes. No cranial nerve deficit. She exhibits normal muscle tone. Coordination normal.  Skin: Skin is warm and dry. No rash noted. No erythema. No pallor.  Psychiatric: She has a normal mood and affect.          Assessment & Plan:

## 2012-12-29 NOTE — Assessment & Plan Note (Signed)
?   Due to ace Hold it 2 wk and update  If better -may try arb

## 2013-01-15 ENCOUNTER — Other Ambulatory Visit: Payer: Self-pay | Admitting: Family Medicine

## 2013-01-17 ENCOUNTER — Other Ambulatory Visit: Payer: Self-pay

## 2013-01-17 ENCOUNTER — Encounter: Payer: Self-pay | Admitting: *Deleted

## 2013-01-17 MED ORDER — GLUCOSE BLOOD VI STRP: ORAL_STRIP | Status: DC

## 2013-01-17 NOTE — Telephone Encounter (Signed)
Pt request refill glucose test strips to [facility] [address]. Advised done.

## 2013-01-31 ENCOUNTER — Encounter: Payer: Self-pay | Admitting: Family Medicine

## 2013-02-02 ENCOUNTER — Other Ambulatory Visit: Payer: Self-pay | Admitting: Family Medicine

## 2013-02-28 ENCOUNTER — Encounter: Payer: Self-pay | Admitting: Radiology

## 2013-03-01 ENCOUNTER — Ambulatory Visit (INDEPENDENT_AMBULATORY_CARE_PROVIDER_SITE_OTHER): Payer: BC Managed Care – PPO | Admitting: Family Medicine

## 2013-03-01 ENCOUNTER — Encounter: Payer: Self-pay | Admitting: Family Medicine

## 2013-03-01 VITALS — BP 140/80 | HR 87 | Temp 98.2°F | Ht 63.0 in | Wt 216.8 lb

## 2013-03-01 DIAGNOSIS — R5381 Other malaise: Secondary | ICD-10-CM

## 2013-03-01 DIAGNOSIS — R5383 Other fatigue: Secondary | ICD-10-CM

## 2013-03-01 DIAGNOSIS — G43009 Migraine without aura, not intractable, without status migrainosus: Secondary | ICD-10-CM

## 2013-03-01 DIAGNOSIS — J019 Acute sinusitis, unspecified: Secondary | ICD-10-CM

## 2013-03-01 MED ORDER — AMOXICILLIN-POT CLAVULANATE 875-125 MG PO TABS: 1.0000 | ORAL_TABLET | Freq: Two times a day (BID) | ORAL | Status: DC

## 2013-03-01 MED ORDER — PROMETHAZINE HCL 25 MG/ML IJ SOLN
25.0000 mg | Freq: Once | INTRAMUSCULAR | Status: AC
  Administered 2013-03-01: 25 mg via INTRAMUSCULAR

## 2013-03-01 MED ORDER — KETOROLAC TROMETHAMINE 30 MG/ML IJ SOLN
30.0000 mg | Freq: Once | INTRAMUSCULAR | Status: AC
  Administered 2013-03-01: 30 mg via INTRAVENOUS

## 2013-03-01 MED ORDER — METOPROLOL SUCCINATE ER 25 MG PO TB24: 25.0000 mg | ORAL_TABLET | Freq: Every day | ORAL | Status: DC

## 2013-03-01 NOTE — Assessment & Plan Note (Signed)
Worsened lately- daily- by sinusitis  toradol 30 mg and phernegan 25 today  Also start toprol xl 25 mg daily-disc poss side eff  F/u July as planned Rev lifestyle changes

## 2013-03-01 NOTE — Patient Instructions (Addendum)
For headache today- toradol and phenergan injections Drink lots of water  Get some rest  Take the augmentin as directed for sinus infection Start the toprol 25 mg once daily - for headache prevention  Follow up as planned

## 2013-03-01 NOTE — Progress Notes (Signed)
Subjective:    Patient ID: Stacy Monroe, female    DOB: Mar 15, 1966, 47 y.o.   MRN: [id number]  HPI Headaches started the first part of May - now every single day  At first thought it may be a sinus headache and then moved to the back of her head and her neck  The last few days - worse  3 migraines in the last 4 days  On a pain scale as bad occ as 10/10  Pos for nausea/ neg for vomiting Neck pain is at times intense also   Aleve and advil don't work - used to work before this all started  [employer] 2 excedrin migraine this am - calmed it a bit  One day relpax did not help , another day it did help  Usually pain starts and stays on the R side - and frontal  Throbbing  Does hurt in eyes/ears and cheekbones  Pressing on face hurts and lying down hurts too  Some green nasal drainage  No fever  Felt hot during migraine one time  No cough   Pos for photophobia and phonophobia  Worse with exertion or valsalva or to bend over  Gets light headed occ  Some blurred vision (just had her eyes checked and has new glasses since feb)   Has had migraines - within the last 10 years -not as a kid    Patient Active Problem List   Diagnosis Date Noted  . Cough 12/15/2012  . Left shoulder pain 02/25/2012  . Neck pain on left side 02/25/2012  . Abnormal EKG 01/08/2012  . Chest pressure 12/04/2011  . Yeast vaginitis 07/25/2011  . Anxiety 06/24/2011  . Paranoia 06/24/2011  . Fatigue 01/21/2011  . Elevated transaminase level 01/21/2011  . PLANTAR FASCIITIS 10/02/2010  . WEIGHT LOSS-ABNORMAL 05/07/2010  . Nonspecific (abnormal) findings on radiological and other examination of body structure 05/07/2010  . UNSPECIFIED VITAMIN D DEFICIENCY 04/26/2010  . HYPERTENSION 02/15/2009  . Type II or unspecified type diabetes mellitus without mention of complication, uncontrolled 09/07/2008  . HYPERLIPIDEMIA 09/07/2008  . OBESITY 04/24/2008  . COMMON MIGRAINE 04/18/2008   Past Medical History   Diagnosis Date  . Hypertension   . Hyperlipidemia   . Diabetes mellitus     type II controlled  . Palpitations   . Obesity   . Migraine     common  . Vertigo   . Plantar fasciitis   . S/P tonsillectomy    Past Surgical History  Procedure Laterality Date  . Bladder surgery  1990    bladder tack after 2nd delivery   History  Substance Use Topics  . Smoking status: Never Smoker   . Smokeless tobacco: Never Used  . Alcohol Use: No   Family History  Problem Relation Age of Onset  . Arthritis Mother     RA  . Hypertension Mother   . Drug abuse Mother   . Depression Mother   . Cancer Sister     thyroid cancer, reoccured 2008  . Depression Sister   . Drug abuse Brother   . Stroke Maternal Grandmother   . Alzheimer's disease Maternal Grandmother   . Depression Sister   . Depression Sister    Allergies  Allergen Reactions  . Shellfish Allergy    Current Outpatient Prescriptions on File Prior to Visit  Medication Sig Dispense Refill  . ALPRAZolam (XANAX) 0.25 MG tablet TAKE ONE TABLET BY MOUTH THREE TIMES DAILY AS NEEDED FOR ANXIETY  30 tablet  2  .  aspirin 81 MG tablet Take 81 mg by mouth daily.        Marland Kitchen atorvastatin (LIPITOR) 20 MG tablet TAKE ONE TABLET BY MOUTH ONCE DAILY  30 tablet  2  . cetirizine (ZYRTEC) 10 MG tablet TAKE ONE TABLET BY MOUTH EVERY DAY  30 tablet  2  . citalopram (CELEXA) 10 MG tablet TAKE ONE TABLET BY MOUTH EVERY DAY  30 tablet  3  . eletriptan (RELPAX) 40 MG tablet One tablet by mouth at onset of headache. May repeat in 2 hours if headache persists or recurs. 1 tablet by mouth at onset of headache, repeat in 2 hours if not resolved.  10 tablet  5  . glimepiride (AMARYL) 4 MG tablet Take 1 tablet (4 mg total) by mouth daily before breakfast.  30 tablet  3  . glucose blood test strip test 3-4 times daily or as needed. DM 250.00  100 each  12  . hyoscyamine (LEVSIN SL) 0.125 MG SL tablet Place 0.125 mg under the tongue every 6 (six) hours as  needed. for abdominal cramps.       . insulin glargine (LANTUS SOLOSTAR) 100 UNIT/ML injection Inject 0.5 mLs (50 Units total) into the skin at bedtime.  5 pen  5  . lansoprazole (PREVACID 24HR) 15 MG capsule Take 1 capsule (15 mg total) by mouth daily.  90 capsule  3  . lisinopril (PRINIVIL,ZESTRIL) 20 MG tablet TAKE ONE TABLET BY MOUTH EVERY DAY  30 tablet  6  . metFORMIN (GLUCOPHAGE-XR) 500 MG 24 hr tablet TAKE TWO TABLETS BY MOUTH IN THE EVENING  60 tablet  6  . Naproxen Sodium (ALEVE PO) Take by mouth. As directed      . ZETIA 10 MG tablet TAKE ONE TABLET BY MOUTH EVERY DAY  30 tablet  6  . [DISCONTINUED] ipratropium (ATROVENT) 0.03 % nasal spray Place 2 sprays into the nose 3 (three) times daily.  30 mL  2   No current facility-administered medications on file prior to visit.     Review of Systems Review of Systems  Constitutional: Negative for fever, appetite change, and unexpected weight change.pos for fatigue  ENt pos for some nasal congestion and sinus pain   Eyes: Negative for pain and visual disturbance.  Respiratory: Negative for cough and shortness of breath.   Cardiovascular: Negative for cp or palpitations    Gastrointestinal: Negative for nausea, diarrhea and constipation.  Genitourinary: Negative for urgency and frequency.  Skin: Negative for pallor or rash   Neurological: Negative for weakness, light-headedness, numbness and pos for headaches  Hematological: Negative for adenopathy. Does not bruise/bleed easily.  Psychiatric/Behavioral: Negative for dysphoric mood. The patient is not nervous/anxious.         Objective:   Physical Exam  Constitutional: She is oriented to person, place, and time. She appears well-developed and well-nourished. No distress.  HENT:  Head: Normocephalic and atraumatic.  Right Ear: External ear normal.  Left Ear: External ear normal.  Mouth/Throat: Oropharynx is clear and moist. No oropharyngeal exudate.  Nares are injected and mildly  congested  R ethmoid and L frontal sinus tenderness No facial swelling   Eyes: Conjunctivae and EOM are normal. Pupils are equal, round, and reactive to light. Right eye exhibits no discharge. Left eye exhibits no discharge. No scleral icterus.  Neck: Normal range of motion. Neck supple. No JVD present. Carotid bruit is not present. No thyromegaly present.  Cardiovascular: Normal rate, regular rhythm, normal heart sounds and intact distal  pulses.   No murmur heard. Pulmonary/Chest: Effort normal and breath sounds normal.  Musculoskeletal: She exhibits no edema.  Lymphadenopathy:    She has no cervical adenopathy.  Neurological: She is alert and oriented to person, place, and time. She has normal reflexes. She displays no atrophy and no tremor. No cranial nerve deficit or sensory deficit. She exhibits normal muscle tone. She displays a negative Romberg sign. Coordination and gait normal.  No cerebellar signs   Skin: Skin is warm and dry. No rash noted. No pallor.  Psychiatric: She has a normal mood and affect.          Assessment & Plan:

## 2013-03-01 NOTE — Assessment & Plan Note (Signed)
Worsening migraine  tx with augmentin times one week Disc symptomatic care - see instructions on AVS  toradol inj 30 mg today also

## 2013-03-01 NOTE — Assessment & Plan Note (Signed)
Added tsh to upcoming lab in end of the mo

## 2013-03-12 ENCOUNTER — Encounter: Payer: Self-pay | Admitting: Family Medicine

## 2013-03-15 ENCOUNTER — Telehealth: Payer: Self-pay | Admitting: Family Medicine

## 2013-03-15 ENCOUNTER — Encounter: Payer: Self-pay | Admitting: Family Medicine

## 2013-03-15 MED ORDER — FLUCONAZOLE 150 MG PO TABS: 150.0000 mg | ORAL_TABLET | Freq: Once | ORAL | Status: DC

## 2013-03-15 NOTE — Telephone Encounter (Signed)
Diflucan for yeast infx

## 2013-03-22 ENCOUNTER — Other Ambulatory Visit: Payer: Self-pay | Admitting: Family Medicine

## 2013-03-23 ENCOUNTER — Other Ambulatory Visit (INDEPENDENT_AMBULATORY_CARE_PROVIDER_SITE_OTHER): Payer: BC Managed Care – PPO

## 2013-03-23 DIAGNOSIS — R5381 Other malaise: Secondary | ICD-10-CM

## 2013-03-23 DIAGNOSIS — R5383 Other fatigue: Secondary | ICD-10-CM

## 2013-03-23 LAB — TSH: TSH: 1.58 u[IU]/mL (ref 0.35–5.50)

## 2013-03-24 ENCOUNTER — Encounter: Payer: Self-pay | Admitting: Family Medicine

## 2013-03-30 ENCOUNTER — Ambulatory Visit (INDEPENDENT_AMBULATORY_CARE_PROVIDER_SITE_OTHER): Payer: BC Managed Care – PPO | Admitting: Family Medicine

## 2013-03-30 ENCOUNTER — Encounter: Payer: Self-pay | Admitting: Family Medicine

## 2013-03-30 VITALS — BP 116/78 | HR 64 | Temp 97.9°F | Ht 63.0 in | Wt 220.2 lb

## 2013-03-30 DIAGNOSIS — IMO0001 Reserved for inherently not codable concepts without codable children: Secondary | ICD-10-CM

## 2013-03-30 DIAGNOSIS — R200 Anesthesia of skin: Secondary | ICD-10-CM | POA: Insufficient documentation

## 2013-03-30 DIAGNOSIS — G43009 Migraine without aura, not intractable, without status migrainosus: Secondary | ICD-10-CM

## 2013-03-30 DIAGNOSIS — E669 Obesity, unspecified: Secondary | ICD-10-CM

## 2013-03-30 DIAGNOSIS — R209 Unspecified disturbances of skin sensation: Secondary | ICD-10-CM

## 2013-03-30 LAB — LIPID PANEL: VLDL: 38.2 mg/dL (ref 0.0–40.0)

## 2013-03-30 LAB — HEMOGLOBIN A1C: Hgb A1c MFr Bld: 7.1 % — ABNORMAL HIGH (ref 4.6–6.5)
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
## 2013-03-30 NOTE — Patient Instructions (Addendum)
Work towards 5 days of exercise at least 30 minutes per session I recommend "yoga for dummies" video Stick with a diabetic diet- get out info and review it  Labs today and then we will advise you further  Try your carpal tunnel wrist splints at night  I'm glad headaches are improved

## 2013-03-31 NOTE — Assessment & Plan Note (Signed)
Improved with beta blocker so far - expect further imp Disc lifestyle change also

## 2013-03-31 NOTE — Assessment & Plan Note (Signed)
Improvement with lantus thus far at 50 u - but still some higher sugars at night Enc pt to go back to literature and rev DM diet Plan made for wt loss and to inc exercise frequency and intensity alos

## 2013-03-31 NOTE — Assessment & Plan Note (Signed)
Discussed how this problem influences overall health and the risks it imposes  Reviewed plan for weight loss with lower calorie diet (via better food choices and also portion control or program like weight watchers) and exercise building up to or more than 30 minutes 5 days per week including some aerobic activity    

## 2013-03-31 NOTE — Assessment & Plan Note (Signed)
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

## 2013-04-23 ENCOUNTER — Other Ambulatory Visit: Payer: Self-pay | Admitting: Family Medicine

## 2013-04-25 NOTE — Telephone Encounter (Signed)
done

## 2013-04-25 NOTE — Telephone Encounter (Signed)
Electronic refill request, please advise  

## 2013-04-25 NOTE — Telephone Encounter (Signed)
Please refill for a year, thanks 

## 2013-06-24 ENCOUNTER — Other Ambulatory Visit: Payer: Self-pay | Admitting: Family Medicine

## 2013-06-30 ENCOUNTER — Telehealth: Payer: Self-pay | Admitting: Family Medicine

## 2013-06-30 DIAGNOSIS — IMO0001 Reserved for inherently not codable concepts without codable children: Secondary | ICD-10-CM

## 2013-06-30 NOTE — Telephone Encounter (Signed)
Message copied by Judy Pimple on Thu Jun 30, 2013  4:33 PM ------      Message from: Alvina Chou      Created: Mon Jun 27, 2013 10:48 AM      Regarding: Lab orders for Friday, 10.3.14       Labs for f/u ------

## 2013-07-01 ENCOUNTER — Other Ambulatory Visit (INDEPENDENT_AMBULATORY_CARE_PROVIDER_SITE_OTHER): Payer: BC Managed Care – PPO

## 2013-07-01 DIAGNOSIS — IMO0001 Reserved for inherently not codable concepts without codable children: Secondary | ICD-10-CM

## 2013-07-01 LAB — HEMOGLOBIN A1C: Hgb A1c MFr Bld: 6.7 % — ABNORMAL HIGH (ref 4.6–6.5)

## 2013-07-04 ENCOUNTER — Other Ambulatory Visit: Payer: Self-pay | Admitting: *Deleted

## 2013-07-04 MED ORDER — LANSOPRAZOLE 15 MG PO CPDR: 15.0000 mg | DELAYED_RELEASE_CAPSULE | Freq: Every day | ORAL | Status: DC

## 2013-07-08 ENCOUNTER — Ambulatory Visit (INDEPENDENT_AMBULATORY_CARE_PROVIDER_SITE_OTHER): Payer: BC Managed Care – PPO | Admitting: Family Medicine

## 2013-07-08 ENCOUNTER — Encounter: Payer: Self-pay | Admitting: Family Medicine

## 2013-07-08 VITALS — BP 134/68 | HR 77 | Temp 98.1°F | Ht 63.0 in | Wt 219.2 lb

## 2013-07-08 DIAGNOSIS — IMO0001 Reserved for inherently not codable concepts without codable children: Secondary | ICD-10-CM

## 2013-07-08 DIAGNOSIS — F411 Generalized anxiety disorder: Secondary | ICD-10-CM

## 2013-07-08 DIAGNOSIS — F419 Anxiety disorder, unspecified: Secondary | ICD-10-CM

## 2013-07-08 MED ORDER — LANSOPRAZOLE 15 MG PO CPDR: 15.0000 mg | DELAYED_RELEASE_CAPSULE | Freq: Every day | ORAL | Status: DC

## 2013-07-08 MED ORDER — ALPRAZOLAM 0.25 MG PO TABS: 0.2500 mg | ORAL_TABLET | Freq: Every day | ORAL | Status: DC | PRN

## 2013-07-08 MED ORDER — CITALOPRAM HYDROBROMIDE 40 MG PO TABS: 40.0000 mg | ORAL_TABLET | Freq: Every day | ORAL | Status: DC

## 2013-07-08 NOTE — Progress Notes (Signed)
Subjective:    Patient ID: Stacy Monroe, female    DOB: 07/03/1966, 47 y.o.   MRN: [id number]  HPI Here for f/u of DM  Wt is down 1 lb with bmi of 38  Diabetes Home sugar results - improved back on medicine  DM diet  Exercise  Symptoms- neuropathy- feels like she is "walking on pads" and also pain and ache A1C last  Lab Results  Component Value Date   HGBA1C 6.7* 07/01/2013   This is down from 7.1- prev was off med for a week  No problems with medications  Renal protection- ace  Last eye exam -April   Mentally is in a funk  Always worse in the month of October  Stressed to the max Having a lot of panic attacks - at all hours of the day  Has not been able to sleep  Ran out of xanax Family notices she is irritable   Stressors - ? Rel to work (starts working in [employer] after a summer off)- this year her work load inc after someone quit  She is taking night classes - and stretched as far as she can      No problems as a kid or teen Had bad post partum depression - saw a psychiatrist at that time and she took prozac for a while  Patient Active Problem List   Diagnosis Date Noted  . Hand numbness 03/30/2013  . Cough 12/15/2012  . Left shoulder pain 02/25/2012  . Neck pain on left side 02/25/2012  . Abnormal EKG 01/08/2012  . Chest pressure 12/04/2011  . Yeast vaginitis 07/25/2011  . Anxiety 06/24/2011  . Fatigue 01/21/2011  . Elevated transaminase level 01/21/2011  . PLANTAR FASCIITIS 10/02/2010  . WEIGHT LOSS-ABNORMAL 05/07/2010  . Nonspecific (abnormal) findings on radiological and other examination of body structure 05/07/2010  . UNSPECIFIED VITAMIN D DEFICIENCY 04/26/2010  . HYPERTENSION 02/15/2009  . Type II or unspecified type diabetes mellitus without mention of complication, uncontrolled 09/07/2008  . HYPERLIPIDEMIA 09/07/2008  . OBESITY 04/24/2008  . COMMON MIGRAINE 04/18/2008   Past Medical History  Diagnosis Date  . Hypertension   . Hyperlipidemia    . Diabetes mellitus     type II controlled  . Palpitations   . Obesity   . Migraine     common  . Vertigo   . Plantar fasciitis   . S/P tonsillectomy    Past Surgical History  Procedure Laterality Date  . Bladder surgery  1990    bladder tack after 2nd delivery   History  Substance Use Topics  . Smoking status: Never Smoker   . Smokeless tobacco: Never Used  . Alcohol Use: No   Family History  Problem Relation Age of Onset  . Arthritis Mother     RA  . Hypertension Mother   . Drug abuse Mother   . Depression Mother   . Cancer Sister     thyroid cancer, reoccured 2008  . Depression Sister   . Drug abuse Brother   . Stroke Maternal Grandmother   . Alzheimer's disease Maternal Grandmother   . Depression Sister   . Depression Sister    Allergies  Allergen Reactions  . Shellfish Allergy    Current Outpatient Prescriptions on File Prior to Visit  Medication Sig Dispense Refill  . ALPRAZolam (XANAX) 0.25 MG tablet TAKE ONE TABLET BY MOUTH THREE TIMES DAILY AS NEEDED FOR ANXIETY  30 tablet  2  . aspirin 81 MG tablet Take 81 mg  by mouth daily.        Marland Kitchen atorvastatin (LIPITOR) 20 MG tablet TAKE ONE TABLET BY MOUTH ONCE DAILY  30 tablet  5  . cetirizine (ZYRTEC) 10 MG tablet TAKE ONE TABLET BY MOUTH EVERY DAY  30 tablet  2  . citalopram (CELEXA) 10 MG tablet TAKE ONE TABLET BY MOUTH ONCE DAILY  30 tablet  5  . eletriptan (RELPAX) 40 MG tablet One tablet by mouth at onset of headache. May repeat in 2 hours if headache persists or recurs. 1 tablet by mouth at onset of headache, repeat in 2 hours if not resolved.  10 tablet  5  . glimepiride (AMARYL) 4 MG tablet TAKE ONE TABLET BY MOUTH ONCE DAILY BEFORE BREAKFAST  30 tablet  5  . glucose blood test strip test 3-4 times daily or as needed. DM 250.00  100 each  12  . hyoscyamine (LEVSIN SL) 0.125 MG SL tablet Place 0.125 mg under the tongue every 6 (six) hours as needed. for abdominal cramps.       . insulin glargine (LANTUS  SOLOSTAR) 100 UNIT/ML injection Inject 0.5 mLs (50 Units total) into the skin at bedtime.  5 pen  5  . lisinopril (PRINIVIL,ZESTRIL) 20 MG tablet TAKE ONE TABLET BY MOUTH EVERY DAY  30 tablet  6  . metFORMIN (GLUCOPHAGE-XR) 500 MG 24 hr tablet TAKE TWO TABLETS BY MOUTH IN THE EVENING  60 tablet  2  . Naproxen Sodium (ALEVE PO) Take by mouth. As directed      . ZETIA 10 MG tablet TAKE ONE TABLET BY MOUTH ONCE DAILY  30 tablet  2  . lansoprazole (PREVACID 24HR) 15 MG capsule Take 1 capsule (15 mg total) by mouth daily.  90 capsule  1  . metoprolol succinate (TOPROL-XL) 25 MG 24 hr tablet Take 1 tablet (25 mg total) by mouth daily.  30 tablet  3  . [DISCONTINUED] ipratropium (ATROVENT) 0.03 % nasal spray Place 2 sprays into the nose 3 (three) times daily.  30 mL  2   No current facility-administered medications on file prior to visit.    Review of Systems Review of Systems  Constitutional: Negative for fever, appetite change, fatigue and unexpected weight change.  Eyes: Negative for pain and visual disturbance.  Respiratory: Negative for cough and shortness of breath.   Cardiovascular: Negative for cp or palpitations    Gastrointestinal: Negative for nausea, diarrhea and constipation.  Genitourinary: Negative for urgency and frequency.  Skin: Negative for pallor or rash   Neurological: Negative for weakness, light-headedness, numbness and headaches.  Hematological: Negative for adenopathy. Does not bruise/bleed easily.  Psychiatric/Behavioral: pos for dysphoric mood and irritability/ neg for worry/ anx/ neg for SI         Objective:   Physical Exam  Constitutional: She appears well-developed and well-nourished. No distress.  obese and well appearing   HENT:  Head: Normocephalic and atraumatic.  Mouth/Throat: Oropharynx is clear and moist.  Eyes: Conjunctivae and EOM are normal. Pupils are equal, round, and reactive to light. Right eye exhibits no discharge. Left eye exhibits no  discharge. No scleral icterus.  Neck: Normal range of motion. Neck supple. No JVD present. No thyromegaly present.  Cardiovascular: Normal rate and regular rhythm.   No murmur heard. Pulmonary/Chest: Effort normal and breath sounds normal. No respiratory distress. She has no wheezes. She has no rales.  Abdominal: Soft. Bowel sounds are normal. She exhibits no distension and no mass. There is no tenderness.  Musculoskeletal: She exhibits no edema.  Lymphadenopathy:    She has no cervical adenopathy.  Neurological: She is alert. She has normal reflexes.  Skin: Skin is warm and dry. No rash noted.  Psychiatric: Her speech is normal. Thought content normal. Her affect is angry. Her affect is not inappropriate. Thought content is not paranoid. Cognition and memory are normal. She exhibits a depressed mood. She expresses no homicidal and no suicidal ideation.  Depressed/ sad and irritable  occ tearful  Very frustrated Disc stressors freely She is attentive.          Assessment & Plan:

## 2013-07-08 NOTE — Patient Instructions (Signed)
Increase citalopram to 20 mg once daily If doing well and want to increase it - increase to 40 mg once daily in 2 weeks Use xanax for emergencies (severe anxiety)  If problems or side effects- let me know in the meantime  Follow up with me in 4-6 weeks please

## 2013-07-10 NOTE — Assessment & Plan Note (Signed)
Improved with titration of medicines  Pt has been unsuccessful with lifestyle change or wt loss due to depression and dysmotivation Disc this in detail  Rev lifestyle habits Continue current med  F/u 3 mo after attempt to imp mood

## 2013-07-10 NOTE — Assessment & Plan Note (Signed)
With frustration and anhedonia and lack of motivation as well as irritability Pt has a fair amt of anger and frustration Rev stressors/coping tech/ symptoms/ tx opt and side eff in detail today Pt has acess to counseling at her work place Will titrate citalopram to 40- watching for SI or other side eff F/u planned >25 min spent with face to face with patient, >50% counseling and/or coordinating care

## 2013-07-31 ENCOUNTER — Other Ambulatory Visit: Payer: Self-pay | Admitting: Family Medicine

## 2013-08-02 ENCOUNTER — Other Ambulatory Visit: Payer: Self-pay | Admitting: Family Medicine

## 2013-08-02 MED ORDER — INSULIN GLARGINE 100 UNIT/ML ~~LOC~~ SOLN: 50.0000 [IU] | Freq: Every day | SUBCUTANEOUS | Status: DC

## 2013-08-04 ENCOUNTER — Other Ambulatory Visit: Payer: Self-pay

## 2013-08-19 ENCOUNTER — Ambulatory Visit: Payer: BC Managed Care – PPO | Admitting: Family Medicine

## 2013-08-19 DIAGNOSIS — Z0289 Encounter for other administrative examinations: Secondary | ICD-10-CM

## 2013-09-26 ENCOUNTER — Other Ambulatory Visit: Payer: Self-pay | Admitting: Family Medicine

## 2013-09-27 NOTE — Telephone Encounter (Signed)
Pt no-showed last appt, Rx declined and I advise pharmacy she is due for f/u

## 2013-10-02 ENCOUNTER — Encounter: Payer: Self-pay | Admitting: Family Medicine

## 2013-10-03 MED ORDER — INSULIN GLARGINE 100 UNIT/ML ~~LOC~~ SOLN: 50.0000 [IU] | Freq: Every day | SUBCUTANEOUS | Status: DC

## 2013-10-03 MED ORDER — METFORMIN HCL ER 500 MG PO TB24: ORAL_TABLET | ORAL | Status: DC

## 2013-10-03 MED ORDER — EZETIMIBE 10 MG PO TABS: ORAL_TABLET | ORAL | Status: DC

## 2013-11-01 ENCOUNTER — Ambulatory Visit (INDEPENDENT_AMBULATORY_CARE_PROVIDER_SITE_OTHER): Payer: BC Managed Care – PPO | Admitting: Family Medicine

## 2013-11-01 ENCOUNTER — Other Ambulatory Visit: Payer: Self-pay | Admitting: Family Medicine

## 2013-11-01 ENCOUNTER — Encounter: Payer: Self-pay | Admitting: Family Medicine

## 2013-11-01 VITALS — BP 112/62 | HR 62 | Temp 98.4°F | Ht 63.0 in | Wt 220.5 lb

## 2013-11-01 DIAGNOSIS — E669 Obesity, unspecified: Secondary | ICD-10-CM

## 2013-11-01 DIAGNOSIS — I10 Essential (primary) hypertension: Secondary | ICD-10-CM

## 2013-11-01 DIAGNOSIS — E1165 Type 2 diabetes mellitus with hyperglycemia: Principal | ICD-10-CM

## 2013-11-01 DIAGNOSIS — IMO0001 Reserved for inherently not codable concepts without codable children: Secondary | ICD-10-CM

## 2013-11-01 DIAGNOSIS — F4323 Adjustment disorder with mixed anxiety and depressed mood: Secondary | ICD-10-CM

## 2013-11-01 LAB — HEMOGLOBIN A1C: Hgb A1c MFr Bld: 6.5 % (ref 4.6–6.5)

## 2013-11-01 MED ORDER — INSULIN GLARGINE 100 UNIT/ML SOLOSTAR PEN: 50.0000 [IU] | PEN_INJECTOR | Freq: Every day | SUBCUTANEOUS | Status: DC

## 2013-11-01 MED ORDER — METFORMIN HCL ER 500 MG PO TB24: ORAL_TABLET | ORAL | Status: DC

## 2013-11-01 MED ORDER — EZETIMIBE 10 MG PO TABS: ORAL_TABLET | ORAL | Status: DC

## 2013-11-01 MED ORDER — GLIMEPIRIDE 4 MG PO TABS: 4.0000 mg | ORAL_TABLET | Freq: Every day | ORAL | Status: DC

## 2013-11-01 MED ORDER — ATORVASTATIN CALCIUM 20 MG PO TABS: ORAL_TABLET | ORAL | Status: DC

## 2013-11-01 NOTE — Patient Instructions (Signed)
A1C today  Keep working hard on diet and exercise  I refilled medicines today - I printed them  I am glad you are doing better

## 2013-11-01 NOTE — Progress Notes (Signed)
Pre-visit discussion using our clinic review tool. No additional management support is needed unless otherwise documented below in the visit note.  

## 2013-11-01 NOTE — Progress Notes (Signed)
Subjective:    Patient ID: Stacy Monroe, female    DOB: [birth year], 48 y.o.   MRN: [id number]  HPI Here for f/u of chronic health problems  Wt is stable with bmi of 39 Weight is up and down - that frustrates her  Walking more  Has been eating salads and protein Has used the myfitness pal app - she does better with that     Diabetes Home sugar results - thinks she is doing well / lower than they were --varies with her period  DM diet -overall is watching sweets (had one bad week)  Exercise - walking more / at work sometimes (she is using a step tracker on her phone) - over the past mo has done 10 miles  Symptoms A1C last  Lab Results  Component Value Date   HGBA1C 6.7* 07/01/2013    No problems with medications  Renal protection ace Last eye exam    Lab on 07/01/2013  Component Date Value Range Status  . Hemoglobin A1C 07/01/2013 6.7* 4.6 - 6.5 % Final   Glycemic Control Guidelines for People with Diabetes:Non Diabetic:  <6%Goal of Therapy: <7%Additional Action Suggested:  >8%      Depression  Went up on celexa to 40 mg and much better in terms of mood    Patient Active Problem List   Diagnosis Date Noted  . Hand numbness 03/30/2013  . Cough 12/15/2012  . Left shoulder pain 02/25/2012  . Neck pain on left side 02/25/2012  . Abnormal EKG 01/08/2012  . Chest pressure 12/04/2011  . Yeast vaginitis 07/25/2011  . Adjustment disorder with mixed anxiety and depressed mood 06/24/2011  . Fatigue 01/21/2011  . Elevated transaminase level 01/21/2011  . PLANTAR FASCIITIS 10/02/2010  . WEIGHT LOSS-ABNORMAL 05/07/2010  . Nonspecific (abnormal) findings on radiological and other examination of body structure 05/07/2010  . UNSPECIFIED VITAMIN D DEFICIENCY 04/26/2010  . HYPERTENSION 02/15/2009  . Type II or unspecified type diabetes mellitus without mention of complication, uncontrolled 09/07/2008  . HYPERLIPIDEMIA 09/07/2008  . OBESITY 04/24/2008  . COMMON MIGRAINE  04/18/2008   Past Medical History  Diagnosis Date  . Hypertension   . Hyperlipidemia   . Diabetes mellitus     type II controlled  . Palpitations   . Obesity   . Migraine     common  . Vertigo   . Plantar fasciitis   . S/P tonsillectomy    Past Surgical History  Procedure Laterality Date  . Bladder surgery  1990    bladder tack after 2nd delivery   History  Substance Use Topics  . Smoking status: Never Smoker   . Smokeless tobacco: Never Used  . Alcohol Use: No   Family History  Problem Relation Age of Onset  . Arthritis Mother     RA  . Hypertension Mother   . Drug abuse Mother   . Depression Mother   . Cancer Sister     thyroid cancer, reoccured 2008  . Depression Sister   . Drug abuse Brother   . Stroke Maternal Grandmother   . Alzheimer's disease Maternal Grandmother   . Depression Sister   . Depression Sister    Allergies  Allergen Reactions  . Shellfish Allergy    Current Outpatient Prescriptions on File Prior to Visit  Medication Sig Dispense Refill  . ALPRAZolam (XANAX) 0.25 MG tablet Take 1 tablet (0.25 mg total) by mouth daily as needed for sleep.  30 tablet  0  . aspirin 81 MG  tablet Take 81 mg by mouth daily.        . cetirizine (ZYRTEC) 10 MG tablet TAKE ONE TABLET BY MOUTH EVERY DAY  30 tablet  2  . citalopram (CELEXA) 40 MG tablet Take 1 tablet (40 mg total) by mouth daily.  30 tablet  11  . eletriptan (RELPAX) 40 MG tablet One tablet by mouth at onset of headache. May repeat in 2 hours if headache persists or recurs. 1 tablet by mouth at onset of headache, repeat in 2 hours if not resolved.  10 tablet  5  . glucose blood test strip test 3-4 times daily or as needed. DM 250.00  100 each  12  . hyoscyamine (LEVSIN SL) 0.125 MG SL tablet Place 0.125 mg under the tongue every 6 (six) hours as needed. for abdominal cramps.       . insulin glargine (LANTUS) 100 UNIT/ML injection Inject 0.5 mLs (50 Units total) into the skin at bedtime. For DM 250.0   15 mL  0  . lansoprazole (PREVACID) 15 MG capsule Take 1 capsule (15 mg total) by mouth daily. OTC please  90 capsule  3  . lisinopril (PRINIVIL,ZESTRIL) 20 MG tablet TAKE ONE TABLET BY MOUTH ONCE DAILY  30 tablet  5  . metoprolol succinate (TOPROL-XL) 25 MG 24 hr tablet Take 1 tablet (25 mg total) by mouth daily.  30 tablet  3  . Naproxen Sodium (ALEVE PO) Take by mouth. As directed      . [DISCONTINUED] ipratropium (ATROVENT) 0.03 % nasal spray Place 2 sprays into the nose 3 (three) times daily.  30 mL  2   No current facility-administered medications on file prior to visit.    Review of Systems Review of Systems  Constitutional: Negative for fever, appetite change, fatigue and unexpected weight change.  Eyes: Negative for pain and visual disturbance.  Respiratory: Negative for cough and shortness of breath.   Cardiovascular: Negative for cp or palpitations    Gastrointestinal: Negative for nausea, diarrhea and constipation.  Genitourinary: Negative for urgency and frequency.  Skin: Negative for pallor or rash   Neurological: Negative for weakness, light-headedness, numbness and headaches.  Hematological: Negative for adenopathy. Does not bruise/bleed easily.  Psychiatric/Behavioral: Negative for dysphoric mood. The patient is not nervous/anxious.  pos for better mood with better motivation        Objective:   Physical Exam  Constitutional: She appears well-developed and well-nourished. No distress.  obese and well appearing   HENT:  Head: Normocephalic and atraumatic.  Mouth/Throat: Oropharynx is clear and moist.  Eyes: Conjunctivae and EOM are normal. Pupils are equal, round, and reactive to light. Right eye exhibits no discharge. Left eye exhibits no discharge. No scleral icterus.  Neck: Normal range of motion. Neck supple. No JVD present. No thyromegaly present.  Cardiovascular: Normal rate, regular rhythm and intact distal pulses.  Exam reveals no gallop.   Pulmonary/Chest:  Effort normal and breath sounds normal. No respiratory distress. She has no wheezes. She has no rales.  Abdominal: Soft. Bowel sounds are normal. She exhibits no distension, no abdominal bruit and no mass. There is no tenderness. There is no rebound.  Musculoskeletal: She exhibits no edema and no tenderness.  Lymphadenopathy:    She has no cervical adenopathy.  Neurological: She is alert. She has normal reflexes. No cranial nerve deficit. She exhibits normal muscle tone. Coordination normal.  Skin: Skin is warm and dry. No rash noted. No pallor.  Psychiatric: She has a normal  mood and affect.  Much improved affect          Assessment & Plan:

## 2013-11-02 ENCOUNTER — Telehealth: Payer: Self-pay | Admitting: Family Medicine

## 2013-11-02 NOTE — Assessment & Plan Note (Signed)
Discussed how this problem influences overall health and the risks it imposes  Reviewed plan for weight loss with lower calorie diet (via better food choices and also portion control or program like weight watchers) and exercise building up to or more than 30 minutes 5 days per week including some aerobic activity    Worked on strategy

## 2013-11-02 NOTE — Assessment & Plan Note (Signed)
Expect improvement Better motivation and compliance with diet and exercise  Working at wt loss  Rev mt list- declines flu vaccine  Lab today for A1C Disc wt loss strategy

## 2013-11-02 NOTE — Telephone Encounter (Signed)
Relevant patient education assigned to patient using Emmi. ° °

## 2013-11-02 NOTE — Assessment & Plan Note (Signed)
Much improved with inc in celexa  Motivation is back  Will continue to follow

## 2013-11-02 NOTE — Assessment & Plan Note (Signed)
BP: 112/62 mmHg  bp in fair control at this time  No changes needed Disc lifstyle change with low sodium diet and exercise

## 2013-11-04 ENCOUNTER — Telehealth: Payer: Self-pay

## 2013-11-04 NOTE — Telephone Encounter (Signed)
Relevant patient education assigned to patient using Emmi. ° °

## 2013-11-25 ENCOUNTER — Ambulatory Visit (INDEPENDENT_AMBULATORY_CARE_PROVIDER_SITE_OTHER): Payer: BC Managed Care – PPO | Admitting: Family Medicine

## 2013-11-25 ENCOUNTER — Encounter: Payer: Self-pay | Admitting: Family Medicine

## 2013-11-25 ENCOUNTER — Ambulatory Visit (INDEPENDENT_AMBULATORY_CARE_PROVIDER_SITE_OTHER)
Admission: RE | Admit: 2013-11-25 | Discharge: 2013-11-25 | Disposition: A | Discharge status: Home / Self Care | Payer: BC Managed Care – PPO | Source: Ambulatory Visit | Attending: Family Medicine | Admitting: Family Medicine

## 2013-11-25 VITALS — BP 130/70 | HR 78 | Temp 97.7°F | Ht 63.0 in | Wt 222.2 lb

## 2013-11-25 DIAGNOSIS — M545 Low back pain, unspecified: Secondary | ICD-10-CM

## 2013-11-25 DIAGNOSIS — M549 Dorsalgia, unspecified: Secondary | ICD-10-CM

## 2013-11-25 LAB — POCT URINALYSIS DIPSTICK
BILIRUBIN UA: NEGATIVE
GLUCOSE UA: NEGATIVE
Leukocytes, UA: NEGATIVE
Nitrite, UA: NEGATIVE
SPEC GRAV UA: 1.025
Urobilinogen, UA: 0.2
pH, UA: 6

## 2013-11-25 MED ORDER — MELOXICAM 15 MG PO TABS: 15.0000 mg | ORAL_TABLET | Freq: Every day | ORAL | Status: DC

## 2013-11-25 MED ORDER — CYCLOBENZAPRINE HCL 10 MG PO TABS: 10.0000 mg | ORAL_TABLET | Freq: Three times a day (TID) | ORAL | Status: DC | PRN

## 2013-11-25 NOTE — Patient Instructions (Signed)
Take the meloxicam daily with food and do not mix with other pain medicines  Flexeril -muscle relaxer may sedate so use caution  Xray now -we will call with a result  Walking is ok - slowly  Try not to stay in the same position for too long    Back Pain, Adult Low back pain is very common. About 1 in 5 people have back pain.The cause of low back pain is rarely dangerous. The pain often gets better over time.About half of people with a sudden onset of back pain feel better in just 2 weeks. About 8 in 10 people feel better by 6 weeks.  CAUSES Some common causes of back pain include:  Strain of the muscles or ligaments supporting the spine.  Wear and tear (degeneration) of the spinal discs.  Arthritis.  Direct injury to the back. DIAGNOSIS Most of the time, the direct cause of low back pain is not known.However, back pain can be treated effectively even when the exact cause of the pain is unknown.Answering your caregiver's questions about your overall health and symptoms is one of the most accurate ways to make sure the cause of your pain is not dangerous. If your caregiver needs more information, he or she may order lab work or imaging tests (X-rays or MRIs).However, even if imaging tests show changes in your back, this usually does not require surgery. HOME CARE INSTRUCTIONS For many people, back pain returns.Since low back pain is rarely dangerous, it is often a condition that people can learn to [facility] their own.   Remain active. It is stressful on the back to sit or stand in one place. Do not sit, drive, or stand in one place for more than 30 minutes at a time. Take short walks on level surfaces as soon as pain allows.Try to increase the length of time you walk each day.  Do not stay in bed.Resting more than 1 or 2 days can delay your recovery.  Do not avoid exercise or work.Your body is made to move.It is not dangerous to be active, even though your back may hurt.Your  back will likely heal faster if you return to being active before your pain is gone.  Pay attention to your body when you bend and lift. Many people have less discomfortwhen lifting if they bend their knees, keep the load close to their bodies,and avoid twisting. Often, the most comfortable positions are those that put less stress on your recovering back.  Find a comfortable position to sleep. Use a firm mattress and lie on your side with your knees slightly bent. If you lie on your back, put a pillow under your knees.  Only take over-the-counter or prescription medicines as directed by your caregiver. Over-the-counter medicines to reduce pain and inflammation are often the most helpful.Your caregiver may prescribe muscle relaxant drugs.These medicines help dull your pain so you can more quickly return to your normal activities and healthy exercise.  Put ice on the injured area.  Put ice in a plastic bag.  Place a towel between your skin and the bag.  Leave the ice on for 15-20 minutes, 03-04 times a day for the first 2 to 3 days. After that, ice and heat may be alternated to reduce pain and spasms.  Ask your caregiver about trying back exercises and gentle massage. This may be of some benefit.  Avoid feeling anxious or stressed.Stress increases muscle tension and can worsen back pain.It is important to recognize when you are  anxious or stressed and learn ways to manage it.Exercise is a great option. SEEK MEDICAL CARE IF:  You have pain that is not relieved with rest or medicine.  You have pain that does not improve in 1 week.  You have new symptoms.  You are generally not feeling well. SEEK IMMEDIATE MEDICAL CARE IF:   You have pain that radiates from your back into your legs.  You develop new bowel or bladder control problems.  You have unusual weakness or numbness in your arms or legs.  You develop nausea or vomiting.  You develop abdominal pain.  You feel  faint. Document Released: 09/15/2005 Document Revised: 03/16/2012 Document Reviewed: 02/03/2011 [facility] Patient Information 2014 [place], [place].

## 2013-11-25 NOTE — Progress Notes (Signed)
Pre visit review using our clinic review tool, if applicable. No additional management support is needed unless otherwise documented below in the visit note. 

## 2013-11-25 NOTE — Progress Notes (Signed)
Subjective:    Patient ID: Stacy Monroe, female    DOB: [birth year], 48 y.o.   MRN: [id number]  HPI Here for back pain Going on for a few days   At first thought it was sciatica - since it radiates down to her R leg  Now across whole low back and both legs Hurts to sit / better to stand / she can walk for a bit and then it begins to bother her  Hurts worst in bed lying down -especially on her side  Wakes up very stiff and sore  No numbness or loss of strength  Is dull ache and then sharp- grabs her with sharp pain occasionally   She has had back pain in the past -not to this extent  Tried advil  Then tried "dones pills"  Tried aleve   Takes the edge off - but no relief   No loss of bowel or bladder control   Patient Active Problem List   Diagnosis Date Noted  . Hand numbness 03/30/2013  . Cough 12/15/2012  . Abnormal EKG 01/08/2012  . Chest pressure 12/04/2011  . Adjustment disorder with mixed anxiety and depressed mood 06/24/2011  . Elevated transaminase level 01/21/2011  . PLANTAR FASCIITIS 10/02/2010  . Nonspecific (abnormal) findings on radiological and other examination of body structure 05/07/2010  . UNSPECIFIED VITAMIN D DEFICIENCY 04/26/2010  . HYPERTENSION 02/15/2009  . Type II or unspecified type diabetes mellitus without mention of complication, uncontrolled 09/07/2008  . HYPERLIPIDEMIA 09/07/2008  . OBESITY 04/24/2008  . COMMON MIGRAINE 04/18/2008   Past Medical History  Diagnosis Date  . Hypertension   . Hyperlipidemia   . Diabetes mellitus     type II controlled  . Palpitations   . Obesity   . Migraine     common  . Vertigo   . Plantar fasciitis   . S/P tonsillectomy    Past Surgical History  Procedure Laterality Date  . Bladder surgery  1990    bladder tack after 2nd delivery   History  Substance Use Topics  . Smoking status: Never Smoker   . Smokeless tobacco: Never Used  . Alcohol Use: No   Family History  Problem Relation Age of  Onset  . Arthritis Mother     RA  . Hypertension Mother   . Drug abuse Mother   . Depression Mother   . Cancer Sister     thyroid cancer, reoccured 2008  . Depression Sister   . Drug abuse Brother   . Stroke Maternal Grandmother   . Alzheimer's disease Maternal Grandmother   . Depression Sister   . Depression Sister    Allergies  Allergen Reactions  . Shellfish Allergy    Current Outpatient Prescriptions on File Prior to Visit  Medication Sig Dispense Refill  . ALPRAZolam (XANAX) 0.25 MG tablet Take 1 tablet (0.25 mg total) by mouth daily as needed for sleep.  30 tablet  0  . aspirin 81 MG tablet Take 81 mg by mouth daily.        Marland Kitchen atorvastatin (LIPITOR) 20 MG tablet TAKE ONE TABLET BY MOUTH ONCE DAILY  30 tablet  11  . cetirizine (ZYRTEC) 10 MG tablet TAKE ONE TABLET BY MOUTH EVERY DAY  30 tablet  2  . citalopram (CELEXA) 40 MG tablet Take 1 tablet (40 mg total) by mouth daily.  30 tablet  11  . eletriptan (RELPAX) 40 MG tablet One tablet by mouth at onset of headache. May repeat in  2 hours if headache persists or recurs. 1 tablet by mouth at onset of headache, repeat in 2 hours if not resolved.  10 tablet  5  . ezetimibe (ZETIA) 10 MG tablet TAKE ONE TABLET BY MOUTH ONCE DAILY  30 tablet  11  . glimepiride (AMARYL) 4 MG tablet Take 1 tablet (4 mg total) by mouth daily with breakfast.  30 tablet  11  . glucose blood test strip test 3-4 times daily or as needed. DM 250.00  100 each  12  . hyoscyamine (LEVSIN SL) 0.125 MG SL tablet Place 0.125 mg under the tongue every 6 (six) hours as needed. for abdominal cramps.       . Insulin Glargine (LANTUS) 100 UNIT/ML Solostar Pen Inject 50 Units into the skin daily at 10 pm.  15 mL  11  . lansoprazole (PREVACID) 15 MG capsule Take 1 capsule (15 mg total) by mouth daily. OTC please  90 capsule  3  . lisinopril (PRINIVIL,ZESTRIL) 20 MG tablet TAKE ONE TABLET BY MOUTH ONCE DAILY  30 tablet  5  . metFORMIN (GLUCOPHAGE-XR) 500 MG 24 hr tablet  TAKE TWO TABLETS BY MOUTH IN THE EVENING  60 tablet  11  . metoprolol succinate (TOPROL-XL) 25 MG 24 hr tablet Take 1 tablet (25 mg total) by mouth daily.  30 tablet  3  . Naproxen Sodium (ALEVE PO) Take by mouth. As directed      . [DISCONTINUED] ipratropium (ATROVENT) 0.03 % nasal spray Place 2 sprays into the nose 3 (three) times daily.  30 mL  2   No current facility-administered medications on file prior to visit.      No urinary symptoms  Results for orders placed in visit on 11/25/13  POCT URINALYSIS DIPSTICK      Result Value Ref Range   Color, UA yellow     Clarity, UA hazy     Glucose, UA neg.     Bilirubin, UA neg.     Ketones, UA trace     Spec Grav, UA 1.025     Blood, UA trace     pH, UA 6.0     Protein, UA trace     Urobilinogen, UA 0.2     Nitrite, UA neg.     Leukocytes, UA Negative        Review of Systems Review of Systems  Constitutional: Negative for fever, appetite change, fatigue and unexpected weight change.  Eyes: Negative for pain and visual disturbance.  Respiratory: Negative for cough and shortness of breath.   Cardiovascular: Negative for cp or palpitations    Gastrointestinal: Negative for nausea, diarrhea and constipation.  Genitourinary: Negative for urgency and frequency.  Skin: Negative for pallor or rash   MSK pos for back pain with radiation Neurological: Negative for weakness, light-headedness, numbness and headaches.  Hematological: Negative for adenopathy. Does not bruise/bleed easily.  Psychiatric/Behavioral: Negative for dysphoric mood. The patient is not nervous/anxious.         Objective:   Physical Exam  Constitutional: She appears well-developed and well-nourished. No distress.  obese and well appearing   HENT:  Head: Normocephalic and atraumatic.  Neck: Normal range of motion. Neck supple.  Cardiovascular: Normal rate and regular rhythm.   Pulmonary/Chest: Effort normal and breath sounds normal.  Musculoskeletal:        Lumbar back: She exhibits decreased range of motion, tenderness, bony tenderness, pain and spasm. She exhibits no swelling and no edema.  Spasm and tenderness in R  peri lumbar musculature  Flex 90 deg Ext 10 deg Pain to lat flex left Nl gait No neuro findings   Neurological: She is alert. She has normal reflexes. No cranial nerve deficit. She exhibits normal muscle tone. Coordination normal.  Skin: Skin is warm and dry. No rash noted.  Psychiatric: She has a normal mood and affect.          Assessment & Plan:

## 2013-11-27 NOTE — Assessment & Plan Note (Signed)
Will tx with meloxicam and flexeril (with caution) Disc stretching/ walking and use of heat  If no imp will update  LS xray today in light of point tenderness and recurrent symptoms

## 2014-02-02 ENCOUNTER — Other Ambulatory Visit: Payer: Self-pay | Admitting: Family Medicine

## 2014-05-08 ENCOUNTER — Telehealth: Payer: Self-pay | Admitting: Family Medicine

## 2014-05-08 NOTE — Telephone Encounter (Signed)
Please schedule her for f/u in sept with lab prior (A1C and lipid)- thanks

## 2014-05-08 NOTE — Telephone Encounter (Signed)
Diabetic Bundle.  Pt is on report to call to check LDL.  Last done 08/2012.  No upcoming appts.  Would you like for me to schedule lab visit?

## 2014-05-09 ENCOUNTER — Encounter: Payer: Self-pay | Admitting: Family Medicine

## 2014-05-11 NOTE — Telephone Encounter (Signed)
Appt scheduled

## 2014-06-07 ENCOUNTER — Telehealth: Payer: Self-pay | Admitting: Family Medicine

## 2014-06-07 ENCOUNTER — Other Ambulatory Visit (INDEPENDENT_AMBULATORY_CARE_PROVIDER_SITE_OTHER): Payer: BC Managed Care – PPO

## 2014-06-07 DIAGNOSIS — E785 Hyperlipidemia, unspecified: Secondary | ICD-10-CM

## 2014-06-07 DIAGNOSIS — R740 Nonspecific elevation of levels of transaminase and lactic acid dehydrogenase [LDH]: Secondary | ICD-10-CM

## 2014-06-07 DIAGNOSIS — R7401 Elevation of levels of liver transaminase levels: Secondary | ICD-10-CM

## 2014-06-07 DIAGNOSIS — E1165 Type 2 diabetes mellitus with hyperglycemia: Secondary | ICD-10-CM

## 2014-06-07 DIAGNOSIS — I10 Essential (primary) hypertension: Secondary | ICD-10-CM

## 2014-06-07 DIAGNOSIS — R7402 Elevation of levels of lactic acid dehydrogenase (LDH): Secondary | ICD-10-CM

## 2014-06-07 DIAGNOSIS — IMO0001 Reserved for inherently not codable concepts without codable children: Secondary | ICD-10-CM

## 2014-06-07 LAB — COMPREHENSIVE METABOLIC PANEL
ALBUMIN: 4 g/dL (ref 3.5–5.2)
ALT: 25 U/L (ref 0–35)
AST: 20 U/L (ref 0–37)
Alkaline Phosphatase: 43 U/L (ref 39–117)
BUN: 13 mg/dL (ref 6–23)
CALCIUM: 9.3 mg/dL (ref 8.4–10.5)
CHLORIDE: 103 meq/L (ref 96–112)
CO2: 28 mEq/L (ref 19–32)
CREATININE: 0.8 mg/dL (ref 0.4–1.2)
GFR: 85.48 mL/min (ref >60.00)
Glucose, Bld: 72 mg/dL (ref 70–99)
POTASSIUM: 4.5 meq/L (ref 3.5–5.1)
Sodium: 138 mEq/L (ref 135–145)
Total Bilirubin: 0.3 mg/dL (ref 0.2–1.2)
Total Protein: 7.1 g/dL (ref 6.0–8.3)

## 2014-06-07 LAB — LIPID PANEL
Cholesterol: 138 mg/dL (ref 0–200)
HDL: 34.8 mg/dL — AB (ref >39.00)
LDL Cholesterol: 73 mg/dL (ref 0–99)
NonHDL: 103.2
TRIGLYCERIDES: 153 mg/dL — AB (ref 0.0–149.0)
Total CHOL/HDL Ratio: 4
VLDL: 30.6 mg/dL (ref 0.0–40.0)

## 2014-06-07 LAB — HEMOGLOBIN A1C: Hgb A1c MFr Bld: 6.7 % — ABNORMAL HIGH (ref 4.6–6.5)

## 2014-06-07 NOTE — Telephone Encounter (Signed)
Message copied by Abner Greenspan on Wed Jun 07, 2014  6:51 AM ------      Message from: Ellamae Sia      Created: Mon May 29, 2014  4:36 PM      Regarding: Lab orders for Wednesday, 9.9.15       Diabetic bundle, needs A1C and Lipid ------

## 2014-06-14 ENCOUNTER — Ambulatory Visit (INDEPENDENT_AMBULATORY_CARE_PROVIDER_SITE_OTHER): Payer: BC Managed Care – PPO | Admitting: Family Medicine

## 2014-06-14 ENCOUNTER — Encounter: Payer: Self-pay | Admitting: Family Medicine

## 2014-06-14 VITALS — BP 124/70 | HR 72 | Temp 98.4°F | Ht 63.0 in | Wt 221.8 lb

## 2014-06-14 DIAGNOSIS — E785 Hyperlipidemia, unspecified: Secondary | ICD-10-CM

## 2014-06-14 DIAGNOSIS — N920 Excessive and frequent menstruation with regular cycle: Secondary | ICD-10-CM | POA: Insufficient documentation

## 2014-06-14 DIAGNOSIS — I10 Essential (primary) hypertension: Secondary | ICD-10-CM

## 2014-06-14 DIAGNOSIS — E669 Obesity, unspecified: Secondary | ICD-10-CM

## 2014-06-14 DIAGNOSIS — E1165 Type 2 diabetes mellitus with hyperglycemia: Principal | ICD-10-CM

## 2014-06-14 DIAGNOSIS — IMO0001 Reserved for inherently not codable concepts without codable children: Secondary | ICD-10-CM

## 2014-06-14 DIAGNOSIS — N921 Excessive and frequent menstruation with irregular cycle: Secondary | ICD-10-CM

## 2014-06-14 NOTE — Progress Notes (Signed)
Subjective:    Patient ID: Stacy Monroe, female    DOB: 1966-02-28, 48 y.o.   MRN: [id number]  HPI  Here for f/u of chronic medical problems   Wt is stable with bmi of 39   Has been ok overall  Nothing new going on   Still dealing with menstrual problems  Has not had time to make an appt with anyone  Bleeding all the time / not always steady / at least spotting every day  Wants ref to gyn   Diabetes Home sugar results - mornings have been good - afternoon varies a bit more  DM diet - is about the same , she "does not eat a lot" - she eats oatmeal or small bagel with pb , sandwiches otherwise, not enough green veg , not craving sugar however  Exercise - some exercise - walking , but not enough  Symptoms -none  A1C last  Lab Results  Component Value Date   HGBA1C 6.7* 06/07/2014   Prev 6.5-not much of a change  No problems with medications  Renal protection- on ace  Last eye exam - 3/15 -was ok , she does have the beginnings of cataracts     Cholesterol Eating leaner things-turkey and chicken  Lab Results  Component Value Date   CHOL 138 06/07/2014   CHOL 150 03/30/2013   CHOL 137 09/08/2012   Lab Results  Component Value Date   HDL 34.80* 06/07/2014   HDL 41.10 03/30/2013   HDL 37.10* 09/08/2012   Lab Results  Component Value Date   LDLCALC 73 06/07/2014   LDLCALC 71 03/30/2013   [place] 80 09/08/2012   Lab Results  Component Value Date   TRIG 153.0* 06/07/2014   TRIG 191.0* 03/30/2013   TRIG 99.0 09/08/2012   Lab Results  Component Value Date   CHOLHDL 4 06/07/2014   CHOLHDL 4 03/30/2013   CHOLHDL 4 09/08/2012   Lab Results  Component Value Date   LDLDIRECT 136.4 10/17/2011   LDLDIRECT 148.4 01/17/2011   LDLDIRECT 182.9 09/27/2010    Overall pretty stable with HDL lower  Knows she needs to exercise more    Patient Active Problem List   Diagnosis Date Noted  . Low back pain 11/25/2013  . Hand numbness 03/30/2013  . Cough 12/15/2012  . Abnormal EKG  01/08/2012  . Chest pressure 12/04/2011  . Adjustment disorder with mixed anxiety and depressed mood 06/24/2011  . Elevated transaminase level 01/21/2011  . PLANTAR FASCIITIS 10/02/2010  . Nonspecific (abnormal) findings on radiological and other examination of body structure 05/07/2010  . UNSPECIFIED VITAMIN D DEFICIENCY 04/26/2010  . HYPERTENSION 02/15/2009  . Type II or unspecified type diabetes mellitus without mention of complication, uncontrolled 09/07/2008  . HYPERLIPIDEMIA 09/07/2008  . OBESITY 04/24/2008  . COMMON MIGRAINE 04/18/2008   Past Medical History  Diagnosis Date  . Hypertension   . Hyperlipidemia   . Diabetes mellitus     type II controlled  . Palpitations   . Obesity   . Migraine     common  . Vertigo   . Plantar fasciitis   . S/P tonsillectomy    Past Surgical History  Procedure Laterality Date  . Bladder surgery  1990    bladder tack after 2nd delivery   History  Substance Use Topics  . Smoking status: Never Smoker   . Smokeless tobacco: Never Used  . Alcohol Use: No   Family History  Problem Relation Age of Onset  . Arthritis  Mother     RA  . Hypertension Mother   . Drug abuse Mother   . Depression Mother   . Cancer Sister     thyroid cancer, reoccured 2008  . Depression Sister   . Drug abuse Brother   . Stroke Maternal Grandmother   . Alzheimer's disease Maternal Grandmother   . Depression Sister   . Depression Sister    Allergies  Allergen Reactions  . Shellfish Allergy    Current Outpatient Prescriptions on File Prior to Visit  Medication Sig Dispense Refill  . ALPRAZolam (XANAX) 0.25 MG tablet Take 1 tablet (0.25 mg total) by mouth daily as needed for sleep.  30 tablet  0  . aspirin 81 MG tablet Take 81 mg by mouth daily.        Marland Kitchen atorvastatin (LIPITOR) 20 MG tablet TAKE ONE TABLET BY MOUTH ONCE DAILY  30 tablet  11  . cetirizine (ZYRTEC) 10 MG tablet TAKE ONE TABLET BY MOUTH EVERY DAY  30 tablet  2  . citalopram (CELEXA) 40  MG tablet Take 1 tablet (40 mg total) by mouth daily.  30 tablet  11  . cyclobenzaprine (FLEXERIL) 10 MG tablet Take 1 tablet (10 mg total) by mouth 3 (three) times daily as needed for muscle spasms.  30 tablet  0  . eletriptan (RELPAX) 40 MG tablet One tablet by mouth at onset of headache. May repeat in 2 hours if headache persists or recurs. 1 tablet by mouth at onset of headache, repeat in 2 hours if not resolved.  10 tablet  5  . ezetimibe (ZETIA) 10 MG tablet TAKE ONE TABLET BY MOUTH ONCE DAILY  30 tablet  11  . glimepiride (AMARYL) 4 MG tablet Take 1 tablet (4 mg total) by mouth daily with breakfast.  30 tablet  11  . glucose blood test strip test 3-4 times daily or as needed. DM 250.00  100 each  12  . Insulin Glargine (LANTUS) 100 UNIT/ML Solostar Pen Inject 50 Units into the skin daily at 10 pm.  15 mL  11  . lansoprazole (PREVACID) 15 MG capsule Take 1 capsule (15 mg total) by mouth daily. OTC please  90 capsule  3  . lisinopril (PRINIVIL,ZESTRIL) 20 MG tablet TAKE ONE TABLET BY MOUTH ONCE DAILY  30 tablet  5  . metFORMIN (GLUCOPHAGE-XR) 500 MG 24 hr tablet TAKE TWO TABLETS BY MOUTH IN THE EVENING  60 tablet  11  . metoprolol succinate (TOPROL-XL) 25 MG 24 hr tablet Take 1 tablet (25 mg total) by mouth daily.  30 tablet  3  . [DISCONTINUED] ipratropium (ATROVENT) 0.03 % nasal spray Place 2 sprays into the nose 3 (three) times daily.  30 mL  2   No current facility-administered medications on file prior to visit.    Review of Systems    Review of Systems  Constitutional: Negative for fever, appetite change, and unexpected weight change. pos for fatigue  Eyes: Negative for pain and visual disturbance.  Respiratory: Negative for cough and shortness of breath.   Cardiovascular: Negative for cp or palpitations    Gastrointestinal: Negative for nausea, diarrhea and constipation.  Genitourinary: Negative for urgency and frequency.  Skin: Negative for pallor or rash   Neurological:  Negative for weakness, light-headedness, numbness and headaches.  Hematological: Negative for adenopathy. Does not bruise/bleed easily.  Psychiatric/Behavioral: Negative for dysphoric mood. The patient is not nervous/anxious.      Objective:   Physical Exam  Constitutional: She appears  well-developed and well-nourished. No distress.  obese and well appearing   HENT:  Head: Normocephalic and atraumatic.  Mouth/Throat: Oropharynx is clear and moist.  Eyes: Conjunctivae and EOM are normal. Pupils are equal, round, and reactive to light. No scleral icterus.  Neck: Normal range of motion. Neck supple. No JVD present. Carotid bruit is not present. No thyromegaly present.  Cardiovascular: Normal rate, regular rhythm, normal heart sounds and intact distal pulses.  Exam reveals no gallop.   Pulmonary/Chest: Effort normal and breath sounds normal. No respiratory distress. She has no wheezes. She has no rales.  Abdominal: Soft. Bowel sounds are normal. She exhibits no distension and no mass. There is no tenderness.  Musculoskeletal: She exhibits no edema and no tenderness.  Lymphadenopathy:    She has no cervical adenopathy.  Neurological: She is alert. She has normal reflexes. No cranial nerve deficit. She exhibits normal muscle tone. Coordination normal.  Skin: Skin is warm and dry. No rash noted. No erythema. No pallor.  Psychiatric: She has a normal mood and affect.          Assessment & Plan:   Problem List Items Addressed This Visit     Cardiovascular and Mediastinum   HYPERTENSION - Primary     Endocrine   Type II or unspecified type diabetes mellitus without mention of complication, uncontrolled      Lab Results  Component Value Date   HGBA1C 6.7* 06/07/2014   Overall fairly stable  Rev low glycemic diet/ exercise and need for wt loss  No change in medicines  F/u in 6 mo      Other   HYPERLIPIDEMIA     Disc goals for lipids and reasons to control them Rev labs with  pt Rev low sat fat diet in detail Overall stable  Continue lipitor and zetia  F/u 6 mo    OBESITY     Discussed how this problem influences overall health and the risks it imposes  Reviewed plan for weight loss with lower calorie diet (via better food choices and also portion control or program like weight watchers) and exercise building up to or more than 30 minutes 5 days per week including some aerobic activity       Menorrhagia     Ongoing menses-almost constant in between heavy flow and spotting Pt is obese  Also perimenopausal  Ref to gyn for further eval     Relevant Orders      Ambulatory referral to Gynecology

## 2014-06-14 NOTE — Patient Instructions (Signed)
Labs look fairly stable  Work on weight loss and exercise Stop at check out for referral to gyn  Follow up here in 6 months for annual exam with labs prior

## 2014-06-14 NOTE — Progress Notes (Signed)
Pre visit review using our clinic review tool, if applicable. No additional management support is needed unless otherwise documented below in the visit note. 

## 2014-06-15 NOTE — Assessment & Plan Note (Signed)
Ongoing menses-almost constant in between heavy flow and spotting Pt is obese  Also perimenopausal  Ref to gyn for further eval

## 2014-06-15 NOTE — Assessment & Plan Note (Signed)
Discussed how this problem influences overall health and the risks it imposes  Reviewed plan for weight loss with lower calorie diet (via better food choices and also portion control or program like weight watchers) and exercise building up to or more than 30 minutes 5 days per week including some aerobic activity    

## 2014-06-15 NOTE — Assessment & Plan Note (Signed)
Lab Results  Component Value Date   HGBA1C 6.7* 06/07/2014   Overall fairly stable  Rev low glycemic diet/ exercise and need for wt loss  No change in medicines  F/u in 6 mo

## 2014-06-15 NOTE — Assessment & Plan Note (Signed)
Disc goals for lipids and reasons to control them Rev labs with pt Rev low sat fat diet in detail Overall stable  Continue lipitor and zetia  F/u 6 mo

## 2014-06-19 ENCOUNTER — Encounter: Payer: BC Managed Care – PPO | Admitting: Family Medicine

## 2014-06-26 ENCOUNTER — Other Ambulatory Visit: Payer: Self-pay | Admitting: Family Medicine

## 2014-06-27 ENCOUNTER — Encounter: Payer: Self-pay | Admitting: Family Medicine

## 2014-06-27 ENCOUNTER — Ambulatory Visit (INDEPENDENT_AMBULATORY_CARE_PROVIDER_SITE_OTHER): Payer: BC Managed Care – PPO | Admitting: Family Medicine

## 2014-06-27 VITALS — BP 122/87 | HR 83 | Ht 64.0 in | Wt 216.0 lb

## 2014-06-27 DIAGNOSIS — N920 Excessive and frequent menstruation with regular cycle: Secondary | ICD-10-CM

## 2014-06-27 DIAGNOSIS — B373 Candidiasis of vulva and vagina: Secondary | ICD-10-CM | POA: Diagnosis not present

## 2014-06-27 DIAGNOSIS — B3731 Acute candidiasis of vulva and vagina: Secondary | ICD-10-CM

## 2014-06-27 DIAGNOSIS — N921 Excessive and frequent menstruation with irregular cycle: Secondary | ICD-10-CM

## 2014-06-27 DIAGNOSIS — Z124 Encounter for screening for malignant neoplasm of cervix: Secondary | ICD-10-CM | POA: Diagnosis not present

## 2014-06-27 DIAGNOSIS — Z1151 Encounter for screening for human papillomavirus (HPV): Secondary | ICD-10-CM

## 2014-06-27 MED ORDER — FLUCONAZOLE 100 MG PO TABS: 100.0000 mg | ORAL_TABLET | Freq: Every day | ORAL | Status: DC

## 2014-06-27 NOTE — Patient Instructions (Addendum)
Menorrhagia Menorrhagia is the medical term for when your menstrual periods are heavy or last longer than usual. With menorrhagia, every period you have may cause enough blood loss and cramping that you are unable to maintain your usual activities. CAUSES  In some cases, the cause of heavy periods is unknown, but a number of conditions may cause menorrhagia. Common causes include:  A problem with the hormone-producing thyroid gland (hypothyroid).  Noncancerous growths in the uterus (polyps or fibroids).  An imbalance of the estrogen and progesterone hormones.  One of your ovaries not releasing an egg during one or more months.  Side effects of having an intrauterine device (IUD).  Side effects of some medicines, such as anti-inflammatory medicines or blood thinners.  A bleeding disorder that stops your blood from clotting normally. SIGNS AND SYMPTOMS  During a normal period, bleeding lasts between 4 and 8 days. Signs that your periods are too heavy include:  You routinely have to change your pad or tampon every 1 or 2 hours because it is completely soaked.  You pass blood clots larger than 1 inch (2.5 cm) in size.  You have bleeding for more than 7 days.  You need to use pads and tampons at the same time because of heavy bleeding.  You need to wake up to change your pads or tampons during the night.  You have symptoms of anemia, such as tiredness, fatigue, or shortness of breath. DIAGNOSIS  Your health care provider will perform a physical exam and ask you questions about your symptoms and menstrual history. Other tests may be ordered based on what the health care provider finds during the exam. These tests can include:  Blood tests. Blood tests are used to check if you are pregnant or have hormonal changes, a bleeding or thyroid disorder, low iron levels (anemia), or other problems.  Endometrial biopsy. Your health care provider takes a sample of tissue from the inside of your  uterus to be examined under a microscope.  Pelvic ultrasound. This test uses sound waves to make a picture of your uterus, ovaries, and vagina. The pictures can show if you have fibroids or other growths.  Hysteroscopy. For this test, your health care provider will use a small telescope to look inside your uterus. Based on the results of your initial tests, your health care provider may recommend further testing. TREATMENT  Treatment may not be needed. If it is needed, your health care provider may recommend treatment with one or more medicines first. If these do not reduce bleeding enough, a surgical treatment might be an option. The best treatment for you will depend on:   Whether you need to prevent pregnancy.  Your desire to have children in the future.  The cause and severity of your bleeding.  Your opinion and personal preference.  Medicines for menorrhagia may include:  Birth control methods that use hormones. These include the pill, skin patch, vaginal ring, shots that you get every 3 months, hormonal IUD, and implant. These treatments reduce bleeding during your menstrual period.  Medicines that thicken blood and slow bleeding.  Medicines that reduce swelling, such as ibuprofen.  Medicines that contain a synthetic hormone called progestin.   Medicines that make the ovaries stop working for a short time.  You may need surgical treatment for menorrhagia if the medicines are unsuccessful. Treatment options include:  Dilation and curettage (D&C). In this procedure, your health care provider opens (dilates) your cervix and then scrapes or suctions tissue from   the lining of your uterus to reduce menstrual bleeding.  Operative hysteroscopy. This procedure uses a tiny tube with a light (hysteroscope) to view your uterine cavity and can help in the surgical removal of a polyp that may be causing heavy periods.  Endometrial ablation. Through various techniques, your health care  provider permanently destroys the entire lining of your uterus (endometrium). After endometrial ablation, most women have little or no menstrual flow. Endometrial ablation reduces your ability to become pregnant.  Endometrial resection. This surgical procedure uses an electrosurgical wire loop to remove the lining of the uterus. This procedure also reduces your ability to become pregnant.  Hysterectomy. Surgical removal of the uterus and cervix is a permanent procedure that stops menstrual periods. Pregnancy is not possible after a hysterectomy. This procedure requires anesthesia and hospitalization. HOME CARE INSTRUCTIONS   Only take over-the-counter or prescription medicines as directed by your health care provider. Take prescribed medicines exactly as directed. Do not change or switch medicines without consulting your health care provider.  Take any prescribed iron pills exactly as directed by your health care provider. Long-term heavy bleeding may result in low iron levels. Iron pills help replace the iron your body lost from heavy bleeding. Iron may cause constipation. If this becomes a problem, increase the bran, fruits, and roughage in your diet.  Do not take aspirin or medicines that contain aspirin 1 week before or during your menstrual period. Aspirin may make the bleeding worse.  If you need to change your sanitary pad or tampon more than once every 2 hours, stay in bed and rest as much as possible until the bleeding stops.  Eat well-balanced meals. Eat foods high in iron. Examples are leafy green vegetables, meat, liver, eggs, and whole grain breads and cereals. Do not try to lose weight until the abnormal bleeding has stopped and your blood iron level is back to normal. SEEK MEDICAL CARE IF:   You soak through a pad or tampon every 1 or 2 hours, and this happens every time you have a period.  You need to use pads and tampons at the same time because you are bleeding so much.  You  need to change your pad or tampon during the night.  You have a period that lasts for more than 8 days.  You pass clots bigger than 1 inch wide.  You have irregular periods that happen more or less often than once a month.  You feel dizzy or faint.  You feel very weak or tired.  You feel short of breath or feel your heart is beating too fast when you exercise.  You have nausea and vomiting or diarrhea while you are taking your medicine.  You have any problems that may be related to the medicine you are taking. SEEK IMMEDIATE MEDICAL CARE IF:   You soak through 4 or more pads or tampons in 2 hours.  You have any bleeding while you are pregnant. MAKE SURE YOU:   Understand these instructions.  Will watch your condition.  Will get help right away if you are not doing well or get worse. Document Released: 09/15/2005 Document Revised: 09/20/2013 Document Reviewed: 03/06/2013 [facility] Patient Information 2015 [place], [place]. This information is not intended to replace advice given to you by your health care provider. Make sure you discuss any questions you have with your health care provider. Perimenopause Perimenopause is the time when your body begins to move into the menopause (no menstrual period for 12 straight months).  It is a natural process. Perimenopause can begin 2-8 years before the menopause and usually lasts for 1 year after the menopause. During this time, your ovaries may or may not produce an egg. The ovaries vary in their production of estrogen and progesterone hormones each month. This can cause irregular menstrual periods, difficulty getting pregnant, vaginal bleeding between periods, and uncomfortable symptoms. CAUSES  Irregular production of the ovarian hormones, estrogen and progesterone, and not ovulating every month.  Other causes include:  Tumor of the pituitary gland in the brain.  Medical disease that affects the ovaries.  Radiation  treatment.  Chemotherapy.  Unknown causes.  Heavy smoking and excessive alcohol intake can bring on perimenopause sooner. SIGNS AND SYMPTOMS   Hot flashes.  Night sweats.  Irregular menstrual periods.  Decreased sex drive.  Vaginal dryness.  Headaches.  Mood swings.  Depression.  Memory problems.  Irritability.  Tiredness.  Weight gain.  Trouble getting pregnant.  The beginning of losing bone cells (osteoporosis).  The beginning of hardening of the arteries (atherosclerosis). DIAGNOSIS  Your health care provider will make a diagnosis by analyzing your age, menstrual history, and symptoms. He or she will do a physical exam and note any changes in your body, especially your female organs. Female hormone tests may or may not be helpful depending on the amount of female hormones you produce and when you produce them. However, other hormone tests may be helpful to rule out other problems. TREATMENT  In some cases, no treatment is needed. The decision on whether treatment is necessary during the perimenopause should be made by you and your health care provider based on how the symptoms are affecting you and your lifestyle. Various treatments are available, such as:  Treating individual symptoms with a specific medicine for that symptom.  Herbal medicines that can help specific symptoms.  Counseling.  Group therapy. HOME CARE INSTRUCTIONS   Keep track of your menstrual periods (when they occur, how heavy they are, how long between periods, and how long they last) as well as your symptoms and when they started.  Only take over-the-counter or prescription medicines as directed by your health care provider.  Sleep and rest.  Exercise.  Eat a diet that contains calcium (good for your bones) and soy (acts like the estrogen hormone).  Do not smoke.  Avoid alcoholic beverages.  Take vitamin supplements as recommended by your health care provider. Taking vitamin E  may help in certain cases.  Take calcium and vitamin D supplements to help prevent bone loss.  Group therapy is sometimes helpful.  Acupuncture may help in some cases. SEEK MEDICAL CARE IF:   You have questions about any symptoms you are having.  You need a referral to a specialist (gynecologist, psychiatrist, or psychologist). SEEK IMMEDIATE MEDICAL CARE IF:   You have vaginal bleeding.  Your period lasts longer than 8 days.  Your periods are recurring sooner than 21 days.  You have bleeding after intercourse.  You have severe depression.  You have pain when you urinate.  You have severe headaches.  You have vision problems. Document Released: 10/23/2004 Document Revised: 07/06/2013 Document Reviewed: 04/14/2013 [facility] Patient Information 2015 [place], [place]. This information is not intended to replace advice given to you by your health care provider. Make sure you discuss any questions you have with your health care provider. ENDOMETRIAL BIOPSY POST-PROCEDURE INSTRUCTIONS  1. You may take Ibuprofen, Aleve or Tylenol for pain if needed.  Cramping should resolve within in 24 hours.  2. You may have a small amount of spotting.  You should wear a mini pad for the next few days.  3. You may have intercourse after 24 hours.  4. You need to call if you have any pelvic pain, fever, heavy bleeding or foul smelling vaginal discharge.  5. Shower or bathe as normal  6. We will call you within one week with results or we will discuss the results at your follow-up appointment if needed.

## 2014-06-27 NOTE — Progress Notes (Signed)
   Subjective:    Patient ID: Stacy Monroe is a 48 y.o. female presenting with Vaginal Bleeding  on 06/27/2014  HPI: Needs pap and having peri-menopause.  On July 3 has been bleeding with heavy menses and prolonged menses with spotting. Prior to that she had regular cycles. + hot flashes x 5 years.  Has a yeast infection. Complains of low back pain with radiation into her flank.  Review of Systems  Constitutional: Negative for fever and chills.       Hot flashes, night sweats, insomnia  Respiratory: Negative for shortness of breath.   Cardiovascular: Positive for chest pain.  Gastrointestinal: Positive for diarrhea. Negative for nausea and vomiting.  Endocrine: Negative for polyuria.  Genitourinary: Positive for urgency and vaginal discharge.  Skin: Negative for rash.  Neurological: Negative for headaches.      Objective:    BP 122/87  Pulse 83  Ht 5\' 4"  (1.626 m)  Wt 216 lb (97.977 kg)  BMI 37.06 kg/m2  LMP 03/31/2014 Physical Exam  Constitutional: She is oriented to person, place, and time. She appears well-developed and well-nourished. No distress.  HENT:  Head: Normocephalic and atraumatic.  Eyes: No scleral icterus.  Neck: Neck supple.  Cardiovascular: Normal rate.   Pulmonary/Chest: Effort normal. Right breast exhibits no inverted nipple, no mass and no nipple discharge. Left breast exhibits no inverted nipple, no mass and no nipple discharge.  Abdominal: Soft.  Genitourinary:  BUS normal, vagina is pink and rugated, cervix is nulliparous without lesion, uterus is 8 wks size and anteverted, no adnexal mass or tenderness.   Neurological: She is alert and oriented to person, place, and time.  Skin: Skin is warm and dry.  Psychiatric: She has a normal mood and affect.   Patient given informed consent, signed copy in the chart, time out was performed. Appropriate time out taken. . The patient was placed in the lithotomy position and the cervix brought into view with  sterile speculum.  Portio of cervix cleansed x 2 with betadine swabs.  A tenaculum was placed in the anterior lip of the cervix.  The uterus was sounded for depth of 10. A pipelle was introduced to into the uterus, suction created,  and an endometrial sample was obtained. All equipment was removed and accounted for.  The patient tolerated the procedure well.        Assessment & Plan:   Problem List Items Addressed This Visit     Unprioritized   Menorrhagia - Primary     Rule out hormonal, anatomical issues--treat as appropriate based on work-up    Relevant Orders      TSH      Follicle stimulating hormone      US Pelvis Complete      US Transvaginal Non-OB      MM DIGITAL SCREENING BILATERAL      CBC      Surgical pathology    Other Visit Diagnoses   Candidiasis of vulva and vagina        Relevant Medications       fluconazole (DIFLUCAN) tablet 100 mg    Other Relevant Orders       Cytology - PAP    Screening for malignant neoplasm of the cervix           See AVS  Return in about 4 weeks (around 07/25/2014) for a follow-up.

## 2014-06-27 NOTE — Assessment & Plan Note (Signed)
Rule out hormonal, anatomical issues--treat as appropriate based on work-up

## 2014-06-27 NOTE — Addendum Note (Signed)
Addended by: Donnamae Jude on: 06/27/2014 04:30 PM   Modules accepted: Level of Service

## 2014-06-28 ENCOUNTER — Encounter: Payer: Self-pay | Admitting: Family Medicine

## 2014-06-28 LAB — CBC
HEMATOCRIT: 41.8 % (ref 36.0–46.0)
Hemoglobin: 14.1 g/dL (ref 12.0–15.0)
MCH: 29.1 pg (ref 26.0–34.0)
MCHC: 33.7 g/dL (ref 30.0–36.0)
MCV: 86.4 fL (ref 78.0–100.0)
PLATELETS: 316 10*3/uL (ref 150–400)
RBC: 4.84 MIL/uL (ref 3.87–5.11)
RDW: 14.4 % (ref 11.5–15.5)
WBC: 9.1 10*3/uL (ref 4.0–10.5)

## 2014-06-28 LAB — FOLLICLE STIMULATING HORMONE: FSH: 5.3 m[IU]/mL

## 2014-06-28 LAB — TSH: TSH: 1.984 u[IU]/mL (ref 0.350–4.500)

## 2014-06-30 ENCOUNTER — Telehealth: Payer: Self-pay | Admitting: Family Medicine

## 2014-06-30 LAB — CYTOLOGY - PAP

## 2014-06-30 MED ORDER — FLUCONAZOLE 150 MG PO TABS: 150.0000 mg | ORAL_TABLET | Freq: Once | ORAL | Status: DC

## 2014-06-30 NOTE — Telephone Encounter (Signed)
+   Yeast infection

## 2014-07-02 ENCOUNTER — Encounter: Payer: Self-pay | Admitting: Family Medicine

## 2014-07-04 ENCOUNTER — Ambulatory Visit (HOSPITAL_COMMUNITY)
Admission: RE | Admit: 2014-07-04 | Discharge: 2014-07-04 | Disposition: A | Discharge status: Home / Self Care | Payer: BC Managed Care – PPO | Source: Ambulatory Visit | Attending: Family Medicine | Admitting: Family Medicine

## 2014-07-04 DIAGNOSIS — D251 Intramural leiomyoma of uterus: Secondary | ICD-10-CM | POA: Diagnosis not present

## 2014-07-04 DIAGNOSIS — N926 Irregular menstruation, unspecified: Secondary | ICD-10-CM | POA: Diagnosis not present

## 2014-07-04 DIAGNOSIS — N920 Excessive and frequent menstruation with regular cycle: Secondary | ICD-10-CM | POA: Insufficient documentation

## 2014-07-04 DIAGNOSIS — N921 Excessive and frequent menstruation with irregular cycle: Secondary | ICD-10-CM

## 2014-07-18 ENCOUNTER — Ambulatory Visit (INDEPENDENT_AMBULATORY_CARE_PROVIDER_SITE_OTHER): Payer: BC Managed Care – PPO | Admitting: Family Medicine

## 2014-07-18 ENCOUNTER — Encounter: Payer: Self-pay | Admitting: Family Medicine

## 2014-07-18 VITALS — BP 131/83 | HR 81 | Ht 64.0 in | Wt 222.0 lb

## 2014-07-18 DIAGNOSIS — N921 Excessive and frequent menstruation with irregular cycle: Secondary | ICD-10-CM | POA: Diagnosis not present

## 2014-07-18 NOTE — Assessment & Plan Note (Signed)
All treatment options reviewed, including watchful waiting, po Progesterone with cycles, IUD (Mirena) and D and C with hysteroscopy, removal of polyp and possible endometrial ablation.  Additionally, we discussed hysterectomy--do not think she is a great candidate for this. She was given oral and written information. She will review with family, consider risks and benefits and then let me know her desires.  She is not favoring hysterectomy or oral medications at this time.

## 2014-07-18 NOTE — Patient Instructions (Signed)
Hydrothermablation Endometrial Ablation Endometrial ablation removes the lining of the uterus (endometrium). It is usually a same-day, outpatient treatment. Ablation helps avoid major surgery, such as surgery to remove the cervix and uterus (hysterectomy). After endometrial ablation, you will have little or no menstrual bleeding and may not be able to have children. However, if you are premenopausal, you will need to use a reliable method of birth control following the procedure because of the small chance that pregnancy can occur. There are different reasons to have this procedure, which include:  Heavy periods.  Bleeding that is causing anemia.  Irregular bleeding.  Bleeding fibroids on the lining inside the uterus if they are smaller than 3 centimeters. This procedure should not be done if:  You want children in the future.  You have severe cramps with your menstrual period.  You have precancerous or cancerous cells in your uterus.  You were recently pregnant.  You have gone through menopause.  You have had major surgery on the uterus, such as a cesarean delivery. LET [facility] CARE PROVIDER KNOW ABOUT:  Any allergies you have.  All medicines you are taking, including vitamins, herbs, eye drops, creams, and over-the-counter medicines.  Previous problems you or members of your family have had with the use of anesthetics.  Any blood disorders you have.  Previous surgeries you have had.  Medical conditions you have. RISKS AND COMPLICATIONS  Generally, this is a safe procedure. However, as with any procedure, complications can occur. Possible complications include:  Perforation of the uterus.  Bleeding.  Infection of the uterus, bladder, or vagina.  Injury to surrounding organs.  An air bubble to the lung (air embolus).  Pregnancy following the procedure.  Failure of the procedure to help the problem, requiring hysterectomy.  Decreased ability to diagnose  cancer in the lining of the uterus. BEFORE THE PROCEDURE  The lining of the uterus must be tested to make sure there is no pre-cancerous or cancer cells present.  An ultrasound may be performed to look at the size of the uterus and to check for abnormalities.  Medicines may be given to thin the lining of the uterus. PROCEDURE  During the procedure, your health care provider will use a tool called a resectoscope to help see inside your uterus. There are different ways to remove the lining of your uterus.   Radiofrequency - This method uses a radiofrequency-alternating electric current to remove the lining of the uterus.  Cryotherapy - This method uses extreme cold to freeze the lining of the uterus.  Heated-Free Liquid - This method uses heated salt (saline) solution to remove the lining of the uterus.  Microwave - This method uses high-energy microwaves to heat up the lining of the uterus to remove it.  Thermal balloon - This method involves inserting a catheter with a balloon tip into the uterus. The balloon tip is filled with heated fluid to remove the lining of the uterus. AFTER THE PROCEDURE  After your procedure, do not have sexual intercourse or insert anything into your vagina until permitted by your health care provider. After the procedure, you may experience:  Cramps.  Vaginal discharge.  Frequent urination. Document Released: 07/25/2004 Document Revised: 05/18/2013 Document Reviewed: 02/16/2013 [facility] Patient Information 2015 [place], [place]. This information is not intended to replace advice given to you by your health care provider. Make sure you discuss any questions you have with your health care provider. Levonorgestrel intrauterine device (IUD) What is this medicine? LEVONORGESTREL IUD (LEE voe  nor jes trel) is a contraceptive (birth control) device. The device is placed inside the uterus by a healthcare professional. It is used to prevent pregnancy and can also be  used to treat heavy bleeding that occurs during your period. Depending on the device, it can be used for 3 to 5 years. This medicine may be used for other purposes; ask your health care provider or pharmacist if you have questions. COMMON BRAND NAME(S): Verda Cumins What should I tell my health care provider before I take this medicine? They need to know if you have any of these conditions: -abnormal Pap smear -cancer of the breast, uterus, or cervix -diabetes -endometritis -genital or pelvic infection now or in the past -have more than one sexual partner or your partner has more than one partner -heart disease -history of an ectopic or tubal pregnancy -immune system problems -IUD in place -liver disease or tumor -problems with blood clots or take blood-thinners -use intravenous drugs -uterus of unusual shape -vaginal bleeding that has not been explained -an unusual or allergic reaction to levonorgestrel, other hormones, silicone, or polyethylene, medicines, foods, dyes, or preservatives -pregnant or trying to get pregnant -breast-feeding How should I use this medicine? This device is placed inside the uterus by a health care professional. Talk to your pediatrician regarding the use of this medicine in children. Special care may be needed. Overdosage: If you think you have taken too much of this medicine contact a poison control center or emergency room at once. NOTE: This medicine is only for you. Do not share this medicine with others. What if I miss a dose? This does not apply. What may interact with this medicine? Do not take this medicine with any of the following medications: -amprenavir -bosentan -fosamprenavir This medicine may also interact with the following medications: -aprepitant -barbiturate medicines for inducing sleep or treating seizures -bexarotene -griseofulvin -medicines to treat seizures like carbamazepine, ethotoin, felbamate, oxcarbazepine,  phenytoin, topiramate -modafinil -pioglitazone -rifabutin -rifampin -rifapentine -some medicines to treat HIV infection like atazanavir, indinavir, lopinavir, nelfinavir, tipranavir, ritonavir -St. John's wort -warfarin This list may not describe all possible interactions. Give your health care provider a list of all the medicines, herbs, non-prescription drugs, or dietary supplements you use. Also tell them if you smoke, drink alcohol, or use illegal drugs. Some items may interact with your medicine. What should I watch for while using this medicine? Visit your doctor or health care professional for regular check ups. See your doctor if you or your partner has sexual contact with others, becomes HIV positive, or gets a sexual transmitted disease. This product does not protect you against HIV infection (AIDS) or other sexually transmitted diseases. You can check the placement of the IUD yourself by reaching up to the top of your vagina with clean fingers to feel the threads. Do not pull on the threads. It is a good habit to check placement after each menstrual period. Call your doctor right away if you feel more of the IUD than just the threads or if you cannot feel the threads at all. The IUD may come out by itself. You may become pregnant if the device comes out. If you notice that the IUD has come out use a backup birth control method like condoms and call your health care provider. Using tampons will not change the position of the IUD and are okay to use during your period. What side effects may I notice from receiving this medicine? Side effects that you should  report to your doctor or health care professional as soon as possible: -allergic reactions like skin rash, itching or hives, swelling of the face, lips, or tongue -fever, flu-like symptoms -genital sores -high blood pressure -no menstrual period for 6 weeks during use -pain, swelling, warmth in the leg -pelvic pain or  tenderness -severe or sudden headache -signs of pregnancy -stomach cramping -sudden shortness of breath -trouble with balance, talking, or walking -unusual vaginal bleeding, discharge -yellowing of the eyes or skin Side effects that usually do not require medical attention (report to your doctor or health care professional if they continue or are bothersome): -acne -breast pain -change in sex drive or performance -changes in weight -cramping, dizziness, or faintness while the device is being inserted -headache -irregular menstrual bleeding within first 3 to 6 months of use -nausea This list may not describe all possible side effects. Call your doctor for medical advice about side effects. You may report side effects to FDA at 1-800-FDA-1088. Where should I keep my medicine? This does not apply. NOTE: This sheet is a summary. It may not cover all possible information. If you have questions about this medicine, talk to your doctor, pharmacist, or health care provider.  2015, [employer]vier/Gold Standard. (2011-10-16 13:54:04)

## 2014-07-18 NOTE — Progress Notes (Signed)
    Subjective:    Patient ID: Stacy Monroe is a 48 y.o. female presenting with Follow-up  on 07/18/2014  HPI: Here for f/u results.  Seen for heavy menstrual bleeding.  W/u included nml TSH, FSH, U/S which showed a 1 cm endometrial stripe, few sub-centimeter fibroids, normal pap and EMB which showed benign polyp, no hyperplasia or carcinoma. All of this was reviewed with the patient.  Review of Systems  Constitutional: Negative for fever and chills.  Respiratory: Negative for shortness of breath.   Cardiovascular: Negative for chest pain.  Gastrointestinal: Negative for nausea, vomiting and abdominal pain.  Genitourinary: Positive for vaginal bleeding and vaginal discharge (chronic yeast infection). Negative for dysuria.  Skin: Negative for rash.      Objective:    BP 131/83  Pulse 81  Ht 5\' 4"  (1.626 m)  Wt 222 lb (100.699 kg)  BMI 38.09 kg/m2  LMP 07/04/2014 Physical Exam  Constitutional: She is oriented to person, place, and time. She appears well-developed and well-nourished. No distress.  HENT:  Head: Normocephalic and atraumatic.  Eyes: No scleral icterus.  Neck: Neck supple.  Cardiovascular: Normal rate.   Pulmonary/Chest: Effort normal.  Abdominal: Soft.  Neurological: She is alert and oriented to person, place, and time.  Skin: Skin is warm and dry.  Psychiatric: She has a normal mood and affect.        Assessment & Plan:   Problem List Items Addressed This Visit     Unprioritized   Menorrhagia - Primary     All treatment options reviewed, including watchful waiting, po Progesterone with cycles, IUD (Mirena) and D and C with hysteroscopy, removal of polyp and possible endometrial ablation.  Additionally, we discussed hysterectomy--do not think she is a great candidate for this. She was given oral and written information. She will review with family, consider risks and benefits and then let me know her desires.  She is not favoring hysterectomy or oral  medications at this time.       Return in about 3 months (around 10/18/2014) for a follow-up.

## 2014-07-22 ENCOUNTER — Other Ambulatory Visit: Payer: Self-pay | Admitting: Family Medicine

## 2014-07-31 ENCOUNTER — Encounter: Payer: Self-pay | Admitting: Family Medicine

## 2014-08-26 ENCOUNTER — Other Ambulatory Visit: Payer: Self-pay | Admitting: Family Medicine

## 2014-11-04 ENCOUNTER — Other Ambulatory Visit: Payer: Self-pay | Admitting: Family Medicine

## 2014-11-08 ENCOUNTER — Other Ambulatory Visit: Payer: Self-pay | Admitting: Family Medicine

## 2014-12-13 ENCOUNTER — Other Ambulatory Visit: Payer: Self-pay | Admitting: Family Medicine

## 2015-01-02 ENCOUNTER — Telehealth: Payer: Self-pay | Admitting: Family Medicine

## 2015-01-02 DIAGNOSIS — Z0000 Encounter for general adult medical examination without abnormal findings: Secondary | ICD-10-CM

## 2015-01-02 DIAGNOSIS — E559 Vitamin D deficiency, unspecified: Secondary | ICD-10-CM

## 2015-01-02 DIAGNOSIS — E119 Type 2 diabetes mellitus without complications: Secondary | ICD-10-CM

## 2015-01-02 NOTE — Telephone Encounter (Signed)
-----   Message from Ellamae Sia sent at 01/01/2015  3:24 PM EDT ----- Regarding: Lab orders for Wednesday, 4.6.16 Patient is scheduled for CPX labs, please order future labs, Thanks , Karna Christmas

## 2015-01-03 ENCOUNTER — Other Ambulatory Visit (INDEPENDENT_AMBULATORY_CARE_PROVIDER_SITE_OTHER): Payer: BC Managed Care – PPO

## 2015-01-03 DIAGNOSIS — Z0000 Encounter for general adult medical examination without abnormal findings: Secondary | ICD-10-CM | POA: Diagnosis not present

## 2015-01-03 DIAGNOSIS — E559 Vitamin D deficiency, unspecified: Secondary | ICD-10-CM

## 2015-01-03 DIAGNOSIS — E119 Type 2 diabetes mellitus without complications: Secondary | ICD-10-CM

## 2015-01-03 LAB — COMPREHENSIVE METABOLIC PANEL
ALT: 39 U/L — AB (ref 0–35)
AST: 29 U/L (ref 0–37)
Albumin: 4.2 g/dL (ref 3.5–5.2)
Alkaline Phosphatase: 52 U/L (ref 39–117)
BILIRUBIN TOTAL: 0.4 mg/dL (ref 0.2–1.2)
BUN: 10 mg/dL (ref 6–23)
CO2: 28 mEq/L (ref 19–32)
CREATININE: 0.68 mg/dL (ref 0.40–1.20)
Calcium: 9.2 mg/dL (ref 8.4–10.5)
Chloride: 102 mEq/L (ref 96–112)
GFR: 96.96 mL/min (ref >60.00)
Glucose, Bld: 121 mg/dL — ABNORMAL HIGH (ref 70–99)
Potassium: 4.2 mEq/L (ref 3.5–5.1)
SODIUM: 136 meq/L (ref 135–145)
Total Protein: 7 g/dL (ref 6.0–8.3)

## 2015-01-03 LAB — CBC WITH DIFFERENTIAL/PLATELET
BASOS PCT: 0.6 % (ref 0.0–3.0)
Basophils Absolute: 0 10*3/uL (ref 0.0–0.1)
EOS ABS: 0.2 10*3/uL (ref 0.0–0.7)
Eosinophils Relative: 2.3 % (ref 0.0–5.0)
HCT: 39.9 % (ref 36.0–46.0)
Hemoglobin: 13.6 g/dL (ref 12.0–15.0)
Lymphocytes Relative: 26.9 % (ref 12.0–46.0)
Lymphs Abs: 2.2 10*3/uL (ref 0.7–4.0)
MCHC: 34.1 g/dL (ref 30.0–36.0)
MCV: 88.6 fl (ref 78.0–100.0)
MONO ABS: 0.7 10*3/uL (ref 0.1–1.0)
Monocytes Relative: 7.8 % (ref 3.0–12.0)
Neutro Abs: 5.2 10*3/uL (ref 1.4–7.7)
Neutrophils Relative %: 62.4 % (ref 43.0–77.0)
PLATELETS: 298 10*3/uL (ref 150.0–400.0)
RBC: 4.5 Mil/uL (ref 3.87–5.11)
RDW: 13.9 % (ref 11.5–15.5)
WBC: 8.4 10*3/uL (ref 4.0–10.5)

## 2015-01-03 LAB — LIPID PANEL
CHOL/HDL RATIO: 3
Cholesterol: 123 mg/dL (ref 0–200)
HDL: 37.4 mg/dL — ABNORMAL LOW (ref >39.00)
LDL Cholesterol: 57 mg/dL (ref 0–99)
NonHDL: 85.6
TRIGLYCERIDES: 145 mg/dL (ref 0.0–149.0)
VLDL: 29 mg/dL (ref 0.0–40.0)

## 2015-01-03 LAB — VITAMIN D 25 HYDROXY (VIT D DEFICIENCY, FRACTURES): VITD: 21.92 ng/mL — ABNORMAL LOW (ref 30.00–100.00)

## 2015-01-03 LAB — TSH: TSH: 1.83 u[IU]/mL (ref 0.35–4.50)

## 2015-01-03 LAB — HEMOGLOBIN A1C: HEMOGLOBIN A1C: 7.3 % — AB (ref 4.6–6.5)

## 2015-01-10 ENCOUNTER — Encounter: Payer: Self-pay | Admitting: Family Medicine

## 2015-01-10 ENCOUNTER — Ambulatory Visit (INDEPENDENT_AMBULATORY_CARE_PROVIDER_SITE_OTHER): Payer: BC Managed Care – PPO | Admitting: Family Medicine

## 2015-01-10 VITALS — BP 114/72 | HR 82 | Temp 98.2°F | Ht 64.0 in | Wt 221.0 lb

## 2015-01-10 DIAGNOSIS — I10 Essential (primary) hypertension: Secondary | ICD-10-CM | POA: Diagnosis not present

## 2015-01-10 DIAGNOSIS — E669 Obesity, unspecified: Secondary | ICD-10-CM

## 2015-01-10 DIAGNOSIS — E559 Vitamin D deficiency, unspecified: Secondary | ICD-10-CM

## 2015-01-10 DIAGNOSIS — E785 Hyperlipidemia, unspecified: Secondary | ICD-10-CM | POA: Diagnosis not present

## 2015-01-10 DIAGNOSIS — E119 Type 2 diabetes mellitus without complications: Secondary | ICD-10-CM

## 2015-01-10 DIAGNOSIS — Z1211 Encounter for screening for malignant neoplasm of colon: Secondary | ICD-10-CM | POA: Insufficient documentation

## 2015-01-10 DIAGNOSIS — Z0000 Encounter for general adult medical examination without abnormal findings: Secondary | ICD-10-CM

## 2015-01-10 NOTE — Progress Notes (Signed)
Subjective:    Patient ID: Stacy Monroe, female    DOB: [birth year], 49 y.o.   MRN: [id number]  HPI Here for health maintenance exam and to review chronic medical problems    Overall doing pretty well   L foot - has exp some "coolness" to it - like standing in water - just the one foot  ? Poss neuropathy  On and off - occasionally   Knot on L index finger   Wt is down 1 lb  bmi of 37- obese  She does not "eat a lot" When she does eat - may eat too much -  This is usually schedule related  Is not exercising   Hep C/HIV screen Declines - not high risk   Pneumonia vaccine- declines   opthy exam-last march - is planning to get an appt   Mammogram - has not had in the past year - did not get scheduled with Dr Kennon Rounds - wants to go to [facility]  Will schedule that at [facility]  Self exam- no lumps or changes  No lumps on exam from Dr Kennon Rounds   Colon cancer screening- no cancer in family  Thinks she wants to start process of colonoscopy referral - would like to go to [place] in [place]    Flu shot-declines  Pap 9/15 nl - Dr Kennon Rounds  Periods  are better - stopped bleeding constantly  Saw gyn Is always hot - ? If any hot flashes - is warmer at night especially   Td 5/11  bp is stable today  No cp or palpitations or headaches or edema  No side effects to medicines  BP Readings from Last 3 Encounters:  01/10/15 114/72  07/18/14 131/83  06/27/14 122/87     DM2 Lab Results  Component Value Date   HGBA1C 7.3* 01/03/2015  last A1C was 6.7  She is somewhat surprised by that  Has met with a nutritionist in the past  Blood sugars are higher in the am -- not terribly consistent  Does her lantus at night  Knows she needs to exercise   Vit d is 21 She takes a multivitamin    hyperlipdemia Lab Results  Component Value Date   CHOL 123 01/03/2015   CHOL 138 06/07/2014   CHOL 150 03/30/2013   Lab Results  Component Value Date   HDL 37.40* 01/03/2015   HDL 34.80* 06/07/2014     HDL 41.10 03/30/2013   Lab Results  Component Value Date   LDLCALC 57 01/03/2015   LDLCALC 73 06/07/2014   LDLCALC 71 03/30/2013   Lab Results  Component Value Date   TRIG 145.0 01/03/2015   TRIG 153.0* 06/07/2014   TRIG 191.0* 03/30/2013   Lab Results  Component Value Date   CHOLHDL 3 01/03/2015   CHOLHDL 4 06/07/2014   CHOLHDL 4 03/30/2013   Lab Results  Component Value Date   LDLDIRECT 136.4 10/17/2011   LDLDIRECT 148.4 01/17/2011   LDLDIRECT 182.9 09/27/2010    Cholesterol is stable and well controlled   Lab Results  Component Value Date   WBC 8.4 01/03/2015   HGB 13.6 01/03/2015   HCT 39.9 01/03/2015   MCV 88.6 01/03/2015   PLT 298.0 01/03/2015      Chemistry      Component Value Date/Time   NA 136 01/03/2015 1107   K 4.2 01/03/2015 1107   CL 102 01/03/2015 1107   CO2 28 01/03/2015 1107   BUN 10 01/03/2015 1107  CREATININE 0.68 01/03/2015 1107      Component Value Date/Time   CALCIUM 9.2 01/03/2015 1107   ALKPHOS 52 01/03/2015 1107   AST 29 01/03/2015 1107   ALT 39* 01/03/2015 1107   BILITOT 0.4 01/03/2015 1107      Lab Results  Component Value Date   TSH 1.83 01/03/2015     Review of Systems     Objective:   Physical Exam        Assessment & Plan:

## 2015-01-10 NOTE — Patient Instructions (Addendum)
Don't forget to schedule your annual mammogram and also eye exam  I will do a referral for GI for colonoscopy  Get vitamin D3 over the counter and take 2000 iu daily  Think about starting exercise -goal to work up to 30 minutes 5 days per week for weight loss and better health and diabetes control  Also diabetic diet - try to stay on track with regular meals  Follow up in 3 months with labs prior

## 2015-01-10 NOTE — Progress Notes (Signed)
Pre visit review using our clinic review tool, if applicable. No additional management support is needed unless otherwise documented below in the visit note. 

## 2015-01-11 NOTE — Assessment & Plan Note (Signed)
Ref for first screening colonoscopy  

## 2015-01-11 NOTE — Assessment & Plan Note (Signed)
Stable with statin and diet  Disc goals for lipids and reasons to control them Rev labs with pt Rev low sat fat diet in detail

## 2015-01-11 NOTE — Assessment & Plan Note (Signed)
Lab Results  Component Value Date   HGBA1C 7.3* 01/03/2015   This has increased  Rev low glycemic diet  Needs to exercise -does not have time for self care Will continue current meds and re check 3 mo after improved diet  Disc need for wt loss - pt does not think she has the time to invest in exercise / ? If lack of motivation  Foot exam today She will schedule her own eye exam

## 2015-01-11 NOTE — Assessment & Plan Note (Signed)
Discussed how this problem influences overall health and the risks it imposes  Reviewed plan for weight loss with lower calorie diet (via better food choices and also portion control or program like weight watchers) and exercise building up to or more than 30 minutes 5 days per week including some aerobic activity    

## 2015-01-11 NOTE — Assessment & Plan Note (Signed)
bp in fair control at this time  BP Readings from Last 1 Encounters:  01/10/15 114/72   No changes needed Disc lifstyle change with low sodium diet and exercise  Labs reviewed

## 2015-01-11 NOTE — Assessment & Plan Note (Signed)
Low D Enc to take 2000 iu daily otc of D3 Disc imp to bone and overall health

## 2015-01-11 NOTE — Assessment & Plan Note (Signed)
Reviewed health habits including diet and exercise and skin cancer prevention Reviewed appropriate screening tests for age  Also reviewed health mt list, fam hx and immunization status , as well as social and family history   See HPI Labs rev Don't forget to schedule your annual mammogram and also eye exam  I will do a referral for GI for colonoscopy  Get vitamin D3 over the counter and take 2000 iu daily  Think about starting exercise -goal to work up to 30 minutes 5 days per week for weight loss and better health and diabetes control  Also diabetic diet - try to stay on track with regular meals  Follow up in 3 months with labs prior

## 2015-01-13 ENCOUNTER — Other Ambulatory Visit: Payer: Self-pay | Admitting: Family Medicine

## 2015-01-15 ENCOUNTER — Other Ambulatory Visit: Payer: Self-pay | Admitting: *Deleted

## 2015-01-15 MED ORDER — LANSOPRAZOLE 15 MG PO CPDR
15.0000 mg | DELAYED_RELEASE_CAPSULE | Freq: Every day | ORAL | Status: DC
Start: 2015-01-15 — End: 2016-03-10

## 2015-04-09 ENCOUNTER — Telehealth: Payer: Self-pay | Admitting: Family Medicine

## 2015-04-09 DIAGNOSIS — E119 Type 2 diabetes mellitus without complications: Secondary | ICD-10-CM

## 2015-04-09 DIAGNOSIS — E559 Vitamin D deficiency, unspecified: Secondary | ICD-10-CM

## 2015-04-09 DIAGNOSIS — Z0000 Encounter for general adult medical examination without abnormal findings: Secondary | ICD-10-CM

## 2015-04-09 NOTE — Telephone Encounter (Signed)
-----   Message from Ellamae Sia sent at 04/04/2015  3:12 PM EDT ----- Regarding: Lab orders for Tuesday, 7.12.16 Patient is scheduled for CPX labs, please order future labs, Thanks , Karna Christmas

## 2015-04-10 ENCOUNTER — Other Ambulatory Visit (INDEPENDENT_AMBULATORY_CARE_PROVIDER_SITE_OTHER): Payer: BC Managed Care – PPO

## 2015-04-10 DIAGNOSIS — Z0000 Encounter for general adult medical examination without abnormal findings: Secondary | ICD-10-CM

## 2015-04-10 DIAGNOSIS — E559 Vitamin D deficiency, unspecified: Secondary | ICD-10-CM

## 2015-04-10 DIAGNOSIS — E119 Type 2 diabetes mellitus without complications: Secondary | ICD-10-CM

## 2015-04-10 LAB — COMPREHENSIVE METABOLIC PANEL
ALBUMIN: 4 g/dL (ref 3.5–5.2)
ALT: 39 U/L — ABNORMAL HIGH (ref 0–35)
AST: 29 U/L (ref 0–37)
Alkaline Phosphatase: 48 U/L (ref 39–117)
BUN: 13 mg/dL (ref 6–23)
CALCIUM: 9.1 mg/dL (ref 8.4–10.5)
CO2: 27 mEq/L (ref 19–32)
Chloride: 105 mEq/L (ref 96–112)
Creatinine, Ser: 0.75 mg/dL (ref 0.40–1.20)
GFR: 86.5 mL/min (ref >60.00)
GLUCOSE: 211 mg/dL — AB (ref 70–99)
Potassium: 4.9 mEq/L (ref 3.5–5.1)
SODIUM: 137 meq/L (ref 135–145)
TOTAL PROTEIN: 6.6 g/dL (ref 6.0–8.3)
Total Bilirubin: 0.3 mg/dL (ref 0.2–1.2)

## 2015-04-10 LAB — CBC WITH DIFFERENTIAL/PLATELET
Basophils Absolute: 0 10*3/uL (ref 0.0–0.1)
Basophils Relative: 0.6 % (ref 0.0–3.0)
EOS PCT: 2.3 % (ref 0.0–5.0)
Eosinophils Absolute: 0.2 10*3/uL (ref 0.0–0.7)
HCT: 38.9 % (ref 36.0–46.0)
HEMOGLOBIN: 12.7 g/dL (ref 12.0–15.0)
Lymphocytes Relative: 20 % (ref 12.0–46.0)
Lymphs Abs: 1.7 10*3/uL (ref 0.7–4.0)
MCHC: 32.7 g/dL (ref 30.0–36.0)
MCV: 90 fl (ref 78.0–100.0)
Monocytes Absolute: 0.6 10*3/uL (ref 0.1–1.0)
Monocytes Relative: 7.2 % (ref 3.0–12.0)
NEUTROS PCT: 69.9 % (ref 43.0–77.0)
Neutro Abs: 6 10*3/uL (ref 1.4–7.7)
PLATELETS: 252 10*3/uL (ref 150.0–400.0)
RBC: 4.32 Mil/uL (ref 3.87–5.11)
RDW: 13.6 % (ref 11.5–15.5)
WBC: 8.6 10*3/uL (ref 4.0–10.5)

## 2015-04-10 LAB — TSH: TSH: 1.44 u[IU]/mL (ref 0.35–4.50)

## 2015-04-10 LAB — LIPID PANEL
CHOLESTEROL: 121 mg/dL (ref 0–200)
HDL: 37.2 mg/dL — AB (ref >39.00)
LDL Cholesterol: 63 mg/dL (ref 0–99)
NonHDL: 83.8
Total CHOL/HDL Ratio: 3
Triglycerides: 102 mg/dL (ref 0.0–149.0)
VLDL: 20.4 mg/dL (ref 0.0–40.0)

## 2015-04-10 LAB — HEMOGLOBIN A1C: HEMOGLOBIN A1C: 7.5 % — AB (ref 4.6–6.5)

## 2015-04-10 LAB — VITAMIN D 25 HYDROXY (VIT D DEFICIENCY, FRACTURES): VITD: 27.53 ng/mL — ABNORMAL LOW (ref 30.00–100.00)

## 2015-04-13 ENCOUNTER — Encounter: Payer: Self-pay | Admitting: Family Medicine

## 2015-04-13 ENCOUNTER — Ambulatory Visit (INDEPENDENT_AMBULATORY_CARE_PROVIDER_SITE_OTHER): Payer: BC Managed Care – PPO | Admitting: Family Medicine

## 2015-04-13 VITALS — BP 112/60 | HR 68 | Temp 97.8°F | Wt 218.0 lb

## 2015-04-13 DIAGNOSIS — E119 Type 2 diabetes mellitus without complications: Secondary | ICD-10-CM

## 2015-04-13 DIAGNOSIS — I10 Essential (primary) hypertension: Secondary | ICD-10-CM

## 2015-04-13 DIAGNOSIS — E785 Hyperlipidemia, unspecified: Secondary | ICD-10-CM

## 2015-04-13 MED ORDER — INSULIN GLARGINE 100 UNIT/ML SOLOSTAR PEN: 70.0000 [IU] | PEN_INJECTOR | Freq: Every day | SUBCUTANEOUS | Status: DC

## 2015-04-13 NOTE — Progress Notes (Signed)
Pre visit review using our clinic review tool, if applicable. No additional management support is needed unless otherwise documented below in the visit note. 

## 2015-04-13 NOTE — Patient Instructions (Signed)
Keep working on [employer] exercise 30 minutes 5 days per week - build up to it  Make your eye doctor appt  Get back to the gyn about the yeast infections   Increase lantus to 60 units daily for a week  If no low glucose readings then increase to 70 units daily   Follow up with me in 3 months with labs prior

## 2015-04-13 NOTE — Progress Notes (Signed)
Subjective:    Patient ID: Stacy Monroe, female    DOB: 11/14/1965, 49 y.o.   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    Feels great overall   Wt is down 3 lb with bmi of 37 Moving more - trying   bp is stable today  No cp or palpitations or headaches or edema  No side effects to medicines  BP Readings from Last 3 Encounters:  04/13/15 112/60  01/10/15 114/72  07/18/14 131/83     Diabetes Home sugar results - has checked her glucose in ams - around 200 , evenings tend to be a bit lower 150s  occ lows - only if she skips a meal  DM diet - difficult to eat right - is on the run a lot and eating fast food, some protein shakes (she has had diabetic)  Not a lot of time for self care  Exercise - just moving more (more active lifestyle) - wants to walk - enjoys that  Symptoms- the numbness in fingers and toes has improved , has a lot of yeast infections - that is very frequent and not going away (manages with otc med)  A1C last  Lab Results  Component Value Date   HGBA1C 7.5* 04/10/2015   This is up from 7.3  No problems with medications -amaryl, lantus, metformin  (50 of lantus)  Renal protection ace  Last eye exam - needs to get an appointment   Constant yeast infection  Uses otc med  Last diflucan was in May or June and it did not help much    Lipids On lipitor and zetia Lab Results  Component Value Date   CHOL 121 04/10/2015   CHOL 123 01/03/2015   CHOL 138 06/07/2014   Lab Results  Component Value Date   HDL 37.20* 04/10/2015   HDL 37.40* 01/03/2015   HDL 34.80* 06/07/2014   Lab Results  Component Value Date   LDLCALC 63 04/10/2015   [place] 57 01/03/2015   [place] 73 06/07/2014   Lab Results  Component Value Date   TRIG 102.0 04/10/2015   TRIG 145.0 01/03/2015   TRIG 153.0* 06/07/2014   Lab Results  Component Value Date   CHOLHDL 3  04/10/2015   CHOLHDL 3 01/03/2015   CHOLHDL 4 06/07/2014   Lab Results  Component Value Date   LDLDIRECT 136.4 10/17/2011   LDLDIRECT 148.4 01/17/2011   LDLDIRECT 182.9 09/27/2010     decent control - want to get good cholesterol up   Patient Active Problem List   Diagnosis Date Noted  . Colon cancer screening 01/10/2015  . Routine general medical examination at a health care facility 01/02/2015  . Menorrhagia 06/14/2014  . Low back pain 11/25/2013  . Abnormal EKG 01/08/2012  . Chest pressure 12/04/2011  . Adjustment disorder with mixed anxiety and depressed mood 06/24/2011  . Elevated transaminase level 01/21/2011  . PLANTAR FASCIITIS 10/02/2010  . Vitamin D deficiency 04/26/2010  . Essential hypertension 02/15/2009  . Diabetes type 2, controlled 09/07/2008  . Hyperlipidemia 09/07/2008  . Obesity 04/24/2008  . COMMON MIGRAINE 04/18/2008   Past Medical History  Diagnosis Date  . Hypertension   . Hyperlipidemia   . Diabetes mellitus     type II controlled  . Palpitations   . Obesity   . Migraine  common  . Vertigo   . Plantar fasciitis   . S/P tonsillectomy    Past Surgical History  Procedure Laterality Date  . Bladder surgery  1990    bladder tack after 2nd delivery   History  Substance Use Topics  . Smoking status: Never Smoker   . Smokeless tobacco: Never Used  . Alcohol Use: No   Family History  Problem Relation Age of Onset  . Arthritis Mother     RA  . Hypertension Mother   . Drug abuse Mother   . Depression Mother   . Cancer Sister     thyroid cancer, reoccured 2008  . Depression Sister   . Drug abuse Brother   . Stroke Maternal Grandmother   . Alzheimer's disease Maternal Grandmother   . Depression Sister   . Depression Sister    Allergies  Allergen Reactions  . Shellfish Allergy    Current Outpatient Prescriptions on File Prior to Visit  Medication Sig Dispense Refill  . aspirin 81 MG tablet Take 81 mg by mouth daily.      Marland Kitchen  atorvastatin (LIPITOR) 20 MG tablet TAKE ONE TABLET BY MOUTH ONCE DAILY 90 tablet 3  . cetirizine (ZYRTEC) 10 MG tablet TAKE ONE TABLET BY MOUTH EVERY DAY 30 tablet 2  . citalopram (CELEXA) 40 MG tablet TAKE ONE TABLET BY MOUTH ONCE DAILY 90 tablet 3  . eletriptan (RELPAX) 40 MG tablet One tablet by mouth at onset of headache. May repeat in 2 hours if headache persists or recurs. 1 tablet by mouth at onset of headache, repeat in 2 hours if not resolved. 10 tablet 5  . glimepiride (AMARYL) 4 MG tablet TAKE ONE TABLET BY MOUTH ONCE DAILY WITH BREAKFAST 30 tablet 11  . glucose blood (ONE TOUCH ULTRA TEST) test strip USE TO TEST BLOOD SUGAR 3-4 TIMES A DAY AS NEEDED FOR DX. 250.00 100 each 5  . lansoprazole (PREVACID 24HR) 15 MG capsule Take 1 capsule (15 mg total) by mouth daily. 90 capsule 3  . lisinopril (PRINIVIL,ZESTRIL) 20 MG tablet TAKE ONE TABLET BY MOUTH ONCE DAILY 30 tablet 5  . metFORMIN (GLUCOPHAGE-XR) 500 MG 24 hr tablet TAKE TWO TABLETS BY MOUTH IN THE EVENING 180 tablet 3  . ZETIA 10 MG tablet TAKE ONE TABLET BY MOUTH ONCE DAILY 30 tablet 11  . ALPRAZolam (XANAX) 0.25 MG tablet Take 1 tablet (0.25 mg total) by mouth daily as needed for sleep. (Patient not taking: Reported on 01/10/2015) 30 tablet 0  . cyclobenzaprine (FLEXERIL) 10 MG tablet Take 1 tablet (10 mg total) by mouth 3 (three) times daily as needed for muscle spasms. (Patient not taking: Reported on 01/10/2015) 30 tablet 0  . fluconazole (DIFLUCAN) 100 MG tablet Take 1 tablet (100 mg total) by mouth daily. (Patient not taking: Reported on 01/10/2015) 1 tablet 0  . fluconazole (DIFLUCAN) 150 MG tablet Take 1 tablet (150 mg total) by mouth once. (Patient not taking: Reported on 01/10/2015) 1 tablet 0  . metoprolol succinate (TOPROL-XL) 25 MG 24 hr tablet Take 1 tablet (25 mg total) by mouth daily. (Patient not taking: Reported on 01/10/2015) 30 tablet 3  . [DISCONTINUED] ipratropium (ATROVENT) 0.03 % nasal spray Place 2 sprays into the  nose 3 (three) times daily. 30 mL 2   No current facility-administered medications on file prior to visit.    Review of Systems Review of Systems  Constitutional: Negative for fever, appetite change, fatigue and unexpected weight change.  Eyes: Negative for pain  and visual disturbance.  Respiratory: Negative for cough and shortness of breath.   Cardiovascular: Negative for cp or palpitations    Gastrointestinal: Negative for nausea, diarrhea and constipation.  Genitourinary: Negative for urgency and frequency. neg for excessive thirst  Skin: Negative for pallor or rash   Neurological: Negative for weakness, light-headedness, numbness and headaches.  Hematological: Negative for adenopathy. Does not bruise/bleed easily.  Psychiatric/Behavioral: Negative for dysphoric mood. The patient is not nervous/anxious.         Objective:   Physical Exam  Constitutional: She appears well-developed and well-nourished. No distress.  obese and well appearing   HENT:  Head: Normocephalic and atraumatic.  Mouth/Throat: Oropharynx is clear and moist.  Eyes: Conjunctivae and EOM are normal. Pupils are equal, round, and reactive to light.  Neck: Normal range of motion. Neck supple. No JVD present. Carotid bruit is not present. No thyromegaly present.  Cardiovascular: Normal rate, regular rhythm, normal heart sounds and intact distal pulses.  Exam reveals no gallop.   Pulmonary/Chest: Effort normal and breath sounds normal. No respiratory distress. She has no wheezes. She has no rales.  No crackles  Abdominal: Soft. Bowel sounds are normal. She exhibits no distension, no abdominal bruit and no mass. There is no tenderness.  Musculoskeletal: She exhibits no edema.  Lymphadenopathy:    She has no cervical adenopathy.  Neurological: She is alert. She has normal reflexes.  Skin: Skin is warm and dry. No rash noted.  Psychiatric: She has a normal mood and affect.          Assessment & Plan:    Problem List Items Addressed This Visit    Diabetes type 2, controlled    Lab Results  Component Value Date   HGBA1C 7.5* 04/10/2015   This is not at goal  Will inc exercise to 5 d per week  Work on [place] her lantus to 60 and then 70 units as tolerated watching out for low glucose and not skipping meals F/u planned  Update if problems  Wt loss enc       Relevant Medications   Insulin Glargine (LANTUS SOLOSTAR) 100 UNIT/ML Solostar Pen   Essential hypertension - Primary    bp in fair control at this time  BP Readings from Last 1 Encounters:  04/13/15 112/60   No changes needed Disc lifstyle change with low sodium diet and exercise  Labs reviewed       Hyperlipidemia    Disc goals for lipids and reasons to control them Rev labs with pt Rev low sat fat diet in detail Statin and diet  HDL is low  Enc exercise  Continue to follow

## 2015-04-15 NOTE — Assessment & Plan Note (Signed)
Disc goals for lipids and reasons to control them Rev labs with pt Rev low sat fat diet in detail Statin and diet  HDL is low  Enc exercise  Continue to follow

## 2015-04-15 NOTE — Assessment & Plan Note (Signed)
Lab Results  Component Value Date   HGBA1C 7.5* 04/10/2015   This is not at goal  Will inc exercise to 5 d per week  Work on [place] her lantus to 60 and then 70 units as tolerated watching out for low glucose and not skipping meals F/u planned  Update if problems  Wt loss enc

## 2015-04-15 NOTE — Assessment & Plan Note (Signed)
bp in fair control at this time  BP Readings from Last 1 Encounters:  04/13/15 112/60   No changes needed Disc lifstyle change with low sodium diet and exercise  Labs reviewed

## 2015-04-28 ENCOUNTER — Other Ambulatory Visit: Payer: Self-pay | Admitting: Family Medicine

## 2015-06-19 ENCOUNTER — Encounter: Payer: Self-pay | Admitting: Family Medicine

## 2015-06-21 NOTE — Telephone Encounter (Signed)
Message below is what [facility] sent me and Dr. Glori Bickers about help as well, Mearl Latin has already left voicemail requesting pt to call office back

## 2015-06-21 NOTE — Telephone Encounter (Signed)
-----   Message from America Brown sent at 06/21/2015  7:15 AM EDT -----  Good morning Dr. Glori Bickers,  If Ms. Petraglia has access to Internet she can go online to [email or web address] and at the top of their home page she will need to select the 5th selection over to the right which is "Lantus Savings Card".  If she puts her information she can activate a savings card and she will only have $25 to pay, if that much.  Another option is [facility] which is a part of [place].  She may have to establish w/them as a patient, at lease while she is uninsured but I don't think there is a co-pay and they fill the medication at no cost.  Their # is [phone].  She can call to see if she qualifies.  I hope one of the above helps.  If not, let me know and I'll research some more.  Thank you! Marge Duncans I am copying [place] on this so she can call the patient since I know you are not in the office today. ----- Message -----    From: Virgina Organ    Sent: 06/20/2015  12:52 PM      To: America Brown, Pete Pelt, LPN, #  Could [place] help out?   ----- Message -----    From: Abner Greenspan, MD    Sent: 06/20/2015   8:16 AM      To: Carlyon Prows  I have a patient out of insurance until oct 1 and she cannot afford her insulin until then - she is home with a very high blood sugar (500-600)  Do you know of any emergency med programs that could help her out?  Thanks , [employer]

## 2015-06-21 NOTE — Telephone Encounter (Signed)
Pt called back and she has 0 money and cannot afford the $175.00 and she has already looked into the lantus card and it is not $25.00 and pt will contact [facility] at [phone] and will email Dr Glori Bickers with status of getting med free. Pt said today her FBS was 300. Pt is not eating any sweets and watching her diet carefully.

## 2015-06-21 NOTE — Telephone Encounter (Signed)
Adrienne asked me to ck on cost of lantus pen for pt at [facility] with [facility] assistance fund. Spoke with [place] at [place]; cannot split box of pens; 5 pens to a box; cost to [facility] will be $375.00 minus $100.00  [employer] savings card; so cost to pt will be $275.00. Vincente Liberty said if pt could pay $175.00, [facility] could pay $100.00 out of assistance fund. Left v/m requesting pt call me back.

## 2015-07-09 ENCOUNTER — Other Ambulatory Visit (INDEPENDENT_AMBULATORY_CARE_PROVIDER_SITE_OTHER): Payer: BLUE CROSS/BLUE SHIELD

## 2015-07-09 DIAGNOSIS — E119 Type 2 diabetes mellitus without complications: Secondary | ICD-10-CM | POA: Diagnosis not present

## 2015-07-09 DIAGNOSIS — E785 Hyperlipidemia, unspecified: Secondary | ICD-10-CM | POA: Diagnosis not present

## 2015-07-09 LAB — LIPID PANEL
Cholesterol: 235 mg/dL — ABNORMAL HIGH (ref 0–200)
HDL: 36.5 mg/dL — ABNORMAL LOW (ref >39.00)
NonHDL: 198.37
Total CHOL/HDL Ratio: 6
Triglycerides: 350 mg/dL — ABNORMAL HIGH (ref 0.0–149.0)
VLDL: 70 mg/dL — AB (ref 0.0–40.0)

## 2015-07-09 LAB — HEMOGLOBIN A1C: HEMOGLOBIN A1C: 9.2 % — AB (ref 4.6–6.5)

## 2015-07-09 LAB — LDL CHOLESTEROL, DIRECT: LDL DIRECT: 161 mg/dL

## 2015-07-16 ENCOUNTER — Encounter: Payer: Self-pay | Admitting: Family Medicine

## 2015-07-16 ENCOUNTER — Other Ambulatory Visit: Payer: Self-pay | Admitting: Family Medicine

## 2015-07-16 ENCOUNTER — Ambulatory Visit (INDEPENDENT_AMBULATORY_CARE_PROVIDER_SITE_OTHER): Payer: BLUE CROSS/BLUE SHIELD | Admitting: Family Medicine

## 2015-07-16 VITALS — BP 122/70 | HR 79 | Temp 97.6°F | Ht 64.0 in | Wt 211.2 lb

## 2015-07-16 DIAGNOSIS — Z794 Long term (current) use of insulin: Secondary | ICD-10-CM | POA: Diagnosis not present

## 2015-07-16 DIAGNOSIS — E785 Hyperlipidemia, unspecified: Secondary | ICD-10-CM

## 2015-07-16 DIAGNOSIS — I10 Essential (primary) hypertension: Secondary | ICD-10-CM | POA: Diagnosis not present

## 2015-07-16 DIAGNOSIS — IMO0001 Reserved for inherently not codable concepts without codable children: Secondary | ICD-10-CM

## 2015-07-16 DIAGNOSIS — E1165 Type 2 diabetes mellitus with hyperglycemia: Secondary | ICD-10-CM | POA: Diagnosis not present

## 2015-07-16 NOTE — Progress Notes (Signed)
Pre visit review using our clinic review tool, if applicable. No additional management support is needed unless otherwise documented below in the visit note. 

## 2015-07-16 NOTE — Progress Notes (Signed)
Subjective:    Patient ID: Stacy Monroe, female    DOB: 1966/06/03, 49 y.o.   MRN: [id number]  HPI Here for f/u of DM and other problems   Was out of insurance for a while - without medicine for about 30 days   Now - has insurance again and it pays for everything  Back on for a few weeks  Wt is down 7 lb  Thrilled about that   Diabetes Home sugar results - finally coming back down / not testing a lot  DM diet - sticking to her DM diet  Exercise - walking  Symptoms- still feeling thirsty/dry mough  A1C last  Lab Results  Component Value Date   HGBA1C 9.2* 07/09/2015  up from 7.5    No problems with medications - on lantus 70- now low glucose readings  Before she ran out of medicines on that dose she was running in the 120s  Renal protection- ace  Last eye exam - has not had yet (can afford after xmas)   Does not get flu shots  Cholesterol Lab Results  Component Value Date   CHOL 235* 07/09/2015   CHOL 121 04/10/2015   CHOL 123 01/03/2015   Lab Results  Component Value Date   HDL 36.50* 07/09/2015   HDL 37.20* 04/10/2015   HDL 37.40* 01/03/2015   Lab Results  Component Value Date   LDLCALC 63 04/10/2015   [place] 57 01/03/2015   [place] 73 06/07/2014   Lab Results  Component Value Date   TRIG 350.0* 07/09/2015   TRIG 102.0 04/10/2015   TRIG 145.0 01/03/2015   Lab Results  Component Value Date   CHOLHDL 6 07/09/2015   CHOLHDL 3 04/10/2015   CHOLHDL 3 01/03/2015   Lab Results  Component Value Date   LDLDIRECT 161.0 07/09/2015   LDLDIRECT 136.4 10/17/2011   LDLDIRECT 148.4 01/17/2011    Is back on cholesterol med - just recently    BP Readings from Last 3 Encounters:  07/16/15 122/70  04/13/15 112/60  01/10/15 114/72    Patient Active Problem List   Diagnosis Date Noted  . Colon cancer screening 01/10/2015  . Routine general medical examination at a health care facility 01/02/2015  . Menorrhagia 06/14/2014  . Low back pain 11/25/2013   . Abnormal EKG 01/08/2012  . Chest pressure 12/04/2011  . Adjustment disorder with mixed anxiety and depressed mood 06/24/2011  . Elevated transaminase level 01/21/2011  . PLANTAR FASCIITIS 10/02/2010  . Vitamin D deficiency 04/26/2010  . Essential hypertension 02/15/2009  . Diabetes mellitus type 2, uncontrolled ([place]) 09/07/2008  . Hyperlipidemia 09/07/2008  . Obesity 04/24/2008  . COMMON MIGRAINE 04/18/2008   Past Medical History  Diagnosis Date  . Hypertension   . Hyperlipidemia   . Diabetes mellitus     type II controlled  . Palpitations   . Obesity   . Migraine     common  . Vertigo   . Plantar fasciitis   . S/P tonsillectomy    Past Surgical History  Procedure Laterality Date  . Bladder surgery  1990    bladder tack after 2nd delivery   Social History  Substance Use Topics  . Smoking status: Never Smoker   . Smokeless tobacco: Never Used  . Alcohol Use: No   Family History  Problem Relation Age of Onset  . Arthritis Mother     RA  . Hypertension Mother   . Drug abuse Mother   . Depression Mother   .  Cancer Sister     thyroid cancer, reoccured 2008  . Depression Sister   . Drug abuse Brother   . Stroke Maternal Grandmother   . Alzheimer's disease Maternal Grandmother   . Depression Sister   . Depression Sister    Allergies  Allergen Reactions  . Shellfish Allergy    Current Outpatient Prescriptions on File Prior to Visit  Medication Sig Dispense Refill  . aspirin 81 MG tablet Take 81 mg by mouth daily.      Marland Kitchen atorvastatin (LIPITOR) 20 MG tablet TAKE ONE TABLET BY MOUTH ONCE DAILY 90 tablet 3  . cetirizine (ZYRTEC) 10 MG tablet TAKE ONE TABLET BY MOUTH EVERY DAY 30 tablet 2  . citalopram (CELEXA) 40 MG tablet TAKE ONE TABLET BY MOUTH ONCE DAILY 90 tablet 3  . glimepiride (AMARYL) 4 MG tablet TAKE ONE TABLET BY MOUTH ONCE DAILY WITH BREAKFAST 30 tablet 11  . glucose blood (ONE TOUCH ULTRA TEST) test strip USE TO TEST BLOOD SUGAR 3-4 TIMES A DAY AS  NEEDED FOR DX. 250.00 100 each 5  . Insulin Glargine (LANTUS SOLOSTAR) 100 UNIT/ML Solostar Pen Inject 70 Units into the skin daily at 10 pm. 15 mL 11  . lansoprazole (PREVACID 24HR) 15 MG capsule Take 1 capsule (15 mg total) by mouth daily. 90 capsule 3  . lisinopril (PRINIVIL,ZESTRIL) 20 MG tablet TAKE ONE TABLET BY MOUTH ONCE DAILY 30 tablet 2  . metFORMIN (GLUCOPHAGE-XR) 500 MG 24 hr tablet TAKE TWO TABLETS BY MOUTH IN THE EVENING 180 tablet 3  . ZETIA 10 MG tablet TAKE ONE TABLET BY MOUTH ONCE DAILY 30 tablet 11  . [DISCONTINUED] ipratropium (ATROVENT) 0.03 % nasal spray Place 2 sprays into the nose 3 (three) times daily. 30 mL 2   No current facility-administered medications on file prior to visit.      Review of Systems Review of Systems  Constitutional: Negative for fever, appetite change, and unexpected weight change.  Eyes: Negative for pain and visual disturbance.  Respiratory: Negative for cough and shortness of breath.   Cardiovascular: Negative for cp or palpitations    Gastrointestinal: Negative for nausea, diarrhea and constipation.  Genitourinary: Negative for urgency and frequency. pos for thirst  Skin: Negative for pallor or rash   Neurological: Negative for weakness, light-headedness, numbness and headaches.  Hematological: Negative for adenopathy. Does not bruise/bleed easily.  Psychiatric/Behavioral: Negative for dysphoric mood. The patient is not nervous/anxious.         Objective:   Physical Exam  Constitutional: She appears well-developed and well-nourished. No distress.  obese and well appearing   HENT:  Head: Normocephalic and atraumatic.  Mouth/Throat: Oropharynx is clear and moist.  Eyes: Conjunctivae and EOM are normal. Pupils are equal, round, and reactive to light.  Neck: Normal range of motion. Neck supple. No JVD present. Carotid bruit is not present. No thyromegaly present.  Cardiovascular: Normal rate, regular rhythm, normal heart sounds and  intact distal pulses.  Exam reveals no gallop.   Pulmonary/Chest: Effort normal and breath sounds normal. No respiratory distress. She has no wheezes. She has no rales.  No crackles  Abdominal: Soft. Bowel sounds are normal. She exhibits no distension and no abdominal bruit.  Musculoskeletal: She exhibits no edema.  Lymphadenopathy:    She has no cervical adenopathy.  Neurological: She is alert. She has normal reflexes.  Skin: Skin is warm and dry. No rash noted.  Psychiatric: She has a normal mood and affect.  Blunted affect today  Assessment & Plan:   Problem List Items Addressed This Visit      Cardiovascular and Mediastinum   Essential hypertension - Primary    bp in fair control at this time  BP Readings from Last 1 Encounters:  07/16/15 122/70   No changes needed Disc lifstyle change with low sodium diet and exercise  Pt is currently back on medication         Endocrine   Diabetes mellitus type 2, uncontrolled ([place])    Lab Results  Component Value Date   HGBA1C 9.2* 07/09/2015   This went up after loosing ins for 1 mo and being off medication  This is improving now  On 70 u of lantus Asked for her to check glucose 1-2 times daily and report back with readings F/u 3 mo with lab prior Will be able to afford eye exam in the winter Rev low glycemic diet  Enc wt loss         Other   Hyperlipidemia    Lipids did inc after no statin for 1 mo  Back on lipitor and zetia now Disc goals for lipids and reasons to control them Rev labs with pt Rev low sat fat diet in detail  r e check 3 mo

## 2015-07-16 NOTE — Patient Instructions (Signed)
Start checking some blood glucose levels please - am and 2 hours after a meal so we know if you need medication adjustment Get your eye exam when you can (after the holidays) Keep working on low sugar and low fat diet and exercise and weight loss   Follow up in 3 months with labs prior

## 2015-07-16 NOTE — Assessment & Plan Note (Signed)
Lab Results  Component Value Date   HGBA1C 9.2* 07/09/2015   This went up after loosing ins for 1 mo and being off medication  This is improving now  On 70 u of lantus Asked for her to check glucose 1-2 times daily and report back with readings F/u 3 mo with lab prior Will be able to afford eye exam in the winter Rev low glycemic diet  Enc wt loss

## 2015-07-16 NOTE — Assessment & Plan Note (Signed)
Lipids did inc after no statin for 1 mo  Back on lipitor and zetia now Disc goals for lipids and reasons to control them Rev labs with pt Rev low sat fat diet in detail  r e check 3 mo

## 2015-07-16 NOTE — Assessment & Plan Note (Signed)
bp in fair control at this time  BP Readings from Last 1 Encounters:  07/16/15 122/70   No changes needed Disc lifstyle change with low sodium diet and exercise  Pt is currently back on medication

## 2015-08-20 ENCOUNTER — Other Ambulatory Visit: Payer: Self-pay | Admitting: Family Medicine

## 2015-10-02 ENCOUNTER — Encounter: Payer: Self-pay | Admitting: Family Medicine

## 2015-10-03 ENCOUNTER — Telehealth: Payer: Self-pay | Admitting: Family Medicine

## 2015-10-03 DIAGNOSIS — IMO0001 Reserved for inherently not codable concepts without codable children: Secondary | ICD-10-CM

## 2015-10-03 DIAGNOSIS — Z794 Long term (current) use of insulin: Principal | ICD-10-CM

## 2015-10-03 DIAGNOSIS — E1165 Type 2 diabetes mellitus with hyperglycemia: Principal | ICD-10-CM

## 2015-10-08 NOTE — Telephone Encounter (Signed)
Ref to endocrinology.

## 2015-10-11 ENCOUNTER — Other Ambulatory Visit: Payer: BLUE CROSS/BLUE SHIELD

## 2015-10-16 ENCOUNTER — Ambulatory Visit: Payer: BLUE CROSS/BLUE SHIELD | Admitting: Family Medicine

## 2015-10-16 ENCOUNTER — Ambulatory Visit (INDEPENDENT_AMBULATORY_CARE_PROVIDER_SITE_OTHER): Payer: BLUE CROSS/BLUE SHIELD | Admitting: Family Medicine

## 2015-10-16 ENCOUNTER — Encounter: Payer: Self-pay | Admitting: Family Medicine

## 2015-10-16 ENCOUNTER — Ambulatory Visit (INDEPENDENT_AMBULATORY_CARE_PROVIDER_SITE_OTHER)
Admission: RE | Admit: 2015-10-16 | Discharge: 2015-10-16 | Disposition: A | Discharge status: Home / Self Care | Payer: BLUE CROSS/BLUE SHIELD | Source: Ambulatory Visit | Attending: Family Medicine | Admitting: Family Medicine

## 2015-10-16 VITALS — BP 142/90 | HR 74 | Temp 97.9°F | Ht 64.0 in | Wt 218.5 lb

## 2015-10-16 DIAGNOSIS — M542 Cervicalgia: Secondary | ICD-10-CM

## 2015-10-16 MED ORDER — CYCLOBENZAPRINE HCL 10 MG PO TABS: 10.0000 mg | ORAL_TABLET | Freq: Every evening | ORAL | Status: DC | PRN

## 2015-10-16 NOTE — Progress Notes (Signed)
Pre visit review using our clinic review tool, if applicable. No additional management support is needed unless otherwise documented below in the visit note. 

## 2015-10-16 NOTE — Progress Notes (Signed)
Subjective:    Patient ID: Stacy Monroe, female    DOB: 1965-12-16, 50 y.o.   MRN: [id number]  HPI Here with neck/back pain   Started as a "muscle knot" in her L side  Neck and upper back  Radiates down to her arm  Worse in the am   Some numbness in L hand and fingers at times  No weakness   Some headaches  occ light headed    Some good days and bad days  Always there   OTC has taken aleve and ibuprofen - work about the same   Difficult to sleep Has tried 3 pillows  Has a cervical support pillow  Feels better just to sleep sitting up    Patient Active Problem List   Diagnosis Date Noted  . Neck pain 10/16/2015  . Colon cancer screening 01/10/2015  . Routine general medical examination at a health care facility 01/02/2015  . Menorrhagia 06/14/2014  . Low back pain 11/25/2013  . Abnormal EKG 01/08/2012  . Chest pressure 12/04/2011  . Adjustment disorder with mixed anxiety and depressed mood 06/24/2011  . Elevated transaminase level 01/21/2011  . PLANTAR FASCIITIS 10/02/2010  . Vitamin D deficiency 04/26/2010  . Essential hypertension 02/15/2009  . Diabetes mellitus type 2, uncontrolled ([place]) 09/07/2008  . Hyperlipidemia 09/07/2008  . Obesity 04/24/2008  . COMMON MIGRAINE 04/18/2008   Past Medical History  Diagnosis Date  . Hypertension   . Hyperlipidemia   . Diabetes mellitus     type II controlled  . Palpitations   . Obesity   . Migraine     common  . Vertigo   . Plantar fasciitis   . S/P tonsillectomy    Past Surgical History  Procedure Laterality Date  . Bladder surgery  1990    bladder tack after 2nd delivery   Social History  Substance Use Topics  . Smoking status: Never Smoker   . Smokeless tobacco: Never Used  . Alcohol Use: No   Family History  Problem Relation Age of Onset  . Arthritis Mother     RA  . Hypertension Mother   . Drug abuse Mother   . Depression Mother   . Cancer Sister     thyroid cancer, reoccured 2008  .  Depression Sister   . Drug abuse Brother   . Stroke Maternal Grandmother   . Alzheimer's disease Maternal Grandmother   . Depression Sister   . Depression Sister    Allergies  Allergen Reactions  . Shellfish Allergy    Current Outpatient Prescriptions on File Prior to Visit  Medication Sig Dispense Refill  . aspirin 81 MG tablet Take 81 mg by mouth daily.      Marland Kitchen atorvastatin (LIPITOR) 20 MG tablet TAKE ONE TABLET BY MOUTH ONCE DAILY 90 tablet 3  . cetirizine (ZYRTEC) 10 MG tablet TAKE ONE TABLET BY MOUTH EVERY DAY 30 tablet 2  . glimepiride (AMARYL) 4 MG tablet TAKE ONE TABLET BY MOUTH ONCE DAILY WITH BREAKFAST 30 tablet 11  . glucose blood (ONE TOUCH ULTRA TEST) test strip USE ONE STRIP TO CHECK GLUCOSE THREE TIMES DAILY TO 4 TIMES DAILY AS NEEDED Dx. E11.9 100 each 5  . Insulin Glargine (LANTUS SOLOSTAR) 100 UNIT/ML Solostar Pen Inject 70 Units into the skin daily at 10 pm. 15 mL 11  . lansoprazole (PREVACID 24HR) 15 MG capsule Take 1 capsule (15 mg total) by mouth daily. 90 capsule 3  . lisinopril (PRINIVIL,ZESTRIL) 20 MG tablet TAKE ONE  TABLET BY MOUTH ONCE DAILY 30 tablet 2  . metFORMIN (GLUCOPHAGE-XR) 500 MG 24 hr tablet TAKE TWO TABLETS BY MOUTH IN THE EVENING 180 tablet 3  . ZETIA 10 MG tablet TAKE ONE TABLET BY MOUTH ONCE DAILY 30 tablet 11  . [DISCONTINUED] ipratropium (ATROVENT) 0.03 % nasal spray Place 2 sprays into the nose 3 (three) times daily. 30 mL 2   No current facility-administered medications on file prior to visit.      Review of Systems Review of Systems  Constitutional: Negative for fever, appetite change, fatigue and unexpected weight change.  Eyes: Negative for pain and visual disturbance.  Respiratory: Negative for cough and shortness of breath.   Cardiovascular: Negative for cp or palpitations    Gastrointestinal: Negative for nausea, diarrhea and constipation.  Genitourinary: Negative for urgency and frequency.  Skin: Negative for pallor or rash   MSK  pos for L neck pain that rad to her arm  Neurological: Negative for weakness, light-headedness, numbness and headaches.  Hematological: Negative for adenopathy. Does not bruise/bleed easily.  Psychiatric/Behavioral: Negative for dysphoric mood. The patient is not nervous/anxious.         Objective:   Physical Exam  Constitutional: She appears well-developed and well-nourished. No distress.  obese and well appearing   HENT:  Head: Normocephalic and atraumatic.  Mouth/Throat: Oropharynx is clear and moist.  Eyes: Conjunctivae and EOM are normal. Pupils are equal, round, and reactive to light. No scleral icterus.  Neck: Normal range of motion. Neck supple.  Cardiovascular: Normal rate and regular rhythm.   Pulmonary/Chest: Effort normal and breath sounds normal. No respiratory distress. She has no wheezes.  Musculoskeletal: She exhibits tenderness. She exhibits no edema.       Cervical back: She exhibits decreased range of motion, tenderness and spasm. She exhibits no bony tenderness and no edema.  Tender L cervical musculature Some pain med to L scapula  No bony tenderness Pain in flex and L rotation and tilt   Nl rom shoulder and arm  No focal neuro signs   Lymphadenopathy:    She has no cervical adenopathy.  Neurological: She is alert. She has normal reflexes. She displays no atrophy. No cranial nerve deficit or sensory deficit. She exhibits normal muscle tone. Coordination normal.  Skin: Skin is warm and dry. No rash noted. No erythema. No pallor.  Psychiatric: She has a normal mood and affect.          Assessment & Plan:   Problem List Items Addressed This Visit      Other   Neck pain - Primary    This may be acute on chronic  Take 2 aleve with food (meal) every 12 hours - for pain and inflammation  Try flexeril at night - will make you sleepy  Use heat when you can if it helps (you can try ice also)  Choose the pillow that works the best for you  Cervical spine  xray today       Relevant Orders   DG Cervical Spine Complete (Completed)

## 2015-10-16 NOTE — Patient Instructions (Signed)
Take 2 aleve with food (meal) every 12 hours - for pain and inflammation  Try flexeril at night - will make you sleepy  Use heat when you can if it helps (you can try ice also)  Choose the pillow that works the best for you  Cervical spine xray today

## 2015-10-17 ENCOUNTER — Encounter: Payer: Self-pay | Admitting: Family Medicine

## 2015-10-18 NOTE — Assessment & Plan Note (Signed)
This may be acute on chronic  Take 2 aleve with food (meal) every 12 hours - for pain and inflammation  Try flexeril at night - will make you sleepy  Use heat when you can if it helps (you can try ice also)  Choose the pillow that works the best for you  Cervical spine xray today

## 2015-10-31 ENCOUNTER — Ambulatory Visit (INDEPENDENT_AMBULATORY_CARE_PROVIDER_SITE_OTHER): Payer: BLUE CROSS/BLUE SHIELD | Admitting: Family Medicine

## 2015-10-31 ENCOUNTER — Encounter: Payer: Self-pay | Admitting: Family Medicine

## 2015-10-31 VITALS — BP 126/78 | HR 92 | Temp 98.0°F | Ht 64.0 in | Wt 212.5 lb

## 2015-10-31 DIAGNOSIS — M5441 Lumbago with sciatica, right side: Secondary | ICD-10-CM

## 2015-10-31 DIAGNOSIS — R109 Unspecified abdominal pain: Secondary | ICD-10-CM

## 2015-10-31 LAB — POC URINALSYSI DIPSTICK (AUTOMATED)
Bilirubin, UA: NEGATIVE
Glucose, UA: NEGATIVE
KETONES UA: NEGATIVE
LEUKOCYTES UA: NEGATIVE
Nitrite, UA: NEGATIVE
PH UA: 6
UROBILINOGEN UA: 0.2

## 2015-10-31 NOTE — Patient Instructions (Signed)
Go back to aleve and flexeril (caution with flexeril) Use heat for 10 minutes at a time Walk (slowly) as much as you can  Urine looks ok under the microscope - we will send it for a culture to make sure there is no infection   Update if not starting to improve in a week or if worsening

## 2015-10-31 NOTE — Progress Notes (Signed)
Pre visit review using our clinic review tool, if applicable. No additional management support is needed unless otherwise documented below in the visit note. 

## 2015-10-31 NOTE — Progress Notes (Signed)
Subjective:    Patient ID: Stacy Monroe, female    DOB: 04-Nov-1965, 50 y.o.   MRN: [id number]  HPI Here with low back pain  Is on the R side/ points to hip and buttock area  Since Monday   ? Urinary symptoms - no pain to urinate but it makes the back pain worse  No frequency or urgency  No blood in urine   No new activity or lifting    Mornings are worse   Does not radiate down her leg   Hurts worse when she tries to lift her R leg up   No numbness or weakness    UA - some rbc  No leukocytes  Results for orders placed or performed in visit on 10/31/15  POCT Urinalysis Dipstick (Automated)  Result Value Ref Range   Color, UA Amber    Clarity, UA Hazy    Glucose, UA Neg.    Bilirubin, UA Neg.    Ketones, UA Neg.    Spec Grav, UA >=1.030    Blood, UA Large (200 Ery/uL)    pH, UA 6.0    Protein, UA 15 mg/dL    Urobilinogen, UA 0.2    Nitrite, UA neg.    Leukocytes, UA Negative Negative    No menses since dec  No chance of pregnancy (partner had a vasectomy)   Just started victoza for DM and it is working well   Last LS pain was 2015 - it was midline - xray was normal   Not taking anything for it   Neck is improved-had a massage and it got better   Patient Active Problem List   Diagnosis Date Noted  . Neck pain 10/16/2015  . Colon cancer screening 01/10/2015  . Routine general medical examination at a health care facility 01/02/2015  . Menorrhagia 06/14/2014  . Low back pain 11/25/2013  . Abnormal EKG 01/08/2012  . Chest pressure 12/04/2011  . Adjustment disorder with mixed anxiety and depressed mood 06/24/2011  . Elevated transaminase level 01/21/2011  . PLANTAR FASCIITIS 10/02/2010  . Vitamin D deficiency 04/26/2010  . Essential hypertension 02/15/2009  . Diabetes mellitus type 2, uncontrolled ([place]) 09/07/2008  . Hyperlipidemia 09/07/2008  . Obesity 04/24/2008  . COMMON MIGRAINE 04/18/2008   Past Medical History  Diagnosis Date  .  Hypertension   . Hyperlipidemia   . Diabetes mellitus     type II controlled  . Palpitations   . Obesity   . Migraine     common  . Vertigo   . Plantar fasciitis   . S/P tonsillectomy    Past Surgical History  Procedure Laterality Date  . Bladder surgery  1990    bladder tack after 2nd delivery   Social History  Substance Use Topics  . Smoking status: Never Smoker   . Smokeless tobacco: Never Used  . Alcohol Use: No   Family History  Problem Relation Age of Onset  . Arthritis Mother     RA  . Hypertension Mother   . Drug abuse Mother   . Depression Mother   . Cancer Sister     thyroid cancer, reoccured 2008  . Depression Sister   . Drug abuse Brother   . Stroke Maternal Grandmother   . Alzheimer's disease Maternal Grandmother   . Depression Sister   . Depression Sister    Allergies  Allergen Reactions  . Shellfish Allergy    Current Outpatient Prescriptions on File Prior to Visit  Medication Sig  Dispense Refill  . aspirin 81 MG tablet Take 81 mg by mouth daily.      Marland Kitchen atorvastatin (LIPITOR) 20 MG tablet TAKE ONE TABLET BY MOUTH ONCE DAILY 90 tablet 3  . cetirizine (ZYRTEC) 10 MG tablet TAKE ONE TABLET BY MOUTH EVERY DAY 30 tablet 2  . cyclobenzaprine (FLEXERIL) 10 MG tablet Take 1 tablet (10 mg total) by mouth at bedtime as needed for muscle spasms (take when not working or driving). 30 tablet 1  . glucose blood (ONE TOUCH ULTRA TEST) test strip USE ONE STRIP TO CHECK GLUCOSE THREE TIMES DAILY TO 4 TIMES DAILY AS NEEDED Dx. E11.9 100 each 5  . Insulin Glargine (LANTUS SOLOSTAR) 100 UNIT/ML Solostar Pen Inject 70 Units into the skin daily at 10 pm. (Patient taking differently: Inject 60 Units into the skin daily at 10 pm. ) 15 mL 11  . lansoprazole (PREVACID 24HR) 15 MG capsule Take 1 capsule (15 mg total) by mouth daily. 90 capsule 3  . lisinopril (PRINIVIL,ZESTRIL) 20 MG tablet TAKE ONE TABLET BY MOUTH ONCE DAILY 30 tablet 2  . metFORMIN (GLUCOPHAGE-XR) 500 MG 24  hr tablet TAKE TWO TABLETS BY MOUTH IN THE EVENING 180 tablet 3  . ZETIA 10 MG tablet TAKE ONE TABLET BY MOUTH ONCE DAILY 30 tablet 11  . [DISCONTINUED] ipratropium (ATROVENT) 0.03 % nasal spray Place 2 sprays into the nose 3 (three) times daily. 30 mL 2   No current facility-administered medications on file prior to visit.    Review of Systems Review of Systems  Constitutional: Negative for fever, appetite change, fatigue and unexpected weight change.  Eyes: Negative for pain and visual disturbance.  Respiratory: Negative for cough and shortness of breath.   Cardiovascular: Negative for cp or palpitations    Gastrointestinal: Negative for nausea, diarrhea and constipation.  Genitourinary: Negative for urgency and frequency.  Skin: Negative for pallor or rash   MSK pos for low back pain on R Neurological: Negative for weakness, light-headedness, numbness and headaches.  Hematological: Negative for adenopathy. Does not bruise/bleed easily.  Psychiatric/Behavioral: Negative for dysphoric mood. The patient is not nervous/anxious.          Objective:   Physical Exam  Constitutional: She appears well-developed and well-nourished. No distress.  obese and well appearing   HENT:  Head: Normocephalic and atraumatic.  Eyes: Conjunctivae and EOM are normal. Pupils are equal, round, and reactive to light. No scleral icterus.  Neck: Normal range of motion. Neck supple.  Cardiovascular: Normal rate and regular rhythm.   Pulmonary/Chest: Effort normal and breath sounds normal. She has no wheezes. She has no rales.  Abdominal: Soft. Bowel sounds are normal. She exhibits no distension. There is no tenderness. There is no CVA tenderness.  No suprapubic tenderness or fullness    Musculoskeletal: She exhibits tenderness. She exhibits no edema.       Lumbar back: She exhibits decreased range of motion, tenderness and spasm. She exhibits no bony tenderness and no edema.  Tender over R lumbar  musculature and piriformis area  Piriformis stretch did not target area of pain  Pain to flex R hip /raise leg but no discomfort on hip rotation   SLR intensifies low back and buttock pain   Gait is slow and labored No neuro findings   Lymphadenopathy:    She has no cervical adenopathy.  Neurological: She is alert. She has normal strength and normal reflexes. She displays no atrophy. No cranial nerve deficit or sensory deficit. She  exhibits normal muscle tone. Coordination normal.  Negative SLR  Skin: Skin is warm and dry. No rash noted. No erythema. No pallor.  Psychiatric: She has a normal mood and affect.          Assessment & Plan:   Problem List Items Addressed This Visit      Other   Low back pain    Suspect lumbosacral strain  Rev last LS films- nl  Some rad to R leg- with hip flexion  ua - dip pos for blood/ micro neg-sent for cx Recommend sympt care- walking/ heat /nsaid/flexeril Go back to aleve and flexeril (caution with flexeril) Use heat for 10 minutes at a time Walk (slowly) as much as you can  Urine looks ok under the microscope - we will send it for a culture to make sure there is no infection   Update if not starting to improve in a week or if worsening         Other Visit Diagnoses    Right flank pain    -  Primary    Relevant Orders    POCT Urinalysis Dipstick (Automated) (Completed)    Urine culture

## 2015-11-01 LAB — URINE CULTURE

## 2015-11-01 LAB — POCT UA - MICROSCOPIC ONLY

## 2015-11-01 NOTE — Assessment & Plan Note (Signed)
Suspect lumbosacral strain  Rev last LS films- nl  Some rad to R leg- with hip flexion  ua - dip pos for blood/ micro neg-sent for cx Recommend sympt care- walking/ heat /nsaid/flexeril Go back to aleve and flexeril (caution with flexeril) Use heat for 10 minutes at a time Walk (slowly) as much as you can  Urine looks ok under the microscope - we will send it for a culture to make sure there is no infection   Update if not starting to improve in a week or if worsening

## 2015-12-13 ENCOUNTER — Other Ambulatory Visit: Payer: Self-pay | Admitting: Family Medicine

## 2016-02-14 ENCOUNTER — Encounter: Payer: Self-pay | Admitting: Family Medicine

## 2016-02-16 ENCOUNTER — Other Ambulatory Visit: Payer: Self-pay | Admitting: Family Medicine

## 2016-02-18 NOTE — Telephone Encounter (Signed)
Please schedule PE for late fall and refill until then thanks

## 2016-02-18 NOTE — Telephone Encounter (Signed)
Pt has had a few acute appts recently but no recent f/u or CPE and no future appts and no recent labs, please advise

## 2016-02-19 NOTE — Telephone Encounter (Signed)
CPE scheduled but Rx declined, pt sees Endo and she said they are filling med, Rx was sent to [place] in error

## 2016-02-29 ENCOUNTER — Telehealth: Payer: Self-pay | Admitting: Family Medicine

## 2016-02-29 ENCOUNTER — Encounter: Payer: Self-pay | Admitting: Family Medicine

## 2016-02-29 MED ORDER — ELETRIPTAN HYDROBROMIDE 40 MG PO TABS: ORAL_TABLET | ORAL | Status: DC

## 2016-02-29 NOTE — Telephone Encounter (Signed)
relpax px

## 2016-03-07 ENCOUNTER — Telehealth: Payer: Self-pay | Admitting: Family Medicine

## 2016-03-07 ENCOUNTER — Encounter: Payer: Self-pay | Admitting: Emergency Medicine

## 2016-03-07 ENCOUNTER — Emergency Department
Admission: EM | Admit: 2016-03-07 | Discharge: 2016-03-07 | Disposition: A | Discharge status: Home / Self Care | Payer: BC Managed Care – PPO | Attending: Emergency Medicine | Admitting: Emergency Medicine

## 2016-03-07 DIAGNOSIS — Z7984 Long term (current) use of oral hypoglycemic drugs: Secondary | ICD-10-CM | POA: Insufficient documentation

## 2016-03-07 DIAGNOSIS — Z7982 Long term (current) use of aspirin: Secondary | ICD-10-CM | POA: Diagnosis not present

## 2016-03-07 DIAGNOSIS — Z79899 Other long term (current) drug therapy: Secondary | ICD-10-CM | POA: Insufficient documentation

## 2016-03-07 DIAGNOSIS — I10 Essential (primary) hypertension: Secondary | ICD-10-CM | POA: Diagnosis not present

## 2016-03-07 DIAGNOSIS — E119 Type 2 diabetes mellitus without complications: Secondary | ICD-10-CM | POA: Insufficient documentation

## 2016-03-07 DIAGNOSIS — E785 Hyperlipidemia, unspecified: Secondary | ICD-10-CM | POA: Insufficient documentation

## 2016-03-07 DIAGNOSIS — Z85038 Personal history of other malignant neoplasm of large intestine: Secondary | ICD-10-CM | POA: Diagnosis not present

## 2016-03-07 DIAGNOSIS — R531 Weakness: Secondary | ICD-10-CM

## 2016-03-07 DIAGNOSIS — Z794 Long term (current) use of insulin: Secondary | ICD-10-CM | POA: Insufficient documentation

## 2016-03-07 DIAGNOSIS — R232 Flushing: Secondary | ICD-10-CM | POA: Diagnosis not present

## 2016-03-07 LAB — COMPREHENSIVE METABOLIC PANEL
ALT: 17 U/L (ref 14–54)
AST: 19 U/L (ref 15–41)
Albumin: 4.1 g/dL (ref 3.5–5.0)
Alkaline Phosphatase: 40 U/L (ref 38–126)
Anion gap: 12 (ref 5–15)
BUN: 14 mg/dL (ref 6–20)
CHLORIDE: 104 mmol/L (ref 101–111)
CO2: 22 mmol/L (ref 22–32)
CREATININE: 0.77 mg/dL (ref 0.44–1.00)
Calcium: 9 mg/dL (ref 8.9–10.3)
GFR calc non Af Amer: >60 mL/min (ref >60)
Glucose, Bld: 130 mg/dL — ABNORMAL HIGH (ref 65–99)
Potassium: 3.9 mmol/L (ref 3.5–5.1)
SODIUM: 138 mmol/L (ref 135–145)
Total Bilirubin: 0.4 mg/dL (ref 0.3–1.2)
Total Protein: 7 g/dL (ref 6.5–8.1)

## 2016-03-07 LAB — CBC
HCT: 38.4 % (ref 35.0–47.0)
Hemoglobin: 13 g/dL (ref 12.0–16.0)
MCH: 28.3 pg (ref 26.0–34.0)
MCHC: 33.8 g/dL (ref 32.0–36.0)
MCV: 83.6 fL (ref 80.0–100.0)
PLATELETS: 241 10*3/uL (ref 150–440)
RBC: 4.6 MIL/uL (ref 3.80–5.20)
RDW: 15.1 % — ABNORMAL HIGH (ref 11.5–14.5)
WBC: 8.9 10*3/uL (ref 3.6–11.0)

## 2016-03-07 LAB — TROPONIN I: Troponin I: <0.03 ng/mL (ref <0.031)

## 2016-03-07 NOTE — ED Notes (Signed)
While at work today, began feeling a "bubbling pressure starting in abdomen and moved up into head"  C/O feeling flushed.  Episodes are intermittent.  States blood pressure and heart rate elevate with episodes.

## 2016-03-07 NOTE — Telephone Encounter (Signed)
Patient Name: [name] DOB: 05-15-1966 Initial Comment wife just felt like upper body was pulsating, history of high BP, ems is there Nurse Assessment Nurse: Ronnald Ramp, RN, Miranda Date/Time (Eastern Time): 03/07/2016 10:10:33 AM Confirm and document reason for call. If symptomatic, describe symptoms. You must click the next button to save text entered. ---Caller states his wife was feeling like her upper was pulsating for the last hr. She has been having headaches all week but no headache now. They called EMS and her BP was 168/97 but down to 165/82. Has the patient traveled out of the country within the last 30 days? ---Not Applicable Does the patient have any new or worsening symptoms? ---Yes Will a triage be completed? ---Yes Related visit to physician within the last 2 weeks? ---No Does the PT have any chronic conditions? (i.e. diabetes, asthma, etc.) ---Yes List chronic conditions. ---HTN, Diabetes, Migraines Is the patient pregnant or possibly pregnant? (Ask all females between the ages of 77-55) ---No Is this a behavioral health or substance abuse call? ---No Guidelines Guideline Title Affirmed Question Affirmed Notes Neurologic Deficit Patient sounds very sick or weak to the triager Final Disposition User Go to ED Now (or PCP triage) Ronnald Ramp, RN, Miranda Comments No appt available at the office. Pt is feeling bad enough to call EMS, so recommended taking her to the ED. Referrals [facility] - ED Disagree/Comply: Comply

## 2016-03-07 NOTE — Telephone Encounter (Signed)
Per chart review tab pt is at [facility] ED. 

## 2016-03-07 NOTE — Telephone Encounter (Signed)
Please put her in a 4:15 slot -that way I can spend as much time as I need  Thanks!

## 2016-03-07 NOTE — Telephone Encounter (Signed)
Scheduled 4:15 pm on 03/10/16.

## 2016-03-07 NOTE — Discharge Instructions (Signed)
Fatigue  Fatigue is feeling tired all of the time, a lack of energy, or a lack of motivation. Occasional or mild fatigue is often a normal response to activity or life in general. However, long-lasting (chronic) or extreme fatigue may indicate an underlying medical condition.  HOME CARE INSTRUCTIONS   Watch your fatigue for any changes. The following actions may help to lessen any discomfort you are feeling:  · Talk to your health care provider about how much sleep you need each night. Try to get the required amount every night.  · Take medicines only as directed by your health care provider.  · Eat a healthy and nutritious diet. Ask your health care provider if you need help changing your diet.  · Drink enough fluid to keep your urine clear or pale yellow.  · Practice ways of relaxing, such as yoga, meditation, massage therapy, or acupuncture.  · Exercise regularly.    · Change situations that cause you stress. Try to keep your work and personal routine reasonable.  · Do not abuse illegal drugs.  · Limit alcohol intake to no more than 1 drink per day for nonpregnant women and 2 drinks per day for men. One drink equals 12 ounces of beer, 5 ounces of wine, or 1½ ounces of hard liquor.  · Take a multivitamin, if directed by your health care provider.  SEEK MEDICAL CARE IF:   · Your fatigue does not get better.  · You have a fever.    · You have unintentional weight loss or gain.  · You have headaches.    · You have difficulty:      Falling asleep.    Sleeping throughout the night.  · You feel angry, guilty, anxious, or sad.     · You are unable to have a bowel movement (constipation).    · You skin is dry.     · Your legs or another part of your body is swollen.    SEEK IMMEDIATE MEDICAL CARE IF:   · You feel confused.    · Your vision is blurry.  · You feel faint or pass out.    · You have a severe headache.    · You have severe abdominal, pelvic, or back pain.    · You have chest pain, shortness of breath, or an  irregular or fast heartbeat.    · You are unable to urinate or you urinate less than normal.    · You develop abnormal bleeding, such as bleeding from the rectum, vagina, nose, lungs, or nipples.  · You vomit blood.     · You have thoughts about harming yourself or committing suicide.    · You are worried that you might harm someone else.       This information is not intended to replace advice given to you by your health care provider. Make sure you discuss any questions you have with your health care provider.     Document Released: 07/13/2007 Document Revised: 10/06/2014 Document Reviewed: 01/17/2014  Elsevier Interactive Patient Education ©2016 [employer]

## 2016-03-07 NOTE — ED Notes (Signed)
EDP at bedside  

## 2016-03-07 NOTE — ED Provider Notes (Signed)
[facility] Emergency Department Provider Note  Time seen: 11:31 AM  I have reviewed the triage vital signs and the nursing notes.   HISTORY  Chief Complaint Headache    HPI Stacy Monroe is a 50 y.o. female with a past medical history of hypertension, hyperlipidemia, diabetes, presents the emergency department with symptoms of intermittent flushing. Patient states while at work today she has been experiencing episodes which she describes as a bubbling sensation in her stomach that then bubbles up into her chest and into her head. Feels a sense of flushing gets very hot occasionally gets sweaty and dizzy, and feels a sensation of pulsating throughout her body. Symptoms will last for several minutes and then resolved, and recur minutes or hours later. Patient denies any similar symptoms previously. Denies any pain. Denies any headache, focal weakness or numbness, chest pain or trouble breathing. Denies any symptoms currently. Patient's last period was 5/14, states they have been very irregular lately.     Past Medical History  Diagnosis Date  . Hypertension   . Hyperlipidemia   . Diabetes mellitus     type II controlled  . Palpitations   . Obesity   . Migraine     common  . Vertigo   . Plantar fasciitis   . S/P tonsillectomy     Patient Active Problem List   Diagnosis Date Noted  . Neck pain 10/16/2015  . Colon cancer screening 01/10/2015  . Routine general medical examination at a health care facility 01/02/2015  . Menorrhagia 06/14/2014  . Low back pain 11/25/2013  . Abnormal EKG 01/08/2012  . Chest pressure 12/04/2011  . Adjustment disorder with mixed anxiety and depressed mood 06/24/2011  . Elevated transaminase level 01/21/2011  . PLANTAR FASCIITIS 10/02/2010  . Vitamin D deficiency 04/26/2010  . Essential hypertension 02/15/2009  . Diabetes mellitus type 2, uncontrolled ([place]) 09/07/2008  . Hyperlipidemia 09/07/2008  . Obesity 04/24/2008   . COMMON MIGRAINE 04/18/2008    Past Surgical History  Procedure Laterality Date  . Bladder surgery  1990    bladder tack after 2nd delivery    Current Outpatient Rx  Name  Route  Sig  Dispense  Refill  . aspirin 81 MG tablet   Oral   Take 81 mg by mouth daily.           Marland Kitchen atorvastatin (LIPITOR) 20 MG tablet      TAKE ONE TABLET BY MOUTH ONCE DAILY   90 tablet   0   . cetirizine (ZYRTEC) 10 MG tablet      TAKE ONE TABLET BY MOUTH EVERY DAY   30 tablet   2   . citalopram (CELEXA) 40 MG tablet      TAKE ONE TABLET BY MOUTH ONCE DAILY   90 tablet   0   . cyclobenzaprine (FLEXERIL) 10 MG tablet   Oral   Take 1 tablet (10 mg total) by mouth at bedtime as needed for muscle spasms (take when not working or driving).   30 tablet   1   . eletriptan (RELPAX) 40 MG tablet      1 tablet by mouth at onset of headache, repeat in 2 hours if not resolved.   10 tablet   5   . glucose blood (ONE TOUCH ULTRA TEST) test strip      USE ONE STRIP TO CHECK GLUCOSE THREE TIMES DAILY TO 4 TIMES DAILY AS NEEDED Dx. E11.9   100 each   5   .  Insulin Glargine (LANTUS SOLOSTAR) 100 UNIT/ML Solostar Pen   Subcutaneous   Inject 70 Units into the skin daily at 10 pm. Patient taking differently: Inject 60 Units into the skin daily at 10 pm.    15 mL   11   . lansoprazole (PREVACID 24HR) 15 MG capsule   Oral   Take 1 capsule (15 mg total) by mouth daily.   90 capsule   3   . lisinopril (PRINIVIL,ZESTRIL) 20 MG tablet      TAKE ONE TABLET BY MOUTH ONCE DAILY   90 tablet   0   . metFORMIN (GLUCOPHAGE-XR) 500 MG 24 hr tablet      TAKE TWO TABLETS BY MOUTH IN THE EVENING   180 tablet   0   . VICTOZA 18 MG/3ML SOPN      1.2mg  qd this week and 1.8mg  next week      3     Dispense as written.   Marland Kitchen ZETIA 10 MG tablet      TAKE ONE TABLET BY MOUTH ONCE DAILY   30 tablet   11     Allergies Shellfish allergy  Family History  Problem Relation Age of Onset  .  Arthritis Mother     RA  . Hypertension Mother   . Drug abuse Mother   . Depression Mother   . Cancer Sister     thyroid cancer, reoccured 2008  . Depression Sister   . Drug abuse Brother   . Stroke Maternal Grandmother   . Alzheimer's disease Maternal Grandmother   . Depression Sister   . Depression Sister     Social History Social History  Substance Use Topics  . Smoking status: Never Smoker   . Smokeless tobacco: Never Used  . Alcohol Use: No    Review of Systems Constitutional: Negative for fever. Cardiovascular: Negative for chest pain. Respiratory: Negative for shortness of breath. Gastrointestinal: Negative for abdominal pain Genitourinary: Negative for dysuria. Neurological: Negative for headache 10-point ROS otherwise negative.  ____________________________________________   PHYSICAL EXAM:  VITAL SIGNS: ED Triage Vitals  Enc Vitals Group     BP 03/07/16 1059 145/75 mmHg     Pulse Rate 03/07/16 1059 65     Resp 03/07/16 1059 18     Temp 03/07/16 1059 97.8 F (36.6 C)     Temp Source 03/07/16 1059 Oral     SpO2 03/07/16 1059 96 %     Weight 03/07/16 1059 209 lb (94.802 kg)     Height 03/07/16 1059 5\' 3"  (1.6 m)     Head Cir --      Peak Flow --      Pain Score 03/07/16 1101 0     Pain Loc --      Pain Edu? --      Excl. in [place]? --     Constitutional: Alert and oriented. Well appearing and in no distress. Eyes: Normal exam ENT   Head: Normocephalic and atraumatic.   Mouth/Throat: Mucous membranes are moist. Cardiovascular: Normal rate, regular rhythm. No murmur Respiratory: Normal respiratory effort without tachypnea nor retractions. Breath sounds are clear  Gastrointestinal: Soft and nontender. No distention.  Musculoskeletal: Nontender with normal range of motion in all extremities.  Neurologic:  Normal speech and language. No gross focal neurologic deficits. Equal grips bilaterally. No pronator drift. Cranial nerves intact. Skin:  Skin  is warm, dry and intact.  Psychiatric: Mood and affect are normal. Speech and behavior are normal.  ____________________________________________  EKG  EKG reviewed and interpreted by myself shows normal sinus rhythm at 74 bpm, narrow QRS, normal axis, normal intervals, nonspecific but no concerning ST changes.  ____________________________________________    INITIAL IMPRESSION / ASSESSMENT AND PLAN / ED COURSE  Pertinent labs & imaging results that were available during my care of the patient were reviewed by me and considered in my medical decision making (see chart for details).  The patient presents the emergency department with a vague symptoms of intermittent flushing and pulsating sensation throughout her body. Patient does note irregular periods lately, her symptoms are suggestive of possible perimenopausal symptoms. We will check labs, EKG, and closely monitor in the emergency department. Overall the patient appears well with a normal exam including normal neurologic exam.  Patient's labs have resulted within normal limits. Troponin is negative. EKG is reassuring. Overall the patient appears very well, we'll discharge home with primary care follow-up. Patient agreeable to plan.  ____________________________________________   FINAL CLINICAL IMPRESSION(S) / ED DIAGNOSES  Weakness   Harvest Dark, MD 03/07/16 [phone]

## 2016-03-07 NOTE — Telephone Encounter (Signed)
Pt called. Needs an appt asap for Hospital follow up. Next week you have no 30 min slots. Where would you like me to add her?   [phone]

## 2016-03-07 NOTE — ED Notes (Signed)
States that work is currently very stressful.  Has been having more frequent headaches recently and has been taking Excedrin Migraine.  Patient states she is feeling stressed and anxious.

## 2016-03-10 ENCOUNTER — Encounter: Payer: Self-pay | Admitting: Family Medicine

## 2016-03-10 ENCOUNTER — Ambulatory Visit (INDEPENDENT_AMBULATORY_CARE_PROVIDER_SITE_OTHER): Payer: BC Managed Care – PPO | Admitting: Family Medicine

## 2016-03-10 VITALS — BP 126/64 | HR 86 | Temp 98.1°F | Ht 64.0 in | Wt 208.5 lb

## 2016-03-10 DIAGNOSIS — I10 Essential (primary) hypertension: Secondary | ICD-10-CM

## 2016-03-10 DIAGNOSIS — IMO0001 Reserved for inherently not codable concepts without codable children: Secondary | ICD-10-CM

## 2016-03-10 DIAGNOSIS — E669 Obesity, unspecified: Secondary | ICD-10-CM

## 2016-03-10 DIAGNOSIS — Z794 Long term (current) use of insulin: Secondary | ICD-10-CM

## 2016-03-10 DIAGNOSIS — N951 Menopausal and female climacteric states: Secondary | ICD-10-CM

## 2016-03-10 DIAGNOSIS — E1165 Type 2 diabetes mellitus with hyperglycemia: Secondary | ICD-10-CM | POA: Diagnosis not present

## 2016-03-10 DIAGNOSIS — F4323 Adjustment disorder with mixed anxiety and depressed mood: Secondary | ICD-10-CM

## 2016-03-10 MED ORDER — PAROXETINE HCL 10 MG PO TABS: 10.0000 mg | ORAL_TABLET | Freq: Every day | ORAL | Status: DC

## 2016-03-10 NOTE — Patient Instructions (Addendum)
Start paxil 10 mg daily to help with mood and perimenopausal symptoms   For diet: try to avoid processed foods when you can and shop the outside of the grocery store  Eat 3 meals a day (or 6 mini meals)-with protein  Try to get in more green fresh vegetables  Goal eventually for exercise is to work up to 30 or more minutes 5 days per week - the more you get outdoors the better (when not too hot)  Being more social when you are ready is helpful  Reading or writing in a journal  Really think about what gives you joy and allow yourself to do some things just for you   See if you can start some counseling at work and let us know if you need a referral

## 2016-03-10 NOTE — Progress Notes (Signed)
Subjective:    Patient ID: KM. FRANCAVILLA, female    DOB: 29-Apr-1966, 50 y.o.   MRN: [id number]  HPI Here for f/u of ED visit on 03/07/16 Symptoms seen for included intermittent flushing/ bubbling sens in stomach and chest  Some funny feeling in her head and pulsating feeling in her body  This occurs for several minutes and then will re occur minutes to hours later   Irregular menses - started spotting on 6/9 (when she was at the hospital) Is now a full blown period   Some mood fluctuation too   Results as follows   Results for orders placed or performed during the hospital encounter of 03/07/16  CBC  Result Value Ref Range   WBC 8.9 3.6 - 11.0 K/uL   RBC 4.60 3.80 - 5.20 MIL/uL   Hemoglobin 13.0 12.0 - 16.0 g/dL   HCT 38.4 35.0 - 47.0 %   MCV 83.6 80.0 - 100.0 fL   MCH 28.3 26.0 - 34.0 pg   MCHC 33.8 32.0 - 36.0 g/dL   RDW 15.1 (H) 11.5 - 14.5 %   Platelets 241 150 - 440 K/uL  Comprehensive metabolic panel  Result Value Ref Range   Sodium 138 135 - 145 mmol/L   Potassium 3.9 3.5 - 5.1 mmol/L   Chloride 104 101 - 111 mmol/L   CO2 22 22 - 32 mmol/L   Glucose, Bld 130 (H) 65 - 99 mg/dL   BUN 14 6 - 20 mg/dL   Creatinine, Ser 0.77 0.44 - 1.00 mg/dL   Calcium 9.0 8.9 - 10.3 mg/dL   Total Protein 7.0 6.5 - 8.1 g/dL   Albumin 4.1 3.5 - 5.0 g/dL   AST 19 15 - 41 U/L   ALT 17 14 - 54 U/L   Alkaline Phosphatase 40 38 - 126 U/L   Total Bilirubin 0.4 0.3 - 1.2 mg/dL   GFR calc non Af Amer >60 >60 mL/min   GFR calc Af Amer >60 >60 mL/min   Anion gap 12 5 - 15  Troponin I  Result Value Ref Range   Troponin I <0.03 <0.031 ng/mL     Lab Results  Component Value Date   TSH 1.44 04/10/2015  mother and sister had thyroid cancer and removal of thyroid  Her endocrinologist will check it in Aug   bp was initially  145/75  Pt states when she has a flush her pulse and bp go up  BP Readings from Last 3 Encounters:  03/10/16 126/64  03/07/16 130/76  10/31/15 126/78  she has a  headache today   EKG- NSR with rate of 74 narrow QRS and no acute changes   Impression-? If this may be perimenopause   Wt Readings from Last 3 Encounters:  03/10/16 208 lb 8 oz (94.575 kg)  03/07/16 209 lb (94.802 kg)  10/31/15 212 lb 8 oz (96.389 kg)    Lab Results  Component Value Date   HGBA1C 9.2* 07/09/2015  was 6.8 last endo visit   Last visit to gyn was 2 y ago - recall in 5 years   Wants to take better care of herself this summer  Sometimes skips lunch  Cereal for dinner or chicken    Patient Active Problem List   Diagnosis Date Noted  . Perimenopause 03/10/2016  . Neck pain 10/16/2015  . Colon cancer screening 01/10/2015  . Routine general medical examination at a health care facility 01/02/2015  . Menorrhagia 06/14/2014  . Low back  pain 11/25/2013  . Abnormal EKG 01/08/2012  . Chest pressure 12/04/2011  . Adjustment disorder with mixed anxiety and depressed mood 06/24/2011  . Elevated transaminase level 01/21/2011  . PLANTAR FASCIITIS 10/02/2010  . Vitamin D deficiency 04/26/2010  . Essential hypertension 02/15/2009  . Diabetes mellitus type 2, uncontrolled ([place]) 09/07/2008  . Hyperlipidemia 09/07/2008  . Obesity 04/24/2008  . COMMON MIGRAINE 04/18/2008   Past Medical History  Diagnosis Date  . Hypertension   . Hyperlipidemia   . Diabetes mellitus     type II controlled  . Palpitations   . Obesity   . Migraine     common  . Vertigo   . Plantar fasciitis   . S/P tonsillectomy    Past Surgical History  Procedure Laterality Date  . Bladder surgery  1990    bladder tack after 2nd delivery   Social History  Substance Use Topics  . Smoking status: Never Smoker   . Smokeless tobacco: Never Used  . Alcohol Use: No   Family History  Problem Relation Age of Onset  . Arthritis Mother     RA  . Hypertension Mother   . Drug abuse Mother   . Depression Mother   . Cancer Sister     thyroid cancer, reoccured 2008  . Depression Sister   . Drug  abuse Brother   . Stroke Maternal Grandmother   . Alzheimer's disease Maternal Grandmother   . Depression Sister   . Depression Sister    Allergies  Allergen Reactions  . Shellfish Allergy    Current Outpatient Prescriptions on File Prior to Visit  Medication Sig Dispense Refill  . aspirin 81 MG tablet Take 81 mg by mouth daily.      Marland Kitchen atorvastatin (LIPITOR) 20 MG tablet TAKE ONE TABLET BY MOUTH ONCE DAILY 90 tablet 0  . cetirizine (ZYRTEC) 10 MG tablet TAKE ONE TABLET BY MOUTH EVERY DAY 30 tablet 2  . eletriptan (RELPAX) 40 MG tablet 1 tablet by mouth at onset of headache, repeat in 2 hours if not resolved. 10 tablet 5  . glucose blood (ONE TOUCH ULTRA TEST) test strip USE ONE STRIP TO CHECK GLUCOSE THREE TIMES DAILY TO 4 TIMES DAILY AS NEEDED Dx. E11.9 100 each 5  . Insulin Glargine (LANTUS SOLOSTAR) 100 UNIT/ML Solostar Pen Inject 70 Units into the skin daily at 10 pm. (Patient taking differently: Inject 60 Units into the skin daily at 10 pm. ) 15 mL 11  . lisinopril (PRINIVIL,ZESTRIL) 20 MG tablet TAKE ONE TABLET BY MOUTH ONCE DAILY 90 tablet 0  . metFORMIN (GLUCOPHAGE-XR) 500 MG 24 hr tablet TAKE TWO TABLETS BY MOUTH IN THE EVENING 180 tablet 0  . VICTOZA 18 MG/3ML SOPN 1.2mg  qd this week and 1.8mg  next week  3  . ZETIA 10 MG tablet TAKE ONE TABLET BY MOUTH ONCE DAILY 30 tablet 11  . [DISCONTINUED] ipratropium (ATROVENT) 0.03 % nasal spray Place 2 sprays into the nose 3 (three) times daily. 30 mL 2   No current facility-administered medications on file prior to visit.    Review of Systems Review of Systems  Constitutional: Negative for fever, appetite change, and unexpected weight change.  Eyes: Negative for pain and visual disturbance.  Respiratory: Negative for cough and shortness of breath.   Cardiovascular: Negative for cp or palpitations    Gastrointestinal: Negative for nausea, diarrhea and constipation.  Genitourinary: Negative for urgency and frequency. pos for irreg  menses and hot flushes  Skin: Negative for pallor  or rash   Neurological: Negative for weakness, light-headedness, numbness and headaches. MSK pos for aches and pains   Hematological: Negative for adenopathy. Does not bruise/bleed easily.  Psychiatric/Behavioral: pos for labile mood- depressed/anx and irritable , neg for SI        Objective:   Physical Exam  Constitutional: She appears well-developed and well-nourished. No distress.  obese and well appearing   HENT:  Head: Normocephalic and atraumatic.  Mouth/Throat: Oropharynx is clear and moist.  Eyes: Conjunctivae and EOM are normal. Pupils are equal, round, and reactive to light.  Neck: Normal range of motion. Neck supple. No JVD present. Carotid bruit is not present. No thyromegaly present.  Cardiovascular: Normal rate, regular rhythm, normal heart sounds and intact distal pulses.  Exam reveals no gallop.   Pulmonary/Chest: Effort normal and breath sounds normal. No respiratory distress. She has no wheezes. She has no rales.  No crackles  Abdominal: Soft. Bowel sounds are normal. She exhibits no distension, no abdominal bruit and no mass. There is no tenderness.  Musculoskeletal: She exhibits no edema.  Lymphadenopathy:    She has no cervical adenopathy.  Neurological: She is alert. She has normal reflexes.  Skin: Skin is warm and dry. No rash noted.  Psychiatric: Her speech is normal. Thought content normal. Her mood appears not anxious. Her affect is blunt. Her affect is not labile and not inappropriate. She is slowed. She is not agitated and not withdrawn. Thought content is not paranoid. She exhibits a depressed mood. She expresses no homicidal and no suicidal ideation.  Pt voices feelings of anhedonia  Not hopelessness No SI          Assessment & Plan:   Problem List Items Addressed This Visit      Cardiovascular and Mediastinum   Essential hypertension - Primary    bp in fair control at this time  BP Readings  from Last 1 Encounters:  03/10/16 126/64   No changes needed Disc lifstyle change with low sodium diet and exercise  This is occ labile during hot flushes-overall stable and came down in the ED        Endocrine   Diabetes mellitus type 2, uncontrolled ([facility])    A1C is improved with endocrinology visits to 6.8 Commended Rev low glycemic diet and also DASH eating plan-to eat cleaner and help her loose wt as well Outlined a plan for exercise as well        Other   Perimenopause    With vasomotor and mood symptoms as well as irreg menses Will try paxil for mood as well as flushing at low dose of 10 mg Discussed expectations of SSRI medication including time to effectiveness and mechanism of action, also poss of side effects (early and late)- including mental fuzziness, weight or appetite change, nausea and poss of worse dep or anxiety (even suicidal thoughts)  Pt voiced understanding and will stop med and update if this occurs   Rev her ED records in detail -reassuring Will have tsh check soon with endocrinologist >25 minutes spent in face to face time with patient, >50% spent in counselling or coordination of care Will disc further at f/u next mo       Obesity    Discussed how this problem influences overall health and the risks it imposes  Reviewed plan for weight loss with lower calorie diet (via better food choices and also portion control or program like weight watchers) and exercise building up to or more than  30 minutes 5 days per week including some aerobic activity   Disc plan for exercise and cleaner diet (DASH) She is motivated for better self care      Adjustment disorder with mixed anxiety and depressed mood    Now with more unpredictable mood swings -due to perimenopausal flux of hormones She is sometimes tearful and irritable In addition some anhedonia (w/o hopelessness or SI) She will begin counseling at work  Also px paxil to help this as well as perimenopausal  symptoms  Discussed expectations of SSRI medication including time to effectiveness and mechanism of action, also poss of side effects (early and late)- including mental fuzziness, weight or appetite change, nausea and poss of worse dep or anxiety (even suicidal thoughts)  Pt voiced understanding and will stop med and update if this occurs   Start at 10 mg  F/u in July

## 2016-03-10 NOTE — Progress Notes (Signed)
Pre visit review using our clinic review tool, if applicable. No additional management support is needed unless otherwise documented below in the visit note. 

## 2016-03-10 NOTE — Assessment & Plan Note (Signed)
Discussed how this problem influences overall health and the risks it imposes  Reviewed plan for weight loss with lower calorie diet (via better food choices and also portion control or program like weight watchers) and exercise building up to or more than 30 minutes 5 days per week including some aerobic activity   Disc plan for exercise and cleaner diet (DASH) She is motivated for better self care

## 2016-03-10 NOTE — Assessment & Plan Note (Signed)
Now with more unpredictable mood swings -due to perimenopausal flux of hormones She is sometimes tearful and irritable In addition some anhedonia (w/o hopelessness or SI) She will begin counseling at work  Also px paxil to help this as well as perimenopausal symptoms  Discussed expectations of SSRI medication including time to effectiveness and mechanism of action, also poss of side effects (early and late)- including mental fuzziness, weight or appetite change, nausea and poss of worse dep or anxiety (even suicidal thoughts)  Pt voiced understanding and will stop med and update if this occurs   Start at 10 mg  F/u in July

## 2016-03-10 NOTE — Assessment & Plan Note (Signed)
bp in fair control at this time  BP Readings from Last 1 Encounters:  03/10/16 126/64   No changes needed Disc lifstyle change with low sodium diet and exercise  This is occ labile during hot flushes-overall stable and came down in the ED

## 2016-03-10 NOTE — Assessment & Plan Note (Signed)
With vasomotor and mood symptoms as well as irreg menses Will try paxil for mood as well as flushing at low dose of 10 mg Discussed expectations of SSRI medication including time to effectiveness and mechanism of action, also poss of side effects (early and late)- including mental fuzziness, weight or appetite change, nausea and poss of worse dep or anxiety (even suicidal thoughts)  Pt voiced understanding and will stop med and update if this occurs   Rev her ED records in detail -reassuring Will have tsh check soon with endocrinologist >25 minutes spent in face to face time with patient, >50% spent in counselling or coordination of care Will disc further at f/u next mo

## 2016-03-10 NOTE — Assessment & Plan Note (Signed)
A1C is improved with endocrinology visits to 6.8 Commended Rev low glycemic diet and also DASH eating plan-to eat cleaner and help her loose wt as well Outlined a plan for exercise as well

## 2016-04-19 ENCOUNTER — Other Ambulatory Visit: Payer: Self-pay | Admitting: Family Medicine

## 2016-04-25 ENCOUNTER — Encounter: Payer: BLUE CROSS/BLUE SHIELD | Admitting: Family Medicine

## 2016-07-09 ENCOUNTER — Other Ambulatory Visit: Payer: Self-pay | Admitting: Family Medicine

## 2016-07-31 ENCOUNTER — Other Ambulatory Visit: Payer: Self-pay | Admitting: Otolaryngology

## 2016-07-31 DIAGNOSIS — H90A32 Mixed conductive and sensorineural hearing loss, unilateral, left ear with restricted hearing on the contralateral side: Secondary | ICD-10-CM

## 2016-07-31 DIAGNOSIS — H9201 Otalgia, right ear: Secondary | ICD-10-CM

## 2016-08-04 ENCOUNTER — Encounter: Payer: Self-pay | Admitting: Family Medicine

## 2016-08-06 ENCOUNTER — Ambulatory Visit (INDEPENDENT_AMBULATORY_CARE_PROVIDER_SITE_OTHER): Payer: BC Managed Care – PPO | Admitting: Family Medicine

## 2016-08-06 ENCOUNTER — Encounter: Payer: Self-pay | Admitting: Family Medicine

## 2016-08-06 VITALS — BP 122/80 | HR 74 | Temp 98.4°F | Ht 64.0 in | Wt 205.5 lb

## 2016-08-06 DIAGNOSIS — H9203 Otalgia, bilateral: Secondary | ICD-10-CM

## 2016-08-06 DIAGNOSIS — B029 Zoster without complications: Secondary | ICD-10-CM

## 2016-08-06 MED ORDER — TRAMADOL HCL 50 MG PO TABS: 50.0000 mg | ORAL_TABLET | Freq: Three times a day (TID) | ORAL | 0 refills | Status: DC | PRN

## 2016-08-06 MED ORDER — MUPIROCIN 2 % EX OINT: 1.0000 "application " | TOPICAL_OINTMENT | Freq: Two times a day (BID) | CUTANEOUS | 0 refills | Status: DC

## 2016-08-06 MED ORDER — VALACYCLOVIR HCL 1 G PO TABS: 1000.0000 mg | ORAL_TABLET | Freq: Three times a day (TID) | ORAL | 0 refills | Status: DC

## 2016-08-06 NOTE — Progress Notes (Signed)
Subjective:    Patient ID: Stacy Monroe, female    DOB: 08/09/1966, 50 y.o.   MRN: [id number]  HPI  Here for facial pain/ ear pain with rash on right  She was seen at [facility] clinic before rash began- was dx with OM and given amoxicillin  Lots of pain in ear and pressure in both ears - feels like she cannot pop her ears    Friday am both ears hurt (perhaps a boil in her L ear R ear- hurts  Now has rash along hairline over R eye and R side of face -was oozing  Face feels tender and puffy  Swollen ln in neck Temp 99.3 on 11/6  Her DM doctor thought it was a skin infection instead of shingles   Has sharp pains in R side of scalp with burning   Also dealing with hearing loss and has CT scheduled tomorrow with ref to [facility] (saw Dr Pryor Ochoa)  Told she had hole in eardrum healing- and 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 Ear pain, bilateral 08/07/2016  . Shingles 08/06/2016  . Perimenopause 03/10/2016  . Neck pain 10/16/2015  . Colon cancer screening 01/10/2015  . Routine general medical examination at a health care facility 01/02/2015  . Menorrhagia 06/14/2014  . Low back pain 11/25/2013  . Abnormal EKG 01/08/2012  . Chest pressure 12/04/2011  . Adjustment disorder with mixed anxiety and depressed mood 06/24/2011  . Elevated transaminase level 01/21/2011  . PLANTAR FASCIITIS 10/02/2010  . Vitamin D deficiency 04/26/2010  . Essential hypertension 02/15/2009  . Diabetes mellitus type 2, uncontrolled ([place]) 09/07/2008  . Hyperlipidemia 09/07/2008  . Obesity 04/24/2008  . COMMON MIGRAINE 04/18/2008   Past Medical History:  Diagnosis Date  . Diabetes mellitus    type II controlled  . Hyperlipidemia   . Hypertension   . Migraine    common  . Obesity   . Palpitations   . Plantar fasciitis   . S/P tonsillectomy   . Vertigo    Past Surgical History:  Procedure  Laterality Date  . BLADDER SURGERY  1990   bladder tack after 2nd delivery   Social History  Substance Use Topics  . Smoking status: Never Smoker  . Smokeless tobacco: Never Used  . Alcohol use No   Family History  Problem Relation Age of Onset  . Arthritis Mother     RA  . Hypertension Mother   . Drug abuse Mother   . Depression Mother   . Cancer Sister     thyroid cancer, reoccured 2008  . Depression Sister   . Drug abuse Brother   . Stroke Maternal Grandmother   . Alzheimer's disease Maternal Grandmother   . Depression Sister   . Depression Sister    Allergies  Allergen Reactions  . Shellfish Allergy    Current Outpatient Prescriptions on File Prior to Visit  Medication Sig Dispense Refill  . aspirin 81 MG tablet Take 81 mg by mouth daily.      Marland Kitchen atorvastatin (LIPITOR) 20 MG tablet TAKE ONE TABLET BY MOUTH ONCE DAILY 90 tablet 0  . cetirizine (ZYRTEC) 10 MG tablet TAKE ONE TABLET BY MOUTH EVERY DAY 30 tablet 2  . eletriptan (RELPAX) 40 MG tablet 1 tablet by mouth at onset of headache, repeat in 2 hours if not resolved. 10 tablet 5  .  glucose blood (ONE TOUCH ULTRA TEST) test strip USE ONE STRIP TO CHECK GLUCOSE THREE TIMES DAILY TO 4 TIMES DAILY AS NEEDED Dx. E11.9 100 each 5  . Insulin Glargine (LANTUS SOLOSTAR) 100 UNIT/ML Solostar Pen Inject 70 Units into the skin daily at 10 pm. (Patient taking differently: Inject 60 Units into the skin daily at 10 pm. ) 15 mL 11  . lisinopril (PRINIVIL,ZESTRIL) 20 MG tablet TAKE ONE TABLET BY MOUTH ONCE DAILY 90 tablet 0  . metFORMIN (GLUCOPHAGE-XR) 500 MG 24 hr tablet TAKE TWO TABLETS BY MOUTH IN THE EVENING 180 tablet 0  . PARoxetine (PAXIL) 10 MG tablet Take 1 tablet (10 mg total) by mouth daily. 30 tablet 11  . VICTOZA 18 MG/3ML SOPN 1.2mg  qd this week and 1.8mg  next week  3  . [DISCONTINUED] ipratropium (ATROVENT) 0.03 % nasal spray Place 2 sprays into the nose 3 (three) times daily. 30 mL 2   No current facility-administered  medications on file prior to visit.     Review of Systems Review of Systems  Constitutional: Negative for fever, appetite change, fatigue and unexpected weight change.  Eyes: Negative for pain and visual disturbance. Pos for discomfort of R eye - gritty feeling  ENT pos for bilat ear pain and pressure/ discomfort of L canal as well  Respiratory: Negative for cough and shortness of breath.   Cardiovascular: Negative for cp or palpitations    Gastrointestinal: Negative for nausea, diarrhea and constipation.  Genitourinary: Negative for urgency and frequency.  Skin: Negative for pallor and pos for rash of R side of head and pain surrounding it  Neurological: Negative for weakness, light-headedness, numbness and headaches.  Hematological: Negative for adenopathy. Does not bruise/bleed easily.  Psychiatric/Behavioral: Negative for dysphoric mood. The patient is not nervous/anxious.         Objective:   Physical Exam  Constitutional: She appears well-developed and well-nourished. No distress.  obese and well appearing   HENT:  Head: Normocephalic and atraumatic.  Mouth/Throat: Oropharynx is clear and moist. No oropharyngeal exudate.  Nares are boggy TMs are dull bilaterally  No lesion seen in L ear canal (canal does curve significantly)-no swelling  Rash noted at hairline on R side -see skin exam   Some flushing of R cheek   Eyes: Conjunctivae and EOM are normal. Pupils are equal, round, and reactive to light. Right eye exhibits no discharge. Left eye exhibits no discharge. No scleral icterus.  No active drainage or conj change in R eye noted on exam  Neck: Normal range of motion. Neck supple.  Few post and ant cervical LN -slt tender on the R  Cardiovascular: Normal rate and regular rhythm.   Pulmonary/Chest: Effort normal and breath sounds normal. No respiratory distress. She has no wheezes. She has no rales.  Lymphadenopathy:    She has cervical adenopathy.  Neurological: She is  alert. She has normal reflexes. No cranial nerve deficit. She exhibits normal muscle tone. Coordination normal.  Skin: Skin is warm and dry. Rash noted. There is erythema.  Vesicular rash noted at hairline R side of head-crusted over with one fresh vesicle No active drainage or weeping and no pustules Tender to the touch Scant erythema surrounding   Psychiatric: She has a normal mood and affect.          Assessment & Plan:   Problem List Items Addressed This Visit      Other   Ear pain, bilateral    With ETD bilat Pt also  has feeling of a skin bump in L canal- could not see on exam-given Bactroban to use prn  F/u with ENT and for CT as planned       Shingles    R side of face /scalp with small area of vesicular rash at hair line and pain distribution over mostly V1 She has feeling of discomfort in R eye (no rash)- will still ref to ophy urgently for this  Start valtrex 1 g tid  Tramadol for pain with caution of sedation  bactroban for the scabs  Handout on shingles given  inst to avoid immunocompromised people  Will keep posted       Relevant Medications   valACYclovir (VALTREX) 1000 MG tablet   mupirocin ointment (BACTROBAN) 2 %   Other Relevant Orders   Ambulatory referral to Ophthalmology

## 2016-08-06 NOTE — Progress Notes (Signed)
Pre visit review using our clinic review tool, if applicable. No additional management support is needed unless otherwise documented below in the visit note. 

## 2016-08-06 NOTE — Patient Instructions (Addendum)
I think you may have shingles  Take the valtrex as directed  Use the bactroban ointment on the bumps/scabbed areas  You can also use a bit in your ear  Try the tramadol with caution for pain  Avoid immunocompromised people   Get your CT scan tomorrow as planned  Keep [place] posted   I want to get your eye checked out also  Stop at check out for referral to opthy

## 2016-08-07 ENCOUNTER — Ambulatory Visit
Admission: RE | Admit: 2016-08-07 | Discharge: 2016-08-07 | Disposition: A | Discharge status: Home / Self Care | Payer: BC Managed Care – PPO | Source: Ambulatory Visit | Attending: Otolaryngology | Admitting: Otolaryngology

## 2016-08-07 DIAGNOSIS — H90A32 Mixed conductive and sensorineural hearing loss, unilateral, left ear with restricted hearing on the contralateral side: Secondary | ICD-10-CM | POA: Insufficient documentation

## 2016-08-07 DIAGNOSIS — H9201 Otalgia, right ear: Secondary | ICD-10-CM | POA: Diagnosis not present

## 2016-08-07 DIAGNOSIS — H9203 Otalgia, bilateral: Secondary | ICD-10-CM | POA: Insufficient documentation

## 2016-08-07 NOTE — Assessment & Plan Note (Signed)
With ETD bilat Pt also has feeling of a skin bump in L canal- could not see on exam-given Bactroban to use prn  F/u with ENT and for CT as planned

## 2016-08-07 NOTE — Assessment & Plan Note (Signed)
R side of face /scalp with small area of vesicular rash at hair line and pain distribution over mostly V1 She has feeling of discomfort in R eye (no rash)- will still ref to ophy urgently for this  Start valtrex 1 g tid  Tramadol for pain with caution of sedation  bactroban for the scabs  Handout on shingles given  inst to avoid immunocompromised people  Will keep posted

## 2016-12-24 ENCOUNTER — Encounter: Payer: Self-pay | Admitting: Surgical

## 2016-12-24 ENCOUNTER — Telehealth: Payer: Self-pay | Admitting: Surgical

## 2016-12-24 ENCOUNTER — Ambulatory Visit: Payer: BC Managed Care – PPO | Admitting: Internal Medicine

## 2016-12-24 ENCOUNTER — Ambulatory Visit (INDEPENDENT_AMBULATORY_CARE_PROVIDER_SITE_OTHER): Payer: BC Managed Care – PPO | Admitting: Family Medicine

## 2016-12-24 ENCOUNTER — Encounter: Payer: Self-pay | Admitting: Family Medicine

## 2016-12-24 VITALS — BP 116/72 | HR 85 | Temp 98.0°F | Wt 208.6 lb

## 2016-12-24 DIAGNOSIS — R05 Cough: Secondary | ICD-10-CM | POA: Diagnosis not present

## 2016-12-24 DIAGNOSIS — R059 Cough, unspecified: Secondary | ICD-10-CM

## 2016-12-24 MED ORDER — HYDROCODONE-HOMATROPINE 5-1.5 MG/5ML PO SYRP: 5.0000 mL | ORAL_SOLUTION | Freq: Every evening | ORAL | 0 refills | Status: DC | PRN

## 2016-12-24 MED ORDER — ALBUTEROL SULFATE (2.5 MG/3ML) 0.083% IN NEBU
2.5000 mg | INHALATION_SOLUTION | Freq: Once | RESPIRATORY_TRACT | Status: AC
  Administered 2016-12-24: 2.5 mg via RESPIRATORY_TRACT

## 2016-12-24 MED ORDER — ALBUTEROL SULFATE HFA 108 (90 BASE) MCG/ACT IN AERS: 2.0000 | INHALATION_SPRAY | Freq: Four times a day (QID) | RESPIRATORY_TRACT | 2 refills | Status: AC | PRN

## 2016-12-24 MED ORDER — DOXYCYCLINE HYCLATE 100 MG PO TABS: 100.0000 mg | ORAL_TABLET | Freq: Two times a day (BID) | ORAL | 0 refills | Status: DC

## 2016-12-24 NOTE — Progress Notes (Signed)
Stacy Monroe is a 51 y.o. female here for a new problem.  History of Present Illness:   Stacy Monroe CMA acting as scribe for Dr. Juleen China  Chief Complaint  Patient presents with  . Cough  . Sore Throat   Cough  This is a new problem. The current episode started 1 to 4 weeks ago. The problem has been gradually worsening. The cough is productive of purulent sputum. Associated symptoms include chest pain, chills, ear pain, a fever, headaches, nasal congestion, a sore throat, shortness of breath and wheezing. She has tried OTC cough suppressant for the symptoms. The treatment provided mild relief.   PMHx, SurgHx, SocialHx, Medications, and Allergies were reviewed in the Visit Navigator and updated as appropriate.  Current Medications:   Current Outpatient Prescriptions:  .  aspirin 81 MG tablet, Take 81 mg by mouth daily.  , Disp: , Rfl:  .  atorvastatin (LIPITOR) 20 MG tablet, TAKE ONE TABLET BY MOUTH ONCE DAILY, Disp: 90 tablet, Rfl: 0 .  cetirizine (ZYRTEC) 10 MG tablet, TAKE ONE TABLET BY MOUTH EVERY DAY, Disp: 30 tablet, Rfl: 2 .  eletriptan (RELPAX) 40 MG tablet, 1 tablet by mouth at onset of headache, repeat in 2 hours if not resolved., Disp: 10 tablet, Rfl: 5 .  glucose blood (ONE TOUCH ULTRA TEST) test strip, USE ONE STRIP TO CHECK GLUCOSE THREE TIMES DAILY TO 4 TIMES DAILY AS NEEDED Dx. E11.9, Disp: 100 each, Rfl: 5 .  Insulin Glargine (LANTUS SOLOSTAR) 100 UNIT/ML Solostar Pen, Inject 70 Units into the skin daily at 10 pm. (Patient taking differently: Inject 60 Units into the skin daily at 10 pm. ), Disp: 15 mL, Rfl: 11 .  lisinopril (PRINIVIL,ZESTRIL) 20 MG tablet, TAKE ONE TABLET BY MOUTH ONCE DAILY, Disp: 90 tablet, Rfl: 0 .  metFORMIN (GLUCOPHAGE-XR) 500 MG 24 hr tablet, TAKE TWO TABLETS BY MOUTH IN THE EVENING, Disp: 180 tablet, Rfl: 0 .  mupirocin ointment (BACTROBAN) 2 %, Place 1 application into the nose 2 (two) times daily. To affected areas, Disp: 22 g, Rfl: 0 .   PARoxetine (PAXIL) 10 MG tablet, Take 1 tablet (10 mg total) by mouth daily., Disp: 30 tablet, Rfl: 11 .  traMADol (ULTRAM) 50 MG tablet, Take 1 tablet (50 mg total) by mouth every 8 (eight) hours as needed. With caution of sedation, Disp: 30 tablet, Rfl: 0 .  valACYclovir (VALTREX) 1000 MG tablet, Take 1 tablet (1,000 mg total) by mouth 3 (three) times daily., Disp: 21 tablet, Rfl: 0 .  VICTOZA 18 MG/3ML SOPN, 1.2mg  qd this week and 1.8mg  next week, Disp: , Rfl: 3    Review of Systems:   Review of Systems  Constitutional: Positive for chills, fever and malaise/fatigue.  HENT: Positive for congestion, ear pain and sore throat.   Eyes: Negative for blurred vision and double vision.  Respiratory: Positive for cough, shortness of breath and wheezing.   Cardiovascular: Positive for chest pain and palpitations. Negative for leg swelling.       Chest pain with cough only.  Gastrointestinal: Positive for diarrhea. Negative for abdominal pain and vomiting.  Genitourinary: Negative for dysuria.  Musculoskeletal: Positive for back pain. Negative for neck pain.       Chronic back pain.  Neurological: Positive for headaches.  Psychiatric/Behavioral: Negative for hallucinations and memory loss.    Vitals:   Vitals:   12/24/16 1055  BP: 116/72  Pulse: 85  Temp: 98 F (36.7 C)  TempSrc: Oral  SpO2: 96%  Weight: 208 lb 9.6 oz (94.6 kg)     Body mass index is 35.81 kg/m.  Physical Exam:   Physical Exam  Constitutional: She appears well-nourished.  HENT:  Head: Normocephalic and atraumatic.  Eyes: EOM are normal. Pupils are equal, round, and reactive to light.  Neck: Normal range of motion. Neck supple.  Cardiovascular: Normal rate, regular rhythm, normal heart sounds and intact distal pulses.   Pulmonary/Chest: Effort normal. She has wheezes. She has rhonchi in the right middle field and the left middle field.  Abdominal: Soft.  Skin: Skin is warm.  Psychiatric: She has a normal mood  and affect. Her behavior is normal.  Nursing note and vitals reviewed.   Assessment and Plan:    Stacy Monroe was seen today for cough and sore throat.  Diagnoses and all orders for this visit:  Cough, concern for bronchoPNA -     albuterol (PROVENTIL) (2.5 MG/3ML) 0.083% nebulizer solution 2.5 mg; Take 3 mLs (2.5 mg total) by nebulization once. -     doxycycline (VIBRA-TABS) 100 MG tablet; Take 1 tablet (100 mg total) by mouth 2 (two) times daily. -     HYDROcodone-homatropine (HYCODAN) 5-1.5 MG/5ML syrup; Take 5 mLs by mouth at bedtime as needed for cough. -     albuterol (PROVENTIL HFA;VENTOLIN HFA) 108 (90 Base) MCG/ACT inhaler; Inhale 2 puffs into the lungs every 6 (six) hours as needed for wheezing or shortness of breath.  . Reviewed expectations re: course of current medical issues. . Discussed self-management of symptoms. . Outlined signs and symptoms indicating need for more acute intervention. . Patient verbalized understanding and all questions were answered. . See orders for this visit as documented in the electronic medical record. . Patient received an After-Visit Summary.  CMA served as Education administrator during this visit. History, Physical, and Plan performed by medical provider. Documentation and orders reviewed and attested to. Briscoe Deutscher, D.O.   Briscoe Deutscher, D.O.

## 2016-12-24 NOTE — Progress Notes (Signed)
Pre visit review using our clinic review tool, if applicable. No additional management support is needed unless otherwise documented below in the visit note. 

## 2016-12-24 NOTE — Telephone Encounter (Signed)
Spoke with pharmacy and gave verbal to change Ventolin to Proair. Insurance would not cover the ventolin

## 2017-01-05 ENCOUNTER — Other Ambulatory Visit: Payer: Self-pay | Admitting: *Deleted

## 2017-01-23 ENCOUNTER — Ambulatory Visit (INDEPENDENT_AMBULATORY_CARE_PROVIDER_SITE_OTHER): Payer: BC Managed Care – PPO | Admitting: Family Medicine

## 2017-01-23 ENCOUNTER — Encounter: Payer: Self-pay | Admitting: Family Medicine

## 2017-01-23 VITALS — BP 114/62 | HR 79 | Temp 98.1°F | Ht 64.0 in | Wt 210.5 lb

## 2017-01-23 DIAGNOSIS — R058 Other specified cough: Secondary | ICD-10-CM

## 2017-01-23 DIAGNOSIS — R05 Cough: Secondary | ICD-10-CM

## 2017-01-23 MED ORDER — BENZONATATE 200 MG PO CAPS: 200.0000 mg | ORAL_CAPSULE | Freq: Three times a day (TID) | ORAL | 1 refills | Status: DC | PRN

## 2017-01-23 NOTE — Progress Notes (Signed)
Subjective:    Patient ID: Stacy Monroe, female    DOB: 07/02/1966, 51 y.o.   MRN: [id number]  HPI Here for f/u of acute bronchitis   Seen in late march by Dr Juleen China Given albuterol and doxycycline and hycodan for her symptoms  Pulse ox is 97% on RA today   Generally improved  Still has a cough - and chest hurts (very sore)  At times sore to the tough (esp if coughing a lot)  Worse on the R side  Sore sometimes to take a deep breath   Now cough is dry-no longer productive   Some sob - on and off  Wheezing has improved however   No headache  Nasal symptoms are better   No cough med - finished hycodan  (took at night)   Patient Active Problem List   Diagnosis Date Noted  . Post-viral cough syndrome 01/23/2017  . Ear pain, bilateral 08/07/2016  . Shingles 08/06/2016  . Perimenopause 03/10/2016  . Neck pain 10/16/2015  . Colon cancer screening 01/10/2015  . Routine general medical examination at a health care facility 01/02/2015  . Menorrhagia 06/14/2014  . Low back pain 11/25/2013  . Abnormal EKG 01/08/2012  . Chest pressure 12/04/2011  . Adjustment disorder with mixed anxiety and depressed mood 06/24/2011  . Elevated transaminase level 01/21/2011  . PLANTAR FASCIITIS 10/02/2010  . Vitamin D deficiency 04/26/2010  . Essential hypertension 02/15/2009  . Diabetes mellitus type 2, uncontrolled ([place]) 09/07/2008  . Hyperlipidemia 09/07/2008  . Obesity 04/24/2008  . COMMON MIGRAINE 04/18/2008   Past Medical History:  Diagnosis Date  . Diabetes mellitus    type II controlled  . Hyperlipidemia   . Hypertension   . Migraine    common  . Obesity   . Palpitations   . Plantar fasciitis   . S/P tonsillectomy   . Vertigo    Past Surgical History:  Procedure Laterality Date  . BLADDER SURGERY  1990   bladder tack after 2nd delivery   Social History  Substance Use Topics  . Smoking status: Never Smoker  . Smokeless tobacco: Never Used  . Alcohol use No    Family History  Problem Relation Age of Onset  . Arthritis Mother     RA  . Hypertension Mother   . Drug abuse Mother   . Depression Mother   . Cancer Sister     thyroid cancer, reoccured 2008  . Depression Sister   . Drug abuse Brother   . Stroke Maternal Grandmother   . Alzheimer's disease Maternal Grandmother   . Depression Sister   . Depression Sister    Allergies  Allergen Reactions  . Shellfish Allergy    Current Outpatient Prescriptions on File Prior to Visit  Medication Sig Dispense Refill  . albuterol (PROVENTIL HFA;VENTOLIN HFA) 108 (90 Base) MCG/ACT inhaler Inhale 2 puffs into the lungs every 6 (six) hours as needed for wheezing or shortness of breath. 1 Inhaler 2  . aspirin 81 MG tablet Take 81 mg by mouth daily.      Marland Kitchen atorvastatin (LIPITOR) 20 MG tablet TAKE ONE TABLET BY MOUTH ONCE DAILY 90 tablet 0  . cetirizine (ZYRTEC) 10 MG tablet TAKE ONE TABLET BY MOUTH EVERY DAY 30 tablet 2  . eletriptan (RELPAX) 40 MG tablet 1 tablet by mouth at onset of headache, repeat in 2 hours if not resolved. 10 tablet 5  . glucose blood (ONE TOUCH ULTRA TEST) test strip USE ONE STRIP TO CHECK  GLUCOSE THREE TIMES DAILY TO 4 TIMES DAILY AS NEEDED Dx. E11.9 100 each 5  . Insulin Glargine (LANTUS SOLOSTAR) 100 UNIT/ML Solostar Pen Inject 70 Units into the skin daily at 10 pm. (Patient taking differently: Inject 60 Units into the skin daily at 10 pm. ) 15 mL 11  . lisinopril (PRINIVIL,ZESTRIL) 20 MG tablet TAKE ONE TABLET BY MOUTH ONCE DAILY 90 tablet 0  . metFORMIN (GLUCOPHAGE-XR) 500 MG 24 hr tablet TAKE TWO TABLETS BY MOUTH IN THE EVENING 180 tablet 0  . PARoxetine (PAXIL) 10 MG tablet Take 1 tablet (10 mg total) by mouth daily. 30 tablet 11  . VICTOZA 18 MG/3ML SOPN 1.2mg  qd this week and 1.8mg  next week  3  . [DISCONTINUED] ipratropium (ATROVENT) 0.03 % nasal spray Place 2 sprays into the nose 3 (three) times daily. 30 mL 2   No current facility-administered medications on file  prior to visit.      Review of Systems Review of Systems  Constitutional: Negative for fever, appetite change, fatigue and unexpected weight change.  Eyes: Negative for pain and visual disturbance.  Respiratory: pos for cough and shortness of breath.  (improved but lingering) , pos for chest wall soreness Cardiovascular: Negative for cp or palpitations    Gastrointestinal: Negative for nausea, diarrhea and constipation.  Genitourinary: Negative for urgency and frequency.  Skin: Negative for pallor or rash   Neurological: Negative for weakness, light-headedness, numbness and headaches.  Hematological: Negative for adenopathy. Does not bruise/bleed easily.  Psychiatric/Behavioral: Negative for dysphoric mood. The patient is not nervous/anxious.         Objective:   Physical Exam  Constitutional: She appears well-developed and well-nourished. No distress.  obese and well appearing   HENT:  Head: Normocephalic and atraumatic.  Nose: Nose normal.  Mouth/Throat: Oropharynx is clear and moist.  pnd noted  Eyes: Conjunctivae and EOM are normal. Pupils are equal, round, and reactive to light. Right eye exhibits no discharge. Left eye exhibits no discharge. No scleral icterus.  Neck: Normal range of motion. Neck supple.  Cardiovascular: Normal rate and regular rhythm.   Pulmonary/Chest: Effort normal and breath sounds normal. No respiratory distress. She has no wheezes. She has no rales. She exhibits tenderness.  CTA Good air exch  No rales or rhonchi or wheezes  Some mild R sided chest wall tenderness w/o crepitus   Lymphadenopathy:    She has no cervical adenopathy.  Skin: Skin is warm and dry. No rash noted. No erythema.  Psychiatric: She has a normal mood and affect.          Assessment & Plan:   Problem List Items Addressed This Visit      Respiratory   Post-viral cough syndrome    After a treated bout of bronchitis Cough is causing more chest soreness Will more  aggressively treat cough  Tessalon 200 mg tid mucinex dm otc bid  For qhs- hycodan cough med with caution of sedation (she still has at home)  Rest/fluids Update if not starting to improve in a week or if worsening   Watch for phlegm/fever or worse symptoms

## 2017-01-23 NOTE — Assessment & Plan Note (Signed)
After a treated bout of bronchitis Cough is causing more chest soreness Will more aggressively treat cough  Tessalon 200 mg tid mucinex dm otc bid  For qhs- hycodan cough med with caution of sedation (she still has at home)  Rest/fluids Update if not starting to improve in a week or if worsening   Watch for phlegm/fever or worse symptoms

## 2017-01-23 NOTE — Progress Notes (Signed)
Pre visit review using our clinic review tool, if applicable. No additional management support is needed unless otherwise documented below in the visit note. 

## 2017-01-23 NOTE — Patient Instructions (Signed)
I think you have a post viral cough syndrome and the cough is worsening the chest discomfort  Drink lots of fluids Get mucinex DM and take it as directed Use the tessalon cough pills also three times daily  For bedtime -use hycodan cough syrup for 5-7 days   Breathe steam  Avoid excessive pollen  Update if not starting to improve in a week or if worsening  (especially if fever or worse cough or phlegm)

## 2017-02-10 ENCOUNTER — Encounter: Payer: Self-pay | Admitting: Family Medicine

## 2017-03-28 ENCOUNTER — Other Ambulatory Visit: Payer: Self-pay | Admitting: Family Medicine

## 2017-03-30 NOTE — Telephone Encounter (Signed)
Pt has had a few recent acute appts but no recent or future f/ or CPE, please advise

## 2017-03-30 NOTE — Telephone Encounter (Signed)
Will refill electronically  

## 2017-04-13 ENCOUNTER — Encounter: Payer: Self-pay | Admitting: Family Medicine

## 2017-04-13 MED ORDER — FLUCONAZOLE 150 MG PO TABS: 150.0000 mg | ORAL_TABLET | Freq: Once | ORAL | 0 refills | Status: AC

## 2017-06-26 ENCOUNTER — Ambulatory Visit (INDEPENDENT_AMBULATORY_CARE_PROVIDER_SITE_OTHER): Payer: BC Managed Care – PPO | Admitting: Family Medicine

## 2017-06-26 ENCOUNTER — Encounter: Payer: Self-pay | Admitting: Family Medicine

## 2017-06-26 ENCOUNTER — Ambulatory Visit: Payer: Self-pay | Admitting: Family Medicine

## 2017-06-26 VITALS — BP 130/78 | HR 68 | Temp 98.0°F | Ht 64.0 in | Wt 205.5 lb

## 2017-06-26 DIAGNOSIS — J302 Other seasonal allergic rhinitis: Secondary | ICD-10-CM | POA: Diagnosis not present

## 2017-06-26 DIAGNOSIS — I10 Essential (primary) hypertension: Secondary | ICD-10-CM | POA: Diagnosis not present

## 2017-06-26 DIAGNOSIS — G43009 Migraine without aura, not intractable, without status migrainosus: Secondary | ICD-10-CM

## 2017-06-26 DIAGNOSIS — F4323 Adjustment disorder with mixed anxiety and depressed mood: Secondary | ICD-10-CM | POA: Diagnosis not present

## 2017-06-26 DIAGNOSIS — J309 Allergic rhinitis, unspecified: Secondary | ICD-10-CM | POA: Insufficient documentation

## 2017-06-26 MED ORDER — FLUTICASONE PROPIONATE 50 MCG/ACT NA SUSP: 2.0000 | Freq: Every day | NASAL | 11 refills | Status: DC

## 2017-06-26 MED ORDER — SUMATRIPTAN SUCCINATE 100 MG PO TABS: 100.0000 mg | ORAL_TABLET | Freq: Once | ORAL | 11 refills | Status: DC

## 2017-06-26 MED ORDER — PAROXETINE HCL 20 MG PO TABS: 20.0000 mg | ORAL_TABLET | Freq: Every day | ORAL | 11 refills | Status: DC

## 2017-06-26 NOTE — Patient Instructions (Addendum)
Use flonase daily  Increase water to 64 oz a day if you can  Try hard to get your 7 hours of sleep if you can  Increase paxil to 20 mg once daily  Take time for yourself if you can   Alert [place] if no improvement

## 2017-06-26 NOTE — Progress Notes (Signed)
Subjective:    Patient ID: Stacy Monroe, female    DOB: 1966-09-06, 51 y.o.   MRN: [id number]  HPI  Here for c/o of anxiety and headaches   Wt Readings from Last 3 Encounters:  06/26/17 205 lb 8 oz (93.2 kg)  01/23/17 210 lb 8 oz (95.5 kg)  12/24/16 208 lb 9.6 oz (94.6 kg)   35.27 kg/m   Is back to school  Also working  Lot of stress/pressure --- mostly from school)  4 migraines in the past week   Sleep- 5-6 hours (7 is what she needs)  Appetite -she eats but nothing ever sounds good to her - cold foods appeal to her and portions are smaller  Not drinking enough water  Menopause (july was her last menses) - occ gets hot / nothing really significant -thinks she is doing well   She does stay congested and her ears feel pressure  No colored nasal d/c or fever  More headaches -no aura/ occ more severe  Sometimes starting in sinus area    Mood- feels very overwhelmed (moreso at work) 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 Ear pain, bilateral 08/07/2016  . Shingles 08/06/2016  . Perimenopause 03/10/2016  . Neck pain 10/16/2015  . Colon cancer screening 01/10/2015  . Routine general medical examination at a health care facility 01/02/2015  . Menorrhagia 06/14/2014  . Low back pain 11/25/2013  . Abnormal EKG 01/08/2012  . Chest pressure 12/04/2011  . Adjustment  disorder with mixed anxiety and depressed mood 06/24/2011  . Elevated transaminase level 01/21/2011  . PLANTAR FASCIITIS 10/02/2010  . Vitamin D deficiency 04/26/2010  . Essential hypertension 02/15/2009  . Diabetes mellitus type 2, uncontrolled ([place]) 09/07/2008  . Hyperlipidemia 09/07/2008  . Obesity 04/24/2008  . Migraine without aura 04/18/2008   Past Medical History:  Diagnosis Date  . Diabetes mellitus    type II controlled  . Hyperlipidemia   . Hypertension   . Migraine    common  . Obesity   . Palpitations   . Plantar fasciitis   . S/P tonsillectomy   . Vertigo    Past Surgical History:  Procedure Laterality Date  . BLADDER SURGERY  1990   bladder tack after 2nd delivery   Social History  Substance Use Topics  . Smoking status: Never Smoker  . Smokeless tobacco: Never Used  . Alcohol use No   Family History  Problem Relation Age of Onset  . Arthritis Mother        RA  . Hypertension Mother   . Drug abuse Mother   . Depression Mother   . Cancer Sister        thyroid cancer, reoccured 2008  . Depression Sister   .  Drug abuse Brother   . Stroke Maternal Grandmother   . Alzheimer's disease Maternal Grandmother   . Depression Sister   . Depression Sister    Allergies  Allergen Reactions  . Shellfish Allergy    Current Outpatient Prescriptions on File Prior to Visit  Medication Sig Dispense Refill  . albuterol (PROVENTIL HFA;VENTOLIN HFA) 108 (90 Base) MCG/ACT inhaler Inhale 2 puffs into the lungs every 6 (six) hours as needed for wheezing or shortness of breath. 1 Inhaler 2  . aspirin 81 MG tablet Take 81 mg by mouth daily.      Marland Kitchen atorvastatin (LIPITOR) 20 MG tablet TAKE ONE TABLET BY MOUTH ONCE DAILY 90 tablet 0  . benzonatate (TESSALON) 200 MG capsule Take 1 capsule (200 mg total) by mouth 3 (three) times daily as needed. Do not bite pill (swallow whole) 30 capsule 1  . cetirizine (ZYRTEC) 10 MG tablet TAKE ONE TABLET BY MOUTH EVERY DAY 30 tablet 2  .  eletriptan (RELPAX) 40 MG tablet 1 tablet by mouth at onset of headache, repeat in 2 hours if not resolved. 10 tablet 5  . glucose blood (ONE TOUCH ULTRA TEST) test strip USE ONE STRIP TO CHECK GLUCOSE THREE TIMES DAILY TO 4 TIMES DAILY AS NEEDED Dx. E11.9 100 each 5  . lisinopril (PRINIVIL,ZESTRIL) 20 MG tablet TAKE ONE TABLET BY MOUTH ONCE DAILY 90 tablet 0  . metFORMIN (GLUCOPHAGE-XR) 500 MG 24 hr tablet TAKE TWO TABLETS BY MOUTH IN THE EVENING 180 tablet 0  . VICTOZA 18 MG/3ML SOPN 1.2mg  qd this week and 1.8mg  next week  3  . Insulin Glargine (LANTUS SOLOSTAR) 100 UNIT/ML Solostar Pen Inject 70 Units into the skin daily at 10 pm. (Patient not taking: Reported on 06/26/2017) 15 mL 11  . [DISCONTINUED] ipratropium (ATROVENT) 0.03 % nasal spray Place 2 sprays into the nose 3 (three) times daily. 30 mL 2   No current facility-administered medications on file prior to visit.     Review of Systems  Constitutional: Positive for fatigue. Negative for activity change, appetite change, fever and unexpected weight change.  HENT: Positive for congestion and ear pain. Negative for ear discharge, rhinorrhea, sinus pressure and sore throat.   Eyes: Negative for pain, redness and visual disturbance.  Respiratory: Negative for cough, shortness of breath and wheezing.   Cardiovascular: Negative for chest pain and palpitations.  Gastrointestinal: Negative for abdominal pain, blood in stool, constipation and diarrhea.  Endocrine: Negative for polydipsia and polyuria.  Genitourinary: Negative for dysuria, frequency and urgency.  Musculoskeletal: Negative for arthralgias, back pain and myalgias.  Skin: Negative for pallor and rash.  Allergic/Immunologic: Negative for environmental allergies.  Neurological: Positive for headaches. Negative for dizziness, tremors, syncope, facial asymmetry, light-headedness and numbness.  Hematological: Negative for adenopathy. Does not bruise/bleed easily.    Psychiatric/Behavioral: Negative for decreased concentration and dysphoric mood. The patient is not nervous/anxious.        Objective:   Physical Exam  Constitutional: She is oriented to person, place, and time. She appears well-developed and well-nourished. No distress.  obese and well appearing   HENT:  Head: Normocephalic and atraumatic.  Right Ear: External ear normal.  Left Ear: External ear normal.  Nose: Nose normal.  Mouth/Throat: Oropharynx is clear and moist. No oropharyngeal exudate.  No sinus tenderness No temporal tenderness  No TMJ tenderness Nares are injected and congested  TMs are dull    Eyes: Pupils are equal, round, and reactive to light. Conjunctivae and EOM are normal.  Right eye exhibits no discharge. Left eye exhibits no discharge. No scleral icterus.  No nystagmus  Neck: Normal range of motion and full passive range of motion without pain. Neck supple. No JVD present. Carotid bruit is not present. No tracheal deviation present. No thyromegaly present.  Cardiovascular: Normal rate, regular rhythm and normal heart sounds.   No murmur heard. Pulmonary/Chest: Effort normal and breath sounds normal. No respiratory distress. She has no wheezes. She has no rales.  Abdominal: Soft. Bowel sounds are normal. She exhibits no distension and no mass. There is no tenderness.  Musculoskeletal: She exhibits no edema or tenderness.  Lymphadenopathy:    She has no cervical adenopathy.  Neurological: She is alert and oriented to person, place, and time. She has normal strength and normal reflexes. She displays no atrophy and no tremor. No cranial nerve deficit or sensory deficit. She exhibits normal muscle tone. She displays a negative Romberg sign. Coordination and gait normal.  No focal cerebellar signs   Skin: Skin is warm and dry. No rash noted. No pallor.  Psychiatric: Her speech is normal and behavior is normal. Thought content normal. Her mood appears anxious. Her  affect is not blunt, not labile and not inappropriate.  Seems fatigued and mildly anxious           Assessment & Plan:   Problem List Items Addressed This Visit      Cardiovascular and Mediastinum   Essential hypertension - Primary    bp in fair control at this time  BP Readings from Last 1 Encounters:  06/26/17 130/78   No changes needed Disc lifstyle change with low sodium diet and exercise        Migraine without aura    Worse- trigger may be multifactorial  Worse sleep/ more stress/ menopausal hormone change/wether changes  Will be more aggressive with allergy control by using flonase daily  Inc sleep hours  Inc paxil to 20 mg daily to help sleep/mood and menopause symptoms  Update if not starting to improve in a week or if worsening        Relevant Medications   PARoxetine (PAXIL) 20 MG tablet     Respiratory   Allergic rhinitis    Likely triggering headaches  Add flonase ns daily  Avoid allergens         Other   Adjustment disorder with mixed anxiety and depressed mood    Reviewed stressors/ coping techniques/symptoms/ support sources/ tx options and side effects in detail today More work stressors and not enough sleep  Causing worse headaches  Will inc paxil to 20 mg daily  Offered counseling ref- declines for now  Update if not starting to improve in 1-2 weeks or if worsening   Discussed expectations of SSRI medication including time to effectiveness and mechanism of action, also poss of side effects (early and late)- including mental fuzziness, weight or appetite change, nausea and poss of worse dep or anxiety (even suicidal thoughts)  Pt voiced understanding and will stop med and update if this occurs

## 2017-06-28 NOTE — Assessment & Plan Note (Signed)
Worse- trigger may be multifactorial  Worse sleep/ more stress/ menopausal hormone change/wether changes  Will be more aggressive with allergy control by using flonase daily  Inc sleep hours  Inc paxil to 20 mg daily to help sleep/mood and menopause symptoms  Update if not starting to improve in a week or if worsening

## 2017-06-28 NOTE — Assessment & Plan Note (Signed)
Reviewed stressors/ coping techniques/symptoms/ support sources/ tx options and side effects in detail today More work stressors and not enough sleep  Causing worse headaches  Will inc paxil to 20 mg daily  Offered counseling ref- declines for now  Update if not starting to improve in 1-2 weeks or if worsening   Discussed expectations of SSRI medication including time to effectiveness and mechanism of action, also poss of side effects (early and late)- including mental fuzziness, weight or appetite change, nausea and poss of worse dep or anxiety (even suicidal thoughts)  Pt voiced understanding and will stop med and update if this occurs

## 2017-06-28 NOTE — Assessment & Plan Note (Signed)
bp in fair control at this time  BP Readings from Last 1 Encounters:  06/26/17 130/78   No changes needed Disc lifstyle change with low sodium diet and exercise

## 2017-06-28 NOTE — Assessment & Plan Note (Signed)
Likely triggering headaches  Add flonase ns daily  Avoid allergens

## 2017-07-01 ENCOUNTER — Encounter: Payer: Self-pay | Admitting: Family Medicine

## 2017-11-04 ENCOUNTER — Encounter: Payer: Self-pay | Admitting: Family Medicine

## 2017-11-05 ENCOUNTER — Ambulatory Visit: Payer: BC Managed Care – PPO | Admitting: Internal Medicine

## 2017-11-05 ENCOUNTER — Encounter: Payer: Self-pay | Admitting: Internal Medicine

## 2017-11-05 VITALS — BP 122/84 | HR 66 | Temp 97.9°F | Wt 197.0 lb

## 2017-11-05 DIAGNOSIS — B9789 Other viral agents as the cause of diseases classified elsewhere: Secondary | ICD-10-CM | POA: Diagnosis not present

## 2017-11-05 DIAGNOSIS — J029 Acute pharyngitis, unspecified: Secondary | ICD-10-CM | POA: Diagnosis not present

## 2017-11-05 DIAGNOSIS — J069 Acute upper respiratory infection, unspecified: Secondary | ICD-10-CM | POA: Diagnosis not present

## 2017-11-05 MED ORDER — FLUTICASONE PROPIONATE 50 MCG/ACT NA SUSP: 2.0000 | Freq: Every day | NASAL | 1 refills | Status: DC

## 2017-11-06 ENCOUNTER — Encounter: Payer: Self-pay | Admitting: Internal Medicine

## 2017-11-06 MED ORDER — METHYLPREDNISOLONE ACETATE 80 MG/ML IJ SUSP: 80.0000 mg | Freq: Once | INTRAMUSCULAR | Status: DC

## 2017-11-06 NOTE — Progress Notes (Signed)
Subjective:    Patient ID: Stacy Monroe, female    DOB: 04-Jun-1966, 52 y.o.   MRN: [id number]  HPI  Pt presents to the clinic today with c/o headache, sore throat and cough. This started 3 days ago. She describes the headache as pressure. She denies difficulty swallowing but has had some loss of voice. The cough is nonproductive. She denies fever, chills or body aches. She has been having some loose stools but denies nausea, vomiting, abdominal pain or blood in her stool. She has tried Zyrtec, Flonase, Mucinex and Ibuprofen without any relief. She has not had sick contacts.  Review of Systems      Past Medical History:  Diagnosis Date  . Diabetes mellitus    type II controlled  . Hyperlipidemia   . Hypertension   . Migraine    common  . Obesity   . Palpitations   . Plantar fasciitis   . S/P tonsillectomy   . Vertigo     Current Outpatient Medications  Medication Sig Dispense Refill  . albuterol (PROVENTIL HFA;VENTOLIN HFA) 108 (90 Base) MCG/ACT inhaler Inhale 2 puffs into the lungs every 6 (six) hours as needed for wheezing or shortness of breath. 1 Inhaler 2  . aspirin 81 MG tablet Take 81 mg by mouth daily.      Marland Kitchen atorvastatin (LIPITOR) 20 MG tablet TAKE ONE TABLET BY MOUTH ONCE DAILY 90 tablet 0  . benzonatate (TESSALON) 200 MG capsule Take 1 capsule (200 mg total) by mouth 3 (three) times daily as needed. Do not bite pill (swallow whole) 30 capsule 1  . cetirizine (ZYRTEC) 10 MG tablet TAKE ONE TABLET BY MOUTH EVERY DAY 30 tablet 2  . glucose blood (ONE TOUCH ULTRA TEST) test strip USE ONE STRIP TO CHECK GLUCOSE THREE TIMES DAILY TO 4 TIMES DAILY AS NEEDED Dx. E11.9 100 each 5  . LEVEMIR FLEXTOUCH 100 UNIT/ML Pen Inject 60 Units into the skin at bedtime.     Marland Kitchen lisinopril (PRINIVIL,ZESTRIL) 20 MG tablet TAKE ONE TABLET BY MOUTH ONCE DAILY 90 tablet 0  . metFORMIN (GLUCOPHAGE-XR) 500 MG 24 hr tablet TAKE TWO TABLETS BY MOUTH IN THE EVENING (Patient taking differently: TAKE  THREE TABLETS BY MOUTH IN THE EVENING) 180 tablet 0  . PARoxetine (PAXIL) 20 MG tablet Take 1 tablet (20 mg total) by mouth daily. 30 tablet 11  . Semaglutide (OZEMPIC) 0.25 or 0.5 MG/DOSE SOPN Inject 0.5 mg into the skin daily.    . fluticasone (FLONASE) 50 MCG/ACT nasal spray Place 2 sprays into both nostrils daily. 16 g 1  . SUMAtriptan (IMITREX) 100 MG tablet Take 1 tablet (100 mg total) by mouth once. May repeat in 2 hours if headache persists or recurs. 10 tablet 11   No current facility-administered medications for this visit.     Allergies  Allergen Reactions  . Shellfish Allergy     Family History  Problem Relation Age of Onset  . Arthritis Mother        RA  . Hypertension Mother   . Drug abuse Mother   . Depression Mother   . Cancer Sister        thyroid cancer, reoccured 2008  . Depression Sister   . Drug abuse Brother   . Stroke Maternal Grandmother   . Alzheimer's disease Maternal Grandmother   . Depression Sister   . Depression Sister     Social History   Socioeconomic History  . Marital status: Married    Spouse  name: Not on file  . Number of children: Not on file  . Years of education: Not on file  . Highest education level: Not on file  Social Needs  . Financial resource strain: Not on file  . Food insecurity - worry: Not on file  . Food insecurity - inability: Not on file  . Transportation needs - medical: Not on file  . Transportation needs - non-medical: Not on file  Occupational History  . Not on file  Tobacco Use  . Smoking status: Never Smoker  . Smokeless tobacco: Never Used  Substance and Sexual Activity  . Alcohol use: No    Alcohol/week: 0.0 oz  . Drug use: No  . Sexual activity: Not on file  Other Topics Concern  . Not on file  Social History Narrative  . Not on file     Constitutional: Pt reports headache. Denies fever, malaise, fatigue, or abrupt weight changes.  HEENT: Pt reports sore throat. Denies eye pain, eye redness,  ear pain, ringing in the ears, wax buildup, runny nose, nasal congestion, bloody nose Respiratory: Pt reports cough. Denies difficulty breathing, shortness of breath, or sputum production.   Cardiovascular: Denies chest pain, chest tightness, palpitations or swelling in the hands or feet.  Gastrointestinal: Pt reports loose stools. Denies abdominal pain, bloating, constipation, or blood in the stool.   No other specific complaints in a complete review of systems (except as listed in HPI above).  Objective:   Physical Exam   BP 122/84   Pulse 66   Temp 97.9 F (36.6 C) (Oral)   Wt 197 lb (89.4 kg)   SpO2 98%   BMI 33.81 kg/m  Wt Readings from Last 3 Encounters:  11/05/17 197 lb (89.4 kg)  06/26/17 205 lb 8 oz (93.2 kg)  01/23/17 210 lb 8 oz (95.5 kg)    General: Appears her stated age, in NAD. HEENT: Head: normal shape and size, no sinus tenderness noted;  Ears: Tm's gray and intact, normal light reflex; Nose: mucosa pink and moist, septum midline; Throat/Mouth: Teeth present, mucosa pink and moist, no exudate, lesions or ulcerations noted.  Neck:  No adenopathy noted. Pulmonary/Chest: Normal effort and positive vesicular breath sounds. No respiratory distress. No wheezes, rales or ronchi noted.    BMET    Component Value Date/Time   NA 138 03/07/2016 1127   K 3.9 03/07/2016 1127   CL 104 03/07/2016 1127   CO2 22 03/07/2016 1127   GLUCOSE 130 (H) 03/07/2016 1127   BUN 14 03/07/2016 1127   CREATININE 0.77 03/07/2016 1127   CALCIUM 9.0 03/07/2016 1127   GFRNONAA >60 03/07/2016 1127   GFRAA >60 03/07/2016 1127    Lipid Panel     Component Value Date/Time   CHOL 235 (H) 07/09/2015 0950   TRIG 350.0 (H) 07/09/2015 0950   HDL 36.50 (L) 07/09/2015 0950   CHOLHDL 6 07/09/2015 0950   VLDL 70.0 (H) 07/09/2015 0950   LDLCALC 63 04/10/2015 0824    CBC    Component Value Date/Time   WBC 8.9 03/07/2016 1127   RBC 4.60 03/07/2016 1127   HGB 13.0 03/07/2016 1127   HCT  38.4 03/07/2016 1127   PLT 241 03/07/2016 1127   MCV 83.6 03/07/2016 1127   MCH 28.3 03/07/2016 1127   MCHC 33.8 03/07/2016 1127   RDW 15.1 (H) 03/07/2016 1127   LYMPHSABS 1.7 04/10/2015 0824   MONOABS 0.6 04/10/2015 0824   EOSABS 0.2 04/10/2015 0824   BASOSABS 0.0 04/10/2015  0824    Hgb A1C Lab Results  Component Value Date   HGBA1C 9.2 (H) 07/09/2015           Assessment & Plan:   Viral URI with Cough:  Continue Zyrtec and Flonase 80 mg Depo IM today Delsym as needed for cough  If sore throat, hoarseness, cough do not improve, consider Zantac vs PPI for possible silent reflux  Return precautions discussed Webb Silversmith, NP

## 2017-11-06 NOTE — Patient Instructions (Signed)

## 2017-11-23 ENCOUNTER — Ambulatory Visit: Payer: Self-pay | Admitting: Family Medicine

## 2018-01-25 LAB — HEMOGLOBIN A1C: Hemoglobin A1C: 9.4

## 2018-02-07 ENCOUNTER — Emergency Department
Admission: EM | Admit: 2018-02-07 | Discharge: 2018-02-08 | Disposition: A | Discharge status: Home / Self Care | Payer: BLUE CROSS/BLUE SHIELD | Attending: Emergency Medicine | Admitting: Emergency Medicine

## 2018-02-07 ENCOUNTER — Other Ambulatory Visit: Payer: Self-pay

## 2018-02-07 ENCOUNTER — Emergency Department: Payer: BLUE CROSS/BLUE SHIELD

## 2018-02-07 DIAGNOSIS — Z794 Long term (current) use of insulin: Secondary | ICD-10-CM | POA: Insufficient documentation

## 2018-02-07 DIAGNOSIS — I10 Essential (primary) hypertension: Secondary | ICD-10-CM | POA: Diagnosis not present

## 2018-02-07 DIAGNOSIS — G43901 Migraine, unspecified, not intractable, with status migrainosus: Secondary | ICD-10-CM | POA: Insufficient documentation

## 2018-02-07 DIAGNOSIS — H9201 Otalgia, right ear: Secondary | ICD-10-CM | POA: Insufficient documentation

## 2018-02-07 DIAGNOSIS — Z79899 Other long term (current) drug therapy: Secondary | ICD-10-CM | POA: Diagnosis not present

## 2018-02-07 DIAGNOSIS — Z7982 Long term (current) use of aspirin: Secondary | ICD-10-CM | POA: Diagnosis not present

## 2018-02-07 DIAGNOSIS — E119 Type 2 diabetes mellitus without complications: Secondary | ICD-10-CM | POA: Diagnosis not present

## 2018-02-07 DIAGNOSIS — R51 Headache: Secondary | ICD-10-CM | POA: Diagnosis present

## 2018-02-07 LAB — GLUCOSE, CAPILLARY: Glucose-Capillary: 219 mg/dL — ABNORMAL HIGH (ref 65–99)

## 2018-02-07 MED ORDER — DIPHENHYDRAMINE HCL 50 MG/ML IJ SOLN
25.0000 mg | Freq: Once | INTRAMUSCULAR | Status: AC
  Administered 2018-02-07: 25 mg via INTRAVENOUS
  Filled 2018-02-07: qty 1

## 2018-02-07 MED ORDER — SODIUM CHLORIDE 0.9 % IV BOLUS
1000.0000 mL | Freq: Once | INTRAVENOUS | Status: AC
  Administered 2018-02-07: 1000 mL via INTRAVENOUS

## 2018-02-07 MED ORDER — PROCHLORPERAZINE EDISYLATE 10 MG/2ML IJ SOLN
10.0000 mg | Freq: Once | INTRAMUSCULAR | Status: AC
  Administered 2018-02-07: 10 mg via INTRAVENOUS
  Filled 2018-02-07: qty 2

## 2018-02-07 MED ORDER — KETOROLAC TROMETHAMINE 30 MG/ML IJ SOLN
30.0000 mg | Freq: Once | INTRAMUSCULAR | Status: AC
  Administered 2018-02-07: 30 mg via INTRAVENOUS
  Filled 2018-02-07: qty 1

## 2018-02-07 NOTE — ED Triage Notes (Signed)
Pt ambulatory to triage with no difficulty. Pt reports 2 to 3 weeks of intermittent headaches and right ear pain. Pt reports hx of migraines and took one dose of sumatriptan 100mg  around 6 pm. Pt also reports her blood pressure has been elevated more than normal over the last few weeks.

## 2018-02-07 NOTE — ED Provider Notes (Signed)
[facility] Emergency Department Provider Note  ___________________________________________   First MD Initiated Contact with Patient 02/07/18 2048     (approximate)  I have reviewed the triage vital signs and the nursing notes.   HISTORY  Chief Complaint Migraine and Otalgia   HPI Stacy Monroe is a 52 y.o. female history of migraine headaches as well as intermittent right-sided ear pain secondary to a TM perforation from otitis media who is presenting to the emergency department with approximately 2 weeks of a sharp headache which is waxing and waning.  She says that her symptoms started with her "hayfever."  Says the headache was frontal and sharp.  Now is diffuse.  8-9 out of 10.  No sudden onset.  Does not report any neck pain.  However, does report photophobia as well as nausea.  Does not report any weakness or numbness.  Says that this is typical of her previous migraines except this headache is not responsive to sumatriptan.   Past Medical History:  Diagnosis Date  . Diabetes mellitus    type II controlled  . Hyperlipidemia   . Hypertension   . Migraine    common  . Obesity   . Palpitations   . Plantar fasciitis   . S/P tonsillectomy   . Vertigo     Patient Active Problem List   Diagnosis Date Noted  . Allergic rhinitis 06/26/2017  . Ear pain, bilateral 08/07/2016  . Shingles 08/06/2016  . Perimenopause 03/10/2016  . Neck pain 10/16/2015  . Colon cancer screening 01/10/2015  . Routine general medical examination at a health care facility 01/02/2015  . Menorrhagia 06/14/2014  . Low back pain 11/25/2013  . Abnormal EKG 01/08/2012  . Chest pressure 12/04/2011  . Adjustment disorder with mixed anxiety and depressed mood 06/24/2011  . Elevated transaminase level 01/21/2011  . PLANTAR FASCIITIS 10/02/2010  . Vitamin D deficiency 04/26/2010  . Essential hypertension 02/15/2009  . Diabetes mellitus type 2, uncontrolled ([place]) 09/07/2008    . Hyperlipidemia 09/07/2008  . Obesity 04/24/2008  . Migraine without aura 04/18/2008    Past Surgical History:  Procedure Laterality Date  . BLADDER SURGERY  1990   bladder tack after 2nd delivery    Prior to Admission medications   Medication Sig Start Date End Date Taking? Authorizing Provider  albuterol (PROVENTIL HFA;VENTOLIN HFA) 108 (90 Base) MCG/ACT inhaler Inhale 2 puffs into the lungs every 6 (six) hours as needed for wheezing or shortness of breath. 12/24/16   Briscoe Deutscher, DO  aspirin 81 MG tablet Take 81 mg by mouth daily.      [provider]  atorvastatin (LIPITOR) 20 MG tablet TAKE ONE TABLET BY MOUTH ONCE DAILY 07/09/16   Tower, Wynelle Fanny, MD  benzonatate (TESSALON) 200 MG capsule Take 1 capsule (200 mg total) by mouth 3 (three) times daily as needed. Do not bite pill (swallow whole) 01/23/17   Tower, Wynelle Fanny, MD  cetirizine (ZYRTEC) 10 MG tablet TAKE ONE TABLET BY MOUTH EVERY DAY 02/02/13   Tower, Wynelle Fanny, MD  fluticasone (FLONASE) 50 MCG/ACT nasal spray Place 2 sprays into both nostrils daily. 11/05/17   Jearld Fenton, NP  glucose blood (ONE TOUCH ULTRA TEST) test strip USE ONE STRIP TO CHECK GLUCOSE THREE TIMES DAILY TO 4 TIMES DAILY AS NEEDED Dx. E11.9 07/17/15   Tower, Wynelle Fanny, MD  LEVEMIR FLEXTOUCH 100 UNIT/ML Pen Inject 60 Units into the skin at bedtime.  06/05/17   [provider]  lisinopril (PRINIVIL,ZESTRIL) 20 MG tablet TAKE ONE TABLET BY MOUTH ONCE DAILY 12/13/15   Tower, Wynelle Fanny, MD  metFORMIN (GLUCOPHAGE-XR) 500 MG 24 hr tablet TAKE TWO TABLETS BY MOUTH IN THE EVENING Patient taking differently: TAKE THREE TABLETS BY MOUTH IN THE EVENING 12/13/15   Tower, Wynelle Fanny, MD  PARoxetine (PAXIL) 20 MG tablet Take 1 tablet (20 mg total) by mouth daily. 06/26/17   Tower, Wynelle Fanny, MD  Semaglutide (OZEMPIC) 0.25 or 0.5 MG/DOSE SOPN Inject 0.5 mg into the skin daily. 09/14/17   [provider]  SUMAtriptan (IMITREX) 100 MG tablet Take 1 tablet (100 mg  total) by mouth once. May repeat in 2 hours if headache persists or recurs. 06/26/17 06/26/17  Tower, Wynelle Fanny, MD  ipratropium (ATROVENT) 0.03 % nasal spray Place 2 sprays into the nose 3 (three) times daily. 07/08/11 12/04/11  Owens Loffler, MD    Allergies Shellfish allergy  Family History  Problem Relation Age of Onset  . Arthritis Mother        RA  . Hypertension Mother   . Drug abuse Mother   . Depression Mother   . Cancer Sister        thyroid cancer, reoccured 2008  . Depression Sister   . Drug abuse Brother   . Stroke Maternal Grandmother   . Alzheimer's disease Maternal Grandmother   . Depression Sister   . Depression Sister     Social History Social History   Tobacco Use  . Smoking status: Never Smoker  . Smokeless tobacco: Never Used  Substance Use Topics  . Alcohol use: No    Alcohol/week: 0.0 oz  . Drug use: No    Review of Systems  Constitutional: No fever/chills Eyes: As above ENT: No sore throat. Cardiovascular: Denies chest pain. Respiratory: Denies shortness of breath. Gastrointestinal: No abdominal pain.   no vomiting.  No diarrhea.  No constipation. Genitourinary: Negative for dysuria. Musculoskeletal: Negative for back pain. Skin: Negative for rash. Neurological: Negative for focal weakness or numbness.   ____________________________________________   PHYSICAL EXAM:  VITAL SIGNS: ED Triage Vitals  Enc Vitals Group     BP 02/07/18 2020 (!) 201/84     Pulse Rate 02/07/18 2020 61     Resp 02/07/18 2020 18     Temp 02/07/18 2020 98.6 F (37 C)     Temp Source 02/07/18 2020 Oral     SpO2 02/07/18 2020 98 %     Weight 02/07/18 2021 205 lb (93 kg)     Height 02/07/18 2021 5\' 4"  (1.626 m)     Head Circumference --      Peak Flow --      Pain Score 02/07/18 2021 9     Pain Loc --      Pain Edu? --      Excl. in [place]? --     Constitutional: Alert and oriented.  Appears uncomfortable.  Lights off in the room. Eyes: Conjunctivae are  normal.  Head: Atraumatic.  Normal left TM.  Right TM with about a 1 to 2 mm perforation.  TM is clear.  Not bulging.  Does not appear to have any effusion on the TM. Nose: No congestion/rhinnorhea. Mouth/Throat: Mucous membranes are moist.  Neck: No stridor.   Cardiovascular: Normal rate, regular rhythm. Grossly normal heart sounds.   Respiratory: Normal respiratory effort.  No retractions. Lungs CTAB. Gastrointestinal: Soft and nontender. No distention.  Musculoskeletal: No lower extremity tenderness nor edema.  No joint effusions.  Neurologic:  Normal speech and language. No gross focal neurologic deficits are appreciated. Skin:  Skin is warm, dry and intact. No rash noted. Psychiatric: Mood and affect are normal. Speech and behavior are normal.  ____________________________________________   LABS (all labs ordered are listed, but only abnormal results are displayed)  Labs Reviewed  GLUCOSE, CAPILLARY - Abnormal; Notable for the following components:      Result Value   Glucose-Capillary 219 (*)    All other components within normal limits  CBG MONITORING, ED   ____________________________________________  EKG   ____________________________________________  RADIOLOGY  Normal noncontrast head CT. ____________________________________________   PROCEDURES  Procedure(s) performed:   Procedures  Critical Care performed:   ____________________________________________   INITIAL IMPRESSION / ASSESSMENT AND PLAN / ED COURSE  Pertinent labs & imaging results that were available during my care of the patient were reviewed by me and considered in my medical decision making (see chart for details).  Differential diagnosis includes, but is not limited to, intracranial hemorrhage, meningitis/encephalitis, previous head trauma, cavernous venous thrombosis, tension headache, temporal arteritis, migraine or migraine equivalent, idiopathic intracranial hypertension, and  non-specific headache. As part of my medical decision making, I reviewed the following data within the electronic MEDICAL RECORD NUMBER Notes from prior ED visits  ----------------------------------------- 11:10 PM on 02/07/2018 -----------------------------------------  Patient with relief of her headache symptoms at this time.  Slight improvement in the blood pressure.  Patient says that she takes an evening dose of a blood pressure medication but has not taken it this evening.  Unlikely that the headache is related to her elevated blood pressure.  Patient will be discharged at this time.  Reassuring CAT scan.  Discussed the results with the patient as well as her husband.  They are understanding of the diagnosis as well as treatment plan and willing to comply. ____________________________________________   FINAL CLINICAL IMPRESSION(S) / ED DIAGNOSES  Migraine headache.  Elevated blood pressure.  NEW MEDICATIONS STARTED DURING THIS VISIT:  New Prescriptions   No medications on file     Note:  This document was prepared using Dragon voice recognition software and may include unintentional dictation errors.     Orbie Pyo, MD 02/07/18 [phone]

## 2018-02-10 ENCOUNTER — Encounter: Payer: Self-pay | Admitting: Family Medicine

## 2018-02-10 ENCOUNTER — Encounter (INDEPENDENT_AMBULATORY_CARE_PROVIDER_SITE_OTHER): Payer: Self-pay

## 2018-02-10 ENCOUNTER — Ambulatory Visit: Payer: BLUE CROSS/BLUE SHIELD | Admitting: Family Medicine

## 2018-02-10 VITALS — BP 156/88 | HR 65 | Temp 97.8°F | Ht 64.0 in | Wt 205.8 lb

## 2018-02-10 DIAGNOSIS — E1165 Type 2 diabetes mellitus with hyperglycemia: Secondary | ICD-10-CM | POA: Diagnosis not present

## 2018-02-10 DIAGNOSIS — F4323 Adjustment disorder with mixed anxiety and depressed mood: Secondary | ICD-10-CM | POA: Diagnosis not present

## 2018-02-10 DIAGNOSIS — G43009 Migraine without aura, not intractable, without status migrainosus: Secondary | ICD-10-CM | POA: Diagnosis not present

## 2018-02-10 DIAGNOSIS — I10 Essential (primary) hypertension: Secondary | ICD-10-CM | POA: Diagnosis not present

## 2018-02-10 MED ORDER — AMLODIPINE BESYLATE 5 MG PO TABS: 5.0000 mg | ORAL_TABLET | Freq: Every day | ORAL | 11 refills | Status: AC

## 2018-02-10 NOTE — Progress Notes (Signed)
Subjective:    Patient ID: Stacy Monroe, female    DOB: 1965-12-05, 52 y.o.   MRN: [id number]  HPI Here for f/u of ED visit   Seen in 5/12 for migraine and ear pain  Presented with sharp headache/waxes and wanes, allergy symptoms  and ear pain (hx of TM perf from OM) Had photophobia and nausea  Not resp to imitrex   A/p as follows: Patient with relief of her headache symptoms at this time.  Slight improvement in the blood pressure.  Patient says that she takes an evening dose of a blood pressure medication but has not taken it this evening.  Unlikely that the headache is related to her elevated blood pressure.  Patient will be discharged at this time.  Reassuring CAT scan.  Discussed the results with the patient as well as her husband.  They are understanding of the diagnosis as well as treatment plan and willing to comply. ____________________________________________  Results for orders placed or performed during the hospital encounter of 02/07/18  Glucose, capillary  Result Value Ref Range   Glucose-Capillary 219 (H) 65 - 99 mg/dL    BP Readings from Last 3 Encounters:  02/10/18 (!) 156/88  02/08/18 (!) 161/91  11/05/17 122/84  for bp takes lisinopril 20    Ct Head Wo Contrast  Result Date: 02/07/2018 CLINICAL DATA:  52 year old female with intermittent headaches and right ear pain for 2-3 weeks. EXAM: CT HEAD WITHOUT CONTRAST TECHNIQUE: Contiguous axial images were obtained from the base of the skull through the vertex without intravenous contrast. COMPARISON:  Face CT 08/07/2016. FINDINGS: Brain: No midline shift, ventriculomegaly, mass effect, evidence of mass lesion, intracranial hemorrhage or evidence of cortically based acute infarction. Gray-white matter differentiation is within normal limits throughout the brain. Vascular: No suspicious intracranial vascular hyperdensity. Skull: Negative. Sinuses/Orbits: The visible paranasal sinuses are clear. The bilateral tympanic  cavities and mastoids appear clear. The right EAC appears normal. Other: Visualized orbits and scalp soft tissues are within normal limits. IMPRESSION: Normal noncontrast Head CT. Electronically Signed   By: Genevie Ann M.D.   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 If high glucose caused some dehydration  A1C was 8.2 in sept and 8.1 in dec, 9.4 in April  No idea what is making it go up -this is frustrated   imitrex has not worked well  relpax works - but ins will not cover    She has  been belching more  No heartburn  No stomach pain   Is getting into menopause  Periods are less regular   Patient Active Problem List   Diagnosis Date Noted  . Allergic rhinitis 06/26/2017  . Ear pain, bilateral 08/07/2016  . Shingles 08/06/2016  . Perimenopause 03/10/2016  . Neck pain 10/16/2015  . Colon cancer screening 01/10/2015  . Routine general medical examination at a health care facility 01/02/2015  . Menorrhagia 06/14/2014  . Low back pain 11/25/2013  . Abnormal EKG 01/08/2012  . Chest pressure 12/04/2011  . Adjustment disorder with mixed anxiety and depressed mood 06/24/2011  . Elevated  transaminase level 01/21/2011  . PLANTAR FASCIITIS 10/02/2010  . Vitamin D deficiency 04/26/2010  . Essential hypertension 02/15/2009  . Diabetes mellitus type 2, uncontrolled ([place]) 09/07/2008  . Hyperlipidemia 09/07/2008  . Obesity 04/24/2008  . Migraine without aura 04/18/2008   Past Medical History:  Diagnosis Date  . Diabetes mellitus    type II controlled  . Hyperlipidemia   . Hypertension   . Migraine    common  . Obesity   . Palpitations   . Plantar fasciitis   . S/P tonsillectomy   . Vertigo    Past Surgical History:  Procedure Laterality Date  . BLADDER SURGERY  1990   bladder tack after 2nd delivery   Social History   Tobacco Use  . Smoking status: Never Smoker  . Smokeless tobacco: Never Used  Substance Use Topics  . Alcohol use: No    Alcohol/week: 0.0 oz  . Drug use: No   Family History  Problem Relation Age of Onset  . Arthritis Mother        RA  . Hypertension Mother   . Drug abuse Mother   . Depression Mother   . Depression Sister   . Cancer Sister        thyroid cancer, reoccured 2008  . Depression Sister   . Drug abuse Brother   . Stroke Maternal Grandmother   . Alzheimer's disease Maternal Grandmother   . Depression Sister    Allergies  Allergen Reactions  . Shellfish Allergy Swelling  . Gabapentin Other (See Comments)    Disoriented   Current Outpatient Medications on File Prior to Visit  Medication Sig Dispense Refill  . albuterol (PROVENTIL HFA;VENTOLIN HFA) 108 (90 Base) MCG/ACT inhaler Inhale 2 puffs into the lungs every 6 (six) hours as needed for wheezing or shortness of breath. 1 Inhaler 2  . aspirin 81 MG tablet Take 81 mg by mouth daily.      Marland Kitchen atorvastatin (LIPITOR) 20 MG tablet TAKE ONE TABLET BY MOUTH ONCE DAILY 90 tablet 0  . cetirizine (ZYRTEC) 10 MG tablet TAKE ONE TABLET BY MOUTH EVERY DAY 30 tablet 2  . fluticasone (FLONASE) 50 MCG/ACT nasal spray Place 2 sprays into both nostrils daily. 16 g 1  . glucose blood  (ONE TOUCH ULTRA TEST) test strip USE ONE STRIP TO CHECK GLUCOSE THREE TIMES DAILY TO 4 TIMES DAILY AS NEEDED Dx. E11.9 100 each 5  . LEVEMIR FLEXTOUCH 100 UNIT/ML Pen Inject 60 Units into the skin at bedtime.     Marland Kitchen lisinopril (PRINIVIL,ZESTRIL) 20 MG tablet TAKE ONE TABLET BY MOUTH ONCE DAILY 90 tablet 0  . PARoxetine (PAXIL) 20 MG tablet Take 1 tablet (20 mg total) by mouth daily. 30 tablet 11  . Semaglutide (OZEMPIC) 0.25 or 0.5 MG/DOSE SOPN Inject 0.5 mg into the skin once a week.     . SUMAtriptan (  IMITREX) 100 MG tablet Take 1 tablet (100 mg total) by mouth once. May repeat in 2 hours if headache persists or recurs. 10 tablet 11  . [DISCONTINUED] ipratropium (ATROVENT) 0.03 % nasal spray Place 2 sprays into the nose 3 (three) times daily. 30 mL 2   Current Facility-Administered Medications on File Prior to Visit  Medication Dose Route Frequency Provider Last Rate Last Dose  . methylPREDNISolone acetate (DEPO-MEDROL) injection 80 mg  80 mg Intramuscular Once Jearld Fenton, NP         Review of Systems  Constitutional: Negative for activity change, appetite change, fatigue, fever and unexpected weight change.  HENT: Positive for ear pain, postnasal drip and rhinorrhea. Negative for congestion, ear discharge, nosebleeds, sinus pressure and sore throat.   Eyes: Negative for pain, redness and visual disturbance.  Respiratory: Negative for cough, shortness of breath and wheezing.   Cardiovascular: Negative for chest pain and palpitations.  Gastrointestinal: Negative for abdominal pain, blood in stool, constipation and diarrhea.  Endocrine: Negative for polydipsia and polyuria.  Genitourinary: Negative for dysuria, frequency and urgency.  Musculoskeletal: Negative for arthralgias, back pain and myalgias.  Skin: Negative for pallor and rash.  Allergic/Immunologic: Negative for environmental allergies.  Neurological: Positive for headaches. Negative for dizziness, tremors, seizures, syncope,  facial asymmetry, speech difficulty, weakness, light-headedness and numbness.  Hematological: Negative for adenopathy. Does not bruise/bleed easily.  Psychiatric/Behavioral: Negative for decreased concentration and dysphoric mood. The patient is not nervous/anxious.        Objective:   Physical Exam  Constitutional: She is oriented to person, place, and time. She appears well-developed and well-nourished. No distress.  obese and well appearing   HENT:  Head: Normocephalic and atraumatic.  Right Ear: External ear normal.  Left Ear: External ear normal.  Nose: Nose normal.  Mouth/Throat: Oropharynx is clear and moist. No oropharyngeal exudate.  No sinus tenderness No temporal tenderness  No TMJ tenderness  Nares are boggy Some PND   TMs are dull-no erythema or bulging   Eyes: Pupils are equal, round, and reactive to light. Conjunctivae and EOM are normal. Right eye exhibits no discharge. Left eye exhibits no discharge. No scleral icterus.  No nystagmus  Neck: Normal range of motion and full passive range of motion without pain. Neck supple. No JVD present. Carotid bruit is not present. No tracheal deviation present. No thyromegaly present.  Cardiovascular: Normal rate, regular rhythm and normal heart sounds.  No murmur heard. Pulmonary/Chest: Effort normal and breath sounds normal. No respiratory distress. She has no wheezes. She has no rales.  Abdominal: Soft. Bowel sounds are normal. She exhibits no distension and no mass. There is no tenderness.  Musculoskeletal: She exhibits no edema or tenderness.  Lymphadenopathy:    She has no cervical adenopathy.  Neurological: She is alert and oriented to person, place, and time. She has normal strength and normal reflexes. She displays no atrophy and no tremor. No cranial nerve deficit or sensory deficit. She exhibits normal muscle tone. She displays a negative Romberg sign. Coordination and gait normal.  No focal cerebellar signs   Skin:  Skin is warm and dry. No rash noted. No pallor.  Psychiatric: Her speech is normal and behavior is normal. Thought content normal. Her mood appears not anxious. She exhibits a depressed mood.  Pleasant  Generally tired and down due to headaches           Assessment & Plan:   Problem List Items Addressed This Visit  Cardiovascular and Mediastinum   Essential hypertension - Primary    BP is elevated-potentially worsening headaches  Add amlodipine 5 mg daily  Disc lifestyle change- DASH eating/good water intake       Relevant Medications   amLODipine (NORVASC) 5 MG tablet   Migraine without aura    Worse lately  Reviewed hospital records, lab results and studies in detail  (ED visit) Reassuring CT of the head  Multiple triggers incl stress/ hormone change/allergies and recent ear pain  Self care is fair  Disc lifestyle change Will work on bp control to see if this helps  If not- she is open to discussing proph medications       Relevant Medications   amLODipine (NORVASC) 5 MG tablet     Endocrine   Diabetes mellitus type 2, uncontrolled ([place])    Continue endocrinology f/u        Other   Adjustment disorder with mixed anxiety and depressed mood    Increased paxil for mood and sleep last time  Some mild improvement  Job change has helped as well  Reviewed stressors/ coping techniques/symptoms/ support sources/ tx options and side effects in detail today

## 2018-02-10 NOTE — Patient Instructions (Addendum)
OMRON blood pressure cuff for the arm size regular -for blood pressure check   Take care of yourself  Drink lots of water- say hydrated  Avoid excess sodium  Avoid caffeine   Start amlodipine 5 mg daily for blood pressure  Continue other medicines  Call and update me or email with some blood pressure readings /ranges in about 2 weeks so we know if we have to add anything  Goal- get bp down and see how headaches are  Then decide if we need some headache prevention medication

## 2018-02-11 ENCOUNTER — Encounter: Payer: Self-pay | Admitting: Emergency Medicine

## 2018-02-11 ENCOUNTER — Other Ambulatory Visit: Payer: Self-pay

## 2018-02-11 ENCOUNTER — Emergency Department
Admission: EM | Admit: 2018-02-11 | Discharge: 2018-02-11 | Disposition: A | Discharge status: Home / Self Care | Payer: BLUE CROSS/BLUE SHIELD | Attending: Emergency Medicine | Admitting: Emergency Medicine

## 2018-02-11 ENCOUNTER — Emergency Department: Payer: BLUE CROSS/BLUE SHIELD

## 2018-02-11 DIAGNOSIS — Z79899 Other long term (current) drug therapy: Secondary | ICD-10-CM | POA: Diagnosis not present

## 2018-02-11 DIAGNOSIS — E119 Type 2 diabetes mellitus without complications: Secondary | ICD-10-CM | POA: Insufficient documentation

## 2018-02-11 DIAGNOSIS — G43809 Other migraine, not intractable, without status migrainosus: Secondary | ICD-10-CM

## 2018-02-11 DIAGNOSIS — Z7984 Long term (current) use of oral hypoglycemic drugs: Secondary | ICD-10-CM | POA: Insufficient documentation

## 2018-02-11 DIAGNOSIS — I10 Essential (primary) hypertension: Secondary | ICD-10-CM | POA: Diagnosis not present

## 2018-02-11 DIAGNOSIS — Z7982 Long term (current) use of aspirin: Secondary | ICD-10-CM | POA: Insufficient documentation

## 2018-02-11 DIAGNOSIS — G43909 Migraine, unspecified, not intractable, without status migrainosus: Secondary | ICD-10-CM | POA: Insufficient documentation

## 2018-02-11 DIAGNOSIS — R200 Anesthesia of skin: Secondary | ICD-10-CM | POA: Diagnosis present

## 2018-02-11 LAB — DIFFERENTIAL
BASOS ABS: 0.1 10*3/uL (ref 0–0.1)
BASOS PCT: 1 %
Eosinophils Absolute: 0.3 10*3/uL (ref 0–0.7)
Eosinophils Relative: 3 %
Lymphocytes Relative: 22 %
Lymphs Abs: 1.9 10*3/uL (ref 1.0–3.6)
MONOS PCT: 9 %
Monocytes Absolute: 0.8 10*3/uL (ref 0.2–0.9)
NEUTROS PCT: 65 %
Neutro Abs: 5.8 10*3/uL (ref 1.4–6.5)

## 2018-02-11 LAB — COMPREHENSIVE METABOLIC PANEL
ALT: 26 U/L (ref 14–54)
AST: 25 U/L (ref 15–41)
Albumin: 4.3 g/dL (ref 3.5–5.0)
Alkaline Phosphatase: 52 U/L (ref 38–126)
Anion gap: 11 (ref 5–15)
BUN: 15 mg/dL (ref 6–20)
CO2: 25 mmol/L (ref 22–32)
Calcium: 9.3 mg/dL (ref 8.9–10.3)
Chloride: 102 mmol/L (ref 101–111)
Creatinine, Ser: 0.67 mg/dL (ref 0.44–1.00)
GFR calc Af Amer: >60 mL/min (ref >60)
Glucose, Bld: 160 mg/dL — ABNORMAL HIGH (ref 65–99)
Potassium: 3.6 mmol/L (ref 3.5–5.1)
Sodium: 138 mmol/L (ref 135–145)
Total Bilirubin: 0.7 mg/dL (ref 0.3–1.2)
Total Protein: 7.1 g/dL (ref 6.5–8.1)

## 2018-02-11 LAB — TROPONIN I

## 2018-02-11 LAB — CBC
HEMATOCRIT: 37.2 % (ref 35.0–47.0)
Hemoglobin: 12.6 g/dL (ref 12.0–16.0)
MCH: 29.2 pg (ref 26.0–34.0)
MCHC: 33.9 g/dL (ref 32.0–36.0)
MCV: 86.2 fL (ref 80.0–100.0)
Platelets: 256 10*3/uL (ref 150–440)
RBC: 4.31 MIL/uL (ref 3.80–5.20)
RDW: 14.8 % — AB (ref 11.5–14.5)
WBC: 8.8 10*3/uL (ref 3.6–11.0)

## 2018-02-11 LAB — GLUCOSE, CAPILLARY: GLUCOSE-CAPILLARY: 145 mg/dL — AB (ref 65–99)

## 2018-02-11 LAB — PROTIME-INR
INR: 1.03
Prothrombin Time: 13.4 seconds (ref 11.4–15.2)

## 2018-02-11 LAB — APTT: APTT: 30 s (ref 24–36)

## 2018-02-11 MED ORDER — PROCHLORPERAZINE EDISYLATE 10 MG/2ML IJ SOLN
10.0000 mg | Freq: Once | INTRAMUSCULAR | Status: AC
  Administered 2018-02-11: 10 mg via INTRAVENOUS
  Filled 2018-02-11: qty 2

## 2018-02-11 MED ORDER — MAGNESIUM SULFATE 2 GM/50ML IV SOLN
2.0000 g | Freq: Once | INTRAVENOUS | Status: AC
  Administered 2018-02-11: 2 g via INTRAVENOUS
  Filled 2018-02-11: qty 50

## 2018-02-11 MED ORDER — METOCLOPRAMIDE HCL 5 MG/ML IJ SOLN
20.0000 mg | Freq: Once | INTRAVENOUS | Status: AC
  Administered 2018-02-11: 20 mg via INTRAVENOUS
  Filled 2018-02-11: qty 4

## 2018-02-11 MED ORDER — ACETAMINOPHEN 500 MG PO TABS
1000.0000 mg | ORAL_TABLET | Freq: Once | ORAL | Status: AC
  Administered 2018-02-11: 1000 mg via ORAL
  Filled 2018-02-11: qty 2

## 2018-02-11 MED ORDER — KETOROLAC TROMETHAMINE 30 MG/ML IJ SOLN
30.0000 mg | Freq: Once | INTRAMUSCULAR | Status: AC
  Administered 2018-02-11: 30 mg via INTRAVENOUS
  Filled 2018-02-11: qty 1

## 2018-02-11 MED ORDER — DIPHENHYDRAMINE HCL 50 MG/ML IJ SOLN
25.0000 mg | Freq: Once | INTRAMUSCULAR | Status: AC
Start: 2018-02-11 — End: 2018-02-11
  Administered 2018-02-11: 25 mg via INTRAVENOUS
  Filled 2018-02-11: qty 1

## 2018-02-11 MED ORDER — DEXAMETHASONE SODIUM PHOSPHATE 10 MG/ML IJ SOLN
10.0000 mg | Freq: Once | INTRAMUSCULAR | Status: AC
  Administered 2018-02-11: 10 mg via INTRAVENOUS
  Filled 2018-02-11: qty 1

## 2018-02-11 NOTE — ED Triage Notes (Signed)
Pin via POV with complaints of left side numbness to hand and face around 1400 today.  Pt also presents with headache, seen Sunday for same.  Pt with hx of Migraines.  Vitals WDL, NAD noted at this time.

## 2018-02-11 NOTE — ED Notes (Signed)

## 2018-02-11 NOTE — ED Provider Notes (Signed)
[facility] Emergency Department Provider Note   ____________________________________________    I have reviewed the triage vital signs and the nursing notes.   HISTORY  Chief Complaint Numbness     HPI Stacy Monroe is a 52 y.o. female who presents with complaints of left hand and left facial numbness that began at approximately 2 PM.  However her numbness has essentially resolved after evaluation by neurology and complains of a global throbbing headache.  Patient does have a history of migraines but typically does not have neuro deficits.  No fevers or chills.  No extremity weakness.  Has not taken anything for this.  No fevers or chills.  Past Medical History:  Diagnosis Date  . Diabetes mellitus    type II controlled  . Hyperlipidemia   . Hypertension   . Migraine    common  . Obesity   . Palpitations   . Plantar fasciitis   . S/P tonsillectomy   . Vertigo     Patient Active Problem List   Diagnosis Date Noted  . Allergic rhinitis 06/26/2017  . Ear pain, bilateral 08/07/2016  . Shingles 08/06/2016  . Perimenopause 03/10/2016  . Neck pain 10/16/2015  . Colon cancer screening 01/10/2015  . Routine general medical examination at a health care facility 01/02/2015  . Menorrhagia 06/14/2014  . Low back pain 11/25/2013  . Abnormal EKG 01/08/2012  . Chest pressure 12/04/2011  . Adjustment disorder with mixed anxiety and depressed mood 06/24/2011  . Elevated transaminase level 01/21/2011  . PLANTAR FASCIITIS 10/02/2010  . Vitamin D deficiency 04/26/2010  . Essential hypertension 02/15/2009  . Diabetes mellitus type 2, uncontrolled ([place]) 09/07/2008  . Hyperlipidemia 09/07/2008  . Obesity 04/24/2008  . Migraine without aura 04/18/2008    Past Surgical History:  Procedure Laterality Date  . BLADDER SURGERY  1990   bladder tack after 2nd delivery    Prior to Admission medications   Medication Sig Start Date End Date Taking?  Authorizing Provider  albuterol (PROVENTIL HFA;VENTOLIN HFA) 108 (90 Base) MCG/ACT inhaler Inhale 2 puffs into the lungs every 6 (six) hours as needed for wheezing or shortness of breath. 12/24/16   Briscoe Deutscher, DO  amLODipine (NORVASC) 5 MG tablet Take 1 tablet (5 mg total) by mouth daily. 02/10/18   Tower, Wynelle Fanny, MD  aspirin 81 MG tablet Take 81 mg by mouth daily.      [provider]  atorvastatin (LIPITOR) 20 MG tablet TAKE ONE TABLET BY MOUTH ONCE DAILY 07/09/16   Tower, Wynelle Fanny, MD  cetirizine (ZYRTEC) 10 MG tablet TAKE ONE TABLET BY MOUTH EVERY DAY 02/02/13   Tower, Wynelle Fanny, MD  fluticasone (FLONASE) 50 MCG/ACT nasal spray Place 2 sprays into both nostrils daily. 11/05/17   Jearld Fenton, NP  glucose blood (ONE TOUCH ULTRA TEST) test strip USE ONE STRIP TO CHECK GLUCOSE THREE TIMES DAILY TO 4 TIMES DAILY AS NEEDED Dx. E11.9 07/17/15   Tower, Wynelle Fanny, MD  LEVEMIR FLEXTOUCH 100 UNIT/ML Pen Inject 60 Units into the skin at bedtime.  06/05/17   [provider]  lisinopril (PRINIVIL,ZESTRIL) 20 MG tablet TAKE ONE TABLET BY MOUTH ONCE DAILY 12/13/15   Tower, Wynelle Fanny, MD  metFORMIN (GLUCOPHAGE-XR) 500 MG 24 hr tablet TAKE TWO TABLETS BY MOUTH IN THE EVENING Patient taking differently: TAKE THREE TABLETS BY MOUTH IN THE EVENING 12/13/15   Tower, Wynelle Fanny, MD  PARoxetine (PAXIL) 20 MG tablet Take 1 tablet (20 mg total)  by mouth daily. 06/26/17   Tower, Wynelle Fanny, MD  Semaglutide (OZEMPIC) 0.25 or 0.5 MG/DOSE SOPN Inject 0.5 mg into the skin daily. 09/14/17   [provider]  SUMAtriptan (IMITREX) 100 MG tablet Take 1 tablet (100 mg total) by mouth once. May repeat in 2 hours if headache persists or recurs. 06/26/17 06/26/17  Tower, Wynelle Fanny, MD  ipratropium (ATROVENT) 0.03 % nasal spray Place 2 sprays into the nose 3 (three) times daily. 07/08/11 12/04/11  CoplandFrederico Hamman, MD     Allergies Shellfish allergy and Gabapentin  Family History  Problem Relation Age of Onset  . Arthritis  Mother        RA  . Hypertension Mother   . Drug abuse Mother   . Depression Mother   . Depression Sister   . Cancer Sister        thyroid cancer, reoccured 2008  . Depression Sister   . Drug abuse Brother   . Stroke Maternal Grandmother   . Alzheimer's disease Maternal Grandmother   . Depression Sister     Social History Social History   Tobacco Use  . Smoking status: Never Smoker  . Smokeless tobacco: Never Used  Substance Use Topics  . Alcohol use: No    Alcohol/week: 0.0 oz  . Drug use: No    Review of Systems  Constitutional: No fever/chills Eyes: No visual changes.  ENT: No neck pain Cardiovascular: Denies chest pain. Respiratory: Denies shortness of breath. Gastrointestinal: No abdominal pain.  .   Genitourinary: Negative for dysuria. Musculoskeletal: Negative for back pain. Skin: Negative for rash. Neurological: As above   ____________________________________________   PHYSICAL EXAM:  VITAL SIGNS: ED Triage Vitals  Enc Vitals Group     BP 02/11/18 1411 (!) 174/87     Pulse Rate 02/11/18 1411 62     Resp 02/11/18 1411 18     Temp 02/11/18 1411 98.5 F (36.9 C)     Temp Source 02/11/18 1411 Oral     SpO2 02/11/18 1411 92 %     Weight 02/11/18 1431 93 kg (205 lb)     Height 02/11/18 1431 1.626 m (5\' 4" )     Head Circumference --      Peak Flow --      Pain Score 02/11/18 1430 3     Pain Loc --      Pain Edu? --      Excl. in [place]? --     Constitutional: Alert and oriented. No acute distress.  Eyes: Conjunctivae are normal.   Nose: No congestion/rhinnorhea. Mouth/Throat: Mucous membranes are moist.   Neck:  Painless ROM Cardiovascular: Normal rate, regular rhythm.   Good peripheral circulation. Respiratory: Normal respiratory effort.  No retractions.  Genitourinary: deferred Musculoskeletal:   Warm and well perfused Neurologic:  Normal speech and language. No gross focal neurologic deficits are appreciated.  Skin:  Skin is warm, dry and  intact. No rash noted. Psychiatric: Mood and affect are normal. Speech and behavior are normal.  ____________________________________________   LABS (all labs ordered are listed, but only abnormal results are displayed)  Labs Reviewed  CBC - Abnormal; Notable for the following components:      Result Value   RDW 14.8 (*)    All other components within normal limits  COMPREHENSIVE METABOLIC PANEL - Abnormal; Notable for the following components:   Glucose, Bld 160 (*)    All other components within normal limits  GLUCOSE, CAPILLARY - Abnormal; Notable for the following  components:   Glucose-Capillary 145 (*)    All other components within normal limits  PROTIME-INR  APTT  DIFFERENTIAL  TROPONIN I  CBG MONITORING, ED  POC URINE PREG, ED   ____________________________________________  EKG  ED ECG REPORT I, Lavonia Drafts, the attending physician, personally viewed and interpreted this ECG.  Date: 02/11/2018  Rhythm: normal sinus rhythm QRS Axis: normal Intervals: normal ST/T Wave abnormalities: normal Narrative Interpretation: PVC  ____________________________________________  RADIOLOGY  CT head unremarkable, negative for stroke ____________________________________________   PROCEDURES  Procedure(s) performed: No  Procedures   Critical Care performed:No ____________________________________________   INITIAL IMPRESSION / ASSESSMENT AND PLAN / ED COURSE  Pertinent labs & imaging results that were available during my care of the patient were reviewed by me and considered in my medical decision making (see chart for details).  Code stroke called from triage, patient evaluated by neurology who notes that given resolving symptoms no TPA is indicated.  Dr. Doy Mince suspects this is related to migraine headache, recommends treatment for headache as well as MRI in the emergency department.  We will give IV Toradol, IV Reglan, IV Benadryl prior to MRI.  Have  asked Dr. Quentin Cornwall to follow up on MRI results   ____________________________________________   FINAL CLINICAL IMPRESSION(S) / ED DIAGNOSES  Final diagnoses:  Other migraine without status migrainosus, not intractable        Note:  This document was prepared using Dragon voice recognition software and may include unintentional dictation errors.    Lavonia Drafts, MD 02/11/18 [phone]

## 2018-02-11 NOTE — Progress Notes (Signed)
Milo received Code Stroke page for patient's room. CH was with another patient. [facility] phone staff to let them know and that [facility] was available if needed. CH provided prayer outside.

## 2018-02-11 NOTE — Progress Notes (Signed)
CODE STROKE- PHARMACY COMMUNICATION   Time CODE STROKE called/page received:1413  Time response to CODE STROKE was made (in person or via phone): 1414  Time Stroke Kit retrieved from [place] (only if needed):TPA not indicated per neurology  Name of Provider/Nurse contacted:Ashley  Past Medical History:  Diagnosis Date  . Diabetes mellitus    type II controlled  . Hyperlipidemia   . Hypertension   . Migraine    common  . Obesity   . Palpitations   . Plantar fasciitis   . S/P tonsillectomy   . Vertigo    Prior to Admission medications   Medication Sig Start Date End Date Taking? Authorizing Provider  albuterol (PROVENTIL HFA;VENTOLIN HFA) 108 (90 Base) MCG/ACT inhaler Inhale 2 puffs into the lungs every 6 (six) hours as needed for wheezing or shortness of breath. 12/24/16   Briscoe Deutscher, DO  amLODipine (NORVASC) 5 MG tablet Take 1 tablet (5 mg total) by mouth daily. 02/10/18   Tower, Wynelle Fanny, MD  aspirin 81 MG tablet Take 81 mg by mouth daily.      [provider]  atorvastatin (LIPITOR) 20 MG tablet TAKE ONE TABLET BY MOUTH ONCE DAILY 07/09/16   Tower, Wynelle Fanny, MD  cetirizine (ZYRTEC) 10 MG tablet TAKE ONE TABLET BY MOUTH EVERY DAY 02/02/13   Tower, Wynelle Fanny, MD  fluticasone (FLONASE) 50 MCG/ACT nasal spray Place 2 sprays into both nostrils daily. 11/05/17   Jearld Fenton, NP  glucose blood (ONE TOUCH ULTRA TEST) test strip USE ONE STRIP TO CHECK GLUCOSE THREE TIMES DAILY TO 4 TIMES DAILY AS NEEDED Dx. E11.9 07/17/15   Tower, Wynelle Fanny, MD  LEVEMIR FLEXTOUCH 100 UNIT/ML Pen Inject 60 Units into the skin at bedtime.  06/05/17   [provider]  lisinopril (PRINIVIL,ZESTRIL) 20 MG tablet TAKE ONE TABLET BY MOUTH ONCE DAILY 12/13/15   Tower, Wynelle Fanny, MD  metFORMIN (GLUCOPHAGE-XR) 500 MG 24 hr tablet TAKE TWO TABLETS BY MOUTH IN THE EVENING Patient taking differently: TAKE THREE TABLETS BY MOUTH IN THE EVENING 12/13/15   Tower, Wynelle Fanny, MD  PARoxetine (PAXIL) 20 MG tablet Take  1 tablet (20 mg total) by mouth daily. 06/26/17   Tower, Wynelle Fanny, MD  Semaglutide (OZEMPIC) 0.25 or 0.5 MG/DOSE SOPN Inject 0.5 mg into the skin daily. 09/14/17   [provider]  SUMAtriptan (IMITREX) 100 MG tablet Take 1 tablet (100 mg total) by mouth once. May repeat in 2 hours if headache persists or recurs. 06/26/17 06/26/17  Tower, Wynelle Fanny, MD  ipratropium (ATROVENT) 0.03 % nasal spray Place 2 sprays into the nose 3 (three) times daily. 07/08/11 12/04/11  Owens Loffler, MD    Napoleon Form ,PharmD Clinical Pharmacist  02/11/2018  3:43 PM

## 2018-02-11 NOTE — ED Notes (Signed)
Pharmacy notified to send Reglan dose.

## 2018-02-11 NOTE — Consult Note (Signed)
Referring Physician: Corky Downs    Chief Complaint: Left arm and facial numbness  HPI: Stacy Monroe is an 52 y.o. female with a history of migraine.  Previously migraine consists of right facial pain.  Had no focal neurological complaints until Sunday when she had a headache associated with left hand and facial numbness.  Symptoms resolved with treatment of migraine.  Head CT at that time was unremarkable.  Today focal sensory symptoms returned with left facial and left hand numbness.  No headache initially but with continued stay in the ED began to have the onset of headache with some improvement in left sided sensory symptoms.   Initial NIHSS of 1.  Date last known well: Date: 02/11/2018 Time last known well: Time: 14:00 tPA Given: No: Resolution of symptoms  Past Medical History:  Diagnosis Date  . Diabetes mellitus    type II controlled  . Hyperlipidemia   . Hypertension   . Migraine    common  . Obesity   . Palpitations   . Plantar fasciitis   . S/P tonsillectomy   . Vertigo     Past Surgical History:  Procedure Laterality Date  . BLADDER SURGERY  1990   bladder tack after 2nd delivery    Family History  Problem Relation Age of Onset  . Arthritis Mother        RA  . Hypertension Mother   . Drug abuse Mother   . Depression Mother   . Depression Sister   . Cancer Sister        thyroid cancer, reoccured 2008  . Depression Sister   . Drug abuse Brother   . Stroke Maternal Grandmother   . Alzheimer's disease Maternal Grandmother   . Depression Sister    Social History:  reports that she has never smoked. She has never used smokeless tobacco. She reports that she does not drink alcohol or use drugs.  Allergies:  Allergies  Allergen Reactions  . Shellfish Allergy Swelling  . Gabapentin Other (See Comments)    Disoriented    Medications: I have reviewed the patient's current medications. Prior to Admission:  Prior to Admission medications   Medication Sig  Start Date End Date Taking? Authorizing Provider  albuterol (PROVENTIL HFA;VENTOLIN HFA) 108 (90 Base) MCG/ACT inhaler Inhale 2 puffs into the lungs every 6 (six) hours as needed for wheezing or shortness of breath. 12/24/16   Briscoe Deutscher, DO  amLODipine (NORVASC) 5 MG tablet Take 1 tablet (5 mg total) by mouth daily. 02/10/18   Tower, Wynelle Fanny, MD  aspirin 81 MG tablet Take 81 mg by mouth daily.      [provider]  atorvastatin (LIPITOR) 20 MG tablet TAKE ONE TABLET BY MOUTH ONCE DAILY 07/09/16   Tower, Wynelle Fanny, MD  cetirizine (ZYRTEC) 10 MG tablet TAKE ONE TABLET BY MOUTH EVERY DAY 02/02/13   Tower, Wynelle Fanny, MD  fluticasone (FLONASE) 50 MCG/ACT nasal spray Place 2 sprays into both nostrils daily. 11/05/17   Jearld Fenton, NP  glucose blood (ONE TOUCH ULTRA TEST) test strip USE ONE STRIP TO CHECK GLUCOSE THREE TIMES DAILY TO 4 TIMES DAILY AS NEEDED Dx. E11.9 07/17/15   Tower, Wynelle Fanny, MD  LEVEMIR FLEXTOUCH 100 UNIT/ML Pen Inject 60 Units into the skin at bedtime.  06/05/17   [provider]  lisinopril (PRINIVIL,ZESTRIL) 20 MG tablet TAKE ONE TABLET BY MOUTH ONCE DAILY 12/13/15   Tower, Wynelle Fanny, MD  metFORMIN (GLUCOPHAGE-XR) 500 MG 24 hr tablet TAKE  TWO TABLETS BY MOUTH IN THE EVENING Patient taking differently: TAKE THREE TABLETS BY MOUTH IN THE EVENING 12/13/15   Tower, Wynelle Fanny, MD  PARoxetine (PAXIL) 20 MG tablet Take 1 tablet (20 mg total) by mouth daily. 06/26/17   Tower, Wynelle Fanny, MD  Semaglutide (OZEMPIC) 0.25 or 0.5 MG/DOSE SOPN Inject 0.5 mg into the skin daily. 09/14/17   [provider]  SUMAtriptan (IMITREX) 100 MG tablet Take 1 tablet (100 mg total) by mouth once. May repeat in 2 hours if headache persists or recurs. 06/26/17 06/26/17  Tower, Wynelle Fanny, MD  ipratropium (ATROVENT) 0.03 % nasal spray Place 2 sprays into the nose 3 (three) times daily. 07/08/11 12/04/11  Copland, Frederico Hamman, MD    ROS: History obtained from the patient  General ROS: negative for - chills,  fatigue, fever, night sweats, weight gain or weight loss Psychological ROS: negative for - behavioral disorder, hallucinations, memory difficulties, mood swings or suicidal ideation Ophthalmic ROS: negative for - blurry vision, double vision, eye pain or loss of vision ENT ROS: negative for - epistaxis, nasal discharge, oral lesions, sore throat, tinnitus or vertigo Allergy and Immunology ROS: negative for - hives or itchy/watery eyes Hematological and Lymphatic ROS: negative for - bleeding problems, bruising or swollen lymph nodes Endocrine ROS: negative for - galactorrhea, hair pattern changes, polydipsia/polyuria or temperature intolerance Respiratory ROS: cough Cardiovascular ROS: negative for - chest pain, dyspnea on exertion, edema or irregular heartbeat Gastrointestinal ROS: negative for - abdominal pain, diarrhea, hematemesis, nausea/vomiting or stool incontinence Genito-Urinary ROS: negative for - dysuria, hematuria, incontinence or urinary frequency/urgency Musculoskeletal ROS: negative for - joint swelling or muscular weakness Neurological ROS: as noted in HPI Dermatological ROS: negative for rash and skin lesion changes  Physical Examination: Blood pressure (!) 165/108, pulse 78, temperature 98.5 F (36.9 C), temperature source Oral, resp. rate 15, height 5\' 4"  (1.626 m), weight 93 kg (205 lb), last menstrual period 02/01/2018, SpO2 92 %.  HEENT-  Normocephalic, no lesions, without obvious abnormality.  Normal external eye and conjunctiva.  Normal TM's bilaterally.  Normal auditory canals and external ears. Normal external nose, mucus membranes and septum.  Normal pharynx. Cardiovascular- S1, S2 normal, pulses palpable throughout   Lungs- chest clear, no wheezing, rales, normal symmetric air entry Abdomen- soft, non-tender; bowel sounds normal; no masses,  no organomegaly Extremities- no edema Lymph-no adenopathy palpable Musculoskeletal-no joint tenderness, deformity or  swelling Skin-warm and dry, no hyperpigmentation, vitiligo, or suspicious lesions  Neurological Examination   Mental Status: Alert, oriented, thought content appropriate.  Speech fluent without evidence of aphasia.  Able to follow 3 step commands without difficulty.  Patient agitated easily and keeps eyes closed and head turned away during conversation.   Cranial Nerves: II: Discs flat bilaterally; Visual fields grossly normal, pupils equal, round, reactive to light and accommodation III,IV, VI: ptosis not present, extra-ocular motions intact bilaterally V,VII: smile symmetric, facial light touch sensation normal bilaterally VIII: hearing normal bilaterally IX,X: gag reflex present XI: bilateral shoulder shrug XII: midline tongue extension Motor: Right : Upper extremity   5/5    Left:     Upper extremity   5/5  Lower extremity   5/5     Lower extremity   5/5 Tone and bulk:normal tone throughout; no atrophy noted Sensory: Pinprick and light touch intact throughout, bilaterally Deep Tendon Reflexes: 2+ and symmetric throughout Plantars: Right: downgoing   Left: downgoing Cerebellar: Normal finger-to-nose and normal heel-to-shin testing bilaterally Gait: not tested due to safety concerns  Laboratory Studies:  Basic Metabolic Panel: No results for input(s): NA, K, CL, CO2, GLUCOSE, BUN, CREATININE, CALCIUM, MG, PHOS in the last 168 hours.  Liver Function Tests: No results for input(s): AST, ALT, ALKPHOS, BILITOT, PROT, ALBUMIN in the last 168 hours. No results for input(s): LIPASE, AMYLASE in the last 168 hours. No results for input(s): AMMONIA in the last 168 hours.  CBC: Recent Labs  Lab 02/11/18 1425  WBC 8.8  NEUTROABS 5.8  HGB 12.6  HCT 37.2  MCV 86.2  PLT 256    Cardiac Enzymes: No results for input(s): CKTOTAL, CKMB, CKMBINDEX, TROPONINI in the last 168 hours.  BNP: Invalid input(s): POCBNP  CBG: Recent Labs  Lab 02/07/18 2122 02/11/18 1423  GLUCAP 219*  145*    Microbiology: Results for orders placed or performed in visit on 10/31/15  Urine culture     Status: None   Collection Time: 10/31/15  5:01 PM  Result Value Ref Range Status   Colony Count 20,OOO COLONIES/ML  Final   Organism ID, Bacteria Multiple bacterial morphotypes present, none  Final   Organism ID, Bacteria predominant. Suggest appropriate recollection if   Final   Organism ID, Bacteria clinically indicated.  Final    Coagulation Studies: No results for input(s): LABPROT, INR in the last 72 hours.  Urinalysis: No results for input(s): COLORURINE, LABSPEC, PHURINE, GLUCOSEU, HGBUR, BILIRUBINUR, KETONESUR, PROTEINUR, UROBILINOGEN, NITRITE, LEUKOCYTESUR in the last 168 hours.  Invalid input(s): APPERANCEUR  Lipid Panel:    Component Value Date/Time   CHOL 235 (H) 07/09/2015 0950   TRIG 350.0 (H) 07/09/2015 0950   HDL 36.50 (L) 07/09/2015 0950   CHOLHDL 6 07/09/2015 0950   VLDL 70.0 (H) 07/09/2015 0950   LDLCALC 63 04/10/2015 0824    HgbA1C:  Lab Results  Component Value Date   HGBA1C 9.2 (H) 07/09/2015    Urine Drug Screen:  No results found for: LABOPIA, COCAINSCRNUR, LABBENZ, AMPHETMU, THCU, LABBARB  Alcohol Level: No results for input(s): ETH in the last 168 hours.  Other results: EKG: sinus rhythm at 76 bpm.  Imaging: Ct Head Code Stroke Wo Contrast  Result Date: 02/11/2018 CLINICAL DATA:  Code stroke. 52 year old female with left hand and face numbness onset at 1345 hours today. Initially the patient also had slurred speech. EXAM: CT HEAD WITHOUT CONTRAST TECHNIQUE: Contiguous axial images were obtained from the base of the skull through the vertex without intravenous contrast. COMPARISON:  Head CT without contrast 02/07/2018. FINDINGS: Brain: No midline shift, ventriculomegaly, mass effect, evidence of mass lesion, intracranial hemorrhage or evidence of cortically based acute infarction. Gray-white matter differentiation is within normal limits  throughout the brain. Vascular: No suspicious intracranial vascular hyperdensity. Skull: Negative. Sinuses/Orbits: Visualized paranasal sinuses and mastoids are stable and well pneumatized. Other: Visualized orbits and scalp soft tissues are within normal limits. ASPECTS ([place] Stroke Program Early CT Score) - Ganglionic level infarction (caudate, lentiform nuclei, internal capsule, insula, M1-M3 cortex): 7 - Supraganglionic infarction (M4-M6 cortex): 3 Total score (0-10 with 10 being normal): 10 IMPRESSION: 1. Stable and normal noncontrast CT appearance of the brain. 2. ASPECTS is 10. Electronically Signed   By: Genevie Ann M.D.   On: 02/11/2018 14:23    Assessment: 52 y.o. female with history of migraine presenting with migraine and new focal neurological symptoms.  Although symptoms appear related to migraine with them being of new onset, patient will likely benefit from further evaluation.    Stroke Risk Factors - diabetes mellitus, hyperlipidemia and hypertension  Plan:  1. Analgesia for headache 2. MRI of the brain without contrast 3. If MRI unremarkable and headache resolved patient to continue follow up on an outpatient basis 4. Telemetry monitoring 5. Frequent neuro checks  Case discussed with Dr. Carma Lair, MD Neurology [phone] 02/11/2018, 2:45 PM

## 2018-02-11 NOTE — Code Documentation (Signed)
Pt arrives with husband via POV with complaints of left middle and pointer finger numbness that began at 1400 and progressed to her face, code stroke activated in triage, pt cleared for CT by Dr. Lucita Lora at [phone], pt taken to CT for non con head CT and then to room 26, husband at bedside, NIHSS 1 with left sided numbness, pt states numbness was resolving at time of neuro eval and headache was beginning, no tPA given due to resolving symptoms, pt states hx of migraines with numbness, was seen in ED recently for migraine with numbness, report off to [employer]

## 2018-02-11 NOTE — ED Triage Notes (Signed)
Arrives with C/O left hand and left facial numbness, onset of symptoms 1345.  Spouse states initially patient had slurred speech.

## 2018-02-11 NOTE — ED Notes (Signed)
Patient transported to MRI 

## 2018-02-11 NOTE — Discharge Instructions (Signed)

## 2018-02-11 NOTE — ED Provider Notes (Signed)
Patient received in sign-out from Dr. Corky Downs.  Workup and evaluation pending MRI results and reassessment.  MRI is fortunately reassuring.  Patient still with persistent headache.  States that she had significant improvement with Compazine previously given to her.  Will re-dose this as well as give IV magnesium and IV Decadron.   Patient resting comfortably.  Headache has improved.  Patient stable and appropriate for outpatient follow-up.   Merlyn Lot, MD 02/11/18 2003

## 2018-02-12 ENCOUNTER — Telehealth: Payer: Self-pay | Admitting: Family Medicine

## 2018-02-12 ENCOUNTER — Encounter: Payer: Self-pay | Admitting: Family Medicine

## 2018-02-12 DIAGNOSIS — G43009 Migraine without aura, not intractable, without status migrainosus: Secondary | ICD-10-CM

## 2018-02-12 NOTE — Telephone Encounter (Signed)
Spoke with patient, she may make her own Neurology Appt and will call me back if she does so we can fax the records there. Will wait to hear back from the patient if she wants [place] to refer her.

## 2018-02-12 NOTE — Telephone Encounter (Signed)
I spoke with pt and per d/c note from hospital pt needs ED f/u with Dr Glori Bickers. Pt said was seen at [facility] ED on 02/07/18 and 02/11/18. Pt had ED f/u with Dr Glori Bickers on 02/10/18 and request note sent to Dr Glori Bickers about neurology referral rather than making another ED f/u appt with Dr Glori Bickers. Pt request cb. Today pt said she is starting with rt side of head and ears hurting; pain level now 1-2.Please advise.

## 2018-02-12 NOTE — Telephone Encounter (Signed)
Ref done for neurology  Will route to [facility]  Please let pt know

## 2018-02-12 NOTE — Telephone Encounter (Signed)
Pt notified as instructed and voiced understanding.fYI to [facility].

## 2018-02-12 NOTE — Telephone Encounter (Signed)
Copied from [place] [id number]. Topic: General - Other >> Feb 12, 2018  9:25 AM Margot Ables wrote: Reason for CRM: Pt has been having migraines and high BP. Yesterday she had a numb feeling and went to the ER. She said she was told to notify provider that she had to go back to ER. Pt was advised to find a neurologist and is requesting referral/advice.

## 2018-02-14 NOTE — Assessment & Plan Note (Signed)
BP is elevated-potentially worsening headaches  Add amlodipine 5 mg daily  Disc lifestyle change- DASH eating/good water intake

## 2018-02-14 NOTE — Assessment & Plan Note (Signed)
Increased paxil for mood and sleep last time  Some mild improvement  Job change has helped as well  Reviewed stressors/ coping techniques/symptoms/ support sources/ tx options and side effects in detail today

## 2018-02-14 NOTE — Assessment & Plan Note (Signed)
Worse lately  Reviewed hospital records, lab results and studies in detail  (ED visit) Reassuring CT of the head  Multiple triggers incl stress/ hormone change/allergies and recent ear pain  Self care is fair  Disc lifestyle change Will work on bp control to see if this helps  If not- she is open to discussing proph medications

## 2018-02-14 NOTE — Assessment & Plan Note (Signed)
Continue endocrinology f/u

## 2018-02-15 NOTE — Telephone Encounter (Signed)
Recently worsening migraines Urgent referral to neurology Will route to [facility]  Has been seen in ED several times

## 2018-02-16 ENCOUNTER — Telehealth: Payer: Self-pay | Admitting: Family Medicine

## 2018-02-16 DIAGNOSIS — E1165 Type 2 diabetes mellitus with hyperglycemia: Secondary | ICD-10-CM

## 2018-02-16 NOTE — Telephone Encounter (Signed)
Ref done  Thanks  Will route back to [facility]

## 2018-02-16 NOTE — Telephone Encounter (Signed)
Patient came in person and requests a New Endocrinology Referral to Dr Dwyane Dee with [facility]. Please place referral, she has been seeing Dr Gabriel Carina in [place] but wishes to transfer to [employer]. Called [facility] and got the patient seen for Neurology with Dr Krista Blue for this Thursday.

## 2018-02-18 ENCOUNTER — Encounter: Payer: Self-pay | Admitting: Neurology

## 2018-02-18 ENCOUNTER — Ambulatory Visit: Payer: BLUE CROSS/BLUE SHIELD | Admitting: Neurology

## 2018-02-18 VITALS — BP 122/79 | HR 89 | Ht 64.0 in | Wt 196.5 lb

## 2018-02-18 DIAGNOSIS — IMO0002 Reserved for concepts with insufficient information to code with codable children: Secondary | ICD-10-CM | POA: Insufficient documentation

## 2018-02-18 DIAGNOSIS — R202 Paresthesia of skin: Secondary | ICD-10-CM | POA: Diagnosis not present

## 2018-02-18 DIAGNOSIS — M542 Cervicalgia: Secondary | ICD-10-CM | POA: Diagnosis not present

## 2018-02-18 DIAGNOSIS — G43709 Chronic migraine without aura, not intractable, without status migrainosus: Secondary | ICD-10-CM | POA: Diagnosis not present

## 2018-02-18 MED ORDER — RIZATRIPTAN BENZOATE 10 MG PO TBDP: 10.0000 mg | ORAL_TABLET | ORAL | 6 refills | Status: AC | PRN

## 2018-02-18 MED ORDER — ONDANSETRON 4 MG PO TBDP: 4.0000 mg | ORAL_TABLET | Freq: Three times a day (TID) | ORAL | 6 refills | Status: DC | PRN

## 2018-02-18 NOTE — Progress Notes (Signed)
PATIENT: Stacy Monroe DOB: 1965/11/19  Chief Complaint  Patient presents with  . New Patient (Initial Visit)    Patient her with husband. She reports that she was seen in the ER last week for "stroke-like symptoms"     HISTORICAL  Stacy Monroe is a 52 years old female, seen in refer by her primary care physician Dr.  Glori Bickers, [facility] for evaluation of "stroke-like" symptoms.  Initial evaluation was on Feb 18, 2018  I reviewed and summarized the referring note, she has history of hypertension, diabetes since 2017, hyperlipidemia,   She reported a history of chronic migraine headaches, is usually triggered along her menstruation,  holoacranial severe pounding headaches with associated light, noise sensitivity,nauseous, movement made it worse.  Previously she responded well to Relpax, but insurance no longer covers it, she was giving a prescription of Imitrex, over the past few weeks, she has tried Imitrex 100 mg 2 tablets in 1 day on 3 occasions, without significantly helped her headaches,  In early May 2019, correlate with her worsening diabetes, titration of ozempic she noticed increased headaches, she had a headache for 2 weeks, without responding to Imitrex, has associated right-sided ear pain, eventually presented to the emergency room on Feb 07, 2018, responded very well to cocktail Benadryl, Toradol.  on Feb 11, 2018, she had sudden onset of left hand paresthesia, spreading to left arm and left face within 5 minutes, she was also noted to have mild slurred speech, has to be helped to get into the emergency room, followed by moderate holocranial headache, that responded well again with iv cocktail.  MRI of the brain showed no significant abnormality.  Laboratory evaluation showed negative troponin, CMP showed elevated glucose 160, no significant abnormality on CBC,  REVIEW OF SYSTEMS: Full 14 system review of systems performed and notable only for as  above.  ALLERGIES: Allergies  Allergen Reactions  . Shellfish Allergy Swelling  . Gabapentin Other (See Comments)    Disoriented    HOME MEDICATIONS: Current Outpatient Medications  Medication Sig Dispense Refill  . albuterol (PROVENTIL HFA;VENTOLIN HFA) 108 (90 Base) MCG/ACT inhaler Inhale 2 puffs into the lungs every 6 (six) hours as needed for wheezing or shortness of breath. 1 Inhaler 2  . amLODipine (NORVASC) 5 MG tablet Take 1 tablet (5 mg total) by mouth daily. 30 tablet 11  . aspirin 81 MG tablet Take 81 mg by mouth daily.      Marland Kitchen atorvastatin (LIPITOR) 20 MG tablet TAKE ONE TABLET BY MOUTH ONCE DAILY 90 tablet 0  . cetirizine (ZYRTEC) 10 MG tablet TAKE ONE TABLET BY MOUTH EVERY DAY 30 tablet 2  . fluticasone (FLONASE) 50 MCG/ACT nasal spray Place 2 sprays into both nostrils daily. 16 g 1  . glucose blood (ONE TOUCH ULTRA TEST) test strip USE ONE STRIP TO CHECK GLUCOSE THREE TIMES DAILY TO 4 TIMES DAILY AS NEEDED Dx. E11.9 100 each 5  . LEVEMIR FLEXTOUCH 100 UNIT/ML Pen Inject 60 Units into the skin at bedtime.     Marland Kitchen lisinopril (PRINIVIL,ZESTRIL) 20 MG tablet TAKE ONE TABLET BY MOUTH ONCE DAILY 90 tablet 0  . metFORMIN (GLUCOPHAGE-XR) 500 MG 24 hr tablet Take 1,500 mg by mouth every evening.    Marland Kitchen omeprazole (PRILOSEC OTC) 20 MG tablet Take 20 mg by mouth daily.    Marland Kitchen PARoxetine (PAXIL) 20 MG tablet Take 1 tablet (20 mg total) by mouth daily. 30 tablet 11  . Semaglutide (OZEMPIC) 0.25 or 0.5 MG/DOSE  SOPN Inject 0.5 mg into the skin once a week.      Current Facility-Administered Medications  Medication Dose Route Frequency Provider Last Rate Last Dose  . methylPREDNISolone acetate (DEPO-MEDROL) injection 80 mg  80 mg Intramuscular Once Jearld Fenton, NP        PAST MEDICAL HISTORY: Past Medical History:  Diagnosis Date  . Diabetes mellitus    type II controlled  . Hyperlipidemia   . Hypertension   . Migraine    common  . Obesity   . Palpitations   . Plantar fasciitis    . S/P tonsillectomy   . Vertigo     PAST SURGICAL HISTORY: Past Surgical History:  Procedure Laterality Date  . BLADDER SURGERY  1990   bladder tack after 2nd delivery    FAMILY HISTORY: Family History  Problem Relation Age of Onset  . Arthritis Mother        RA  . Hypertension Mother   . Drug abuse Mother   . Depression Mother   . Depression Sister   . Cancer Sister        thyroid cancer, reoccured 2008  . Depression Sister   . Drug abuse Brother   . Stroke Maternal Grandmother   . Alzheimer's disease Maternal Grandmother   . Depression Sister     SOCIAL HISTORY:  Social History   Socioeconomic History  . Marital status: Married    Spouse name: Not on file  . Number of children: Not on file  . Years of education: Not on file  . Highest education level: Not on file  Occupational History  . Not on file  Social Needs  . Financial resource strain: Not on file  . Food insecurity:    Worry: Not on file    Inability: Not on file  . Transportation needs:    Medical: Not on file    Non-medical: Not on file  Tobacco Use  . Smoking status: Never Smoker  . Smokeless tobacco: Never Used  Substance and Sexual Activity  . Alcohol use: No    Alcohol/week: 0.0 oz  . Drug use: No  . Sexual activity: Not on file  Lifestyle  . Physical activity:    Days per week: Not on file    Minutes per session: Not on file  . Stress: Not on file  Relationships  . Social connections:    Talks on phone: Not on file    Gets together: Not on file    Attends religious service: Not on file    Active member of club or organization: Not on file    Attends meetings of clubs or organizations: Not on file    Relationship status: Not on file  . Intimate partner violence:    Fear of current or ex partner: Not on file    Emotionally abused: Not on file    Physically abused: Not on file    Forced sexual activity: Not on file  Other Topics Concern  . Not on file  Social History  Narrative  . Not on file     PHYSICAL EXAM   Vitals:   02/18/18 0910  BP: 122/79  Pulse: 89  Weight: 196 lb 8 oz (89.1 kg)  Height: 5\' 4"  (1.626 m)    Not recorded      Body mass index is 33.73 kg/m.  PHYSICAL EXAMNIATION:  Gen: NAD, conversant, well nourised, obese, well groomed  Cardiovascular: Regular rate rhythm, no peripheral edema, warm, nontender. Eyes: Conjunctivae clear without exudates or hemorrhage Neck: Supple, no carotid bruits. Tenderness along right nuchal line, Pulmonary: Clear to auscultation bilaterally   NEUROLOGICAL EXAM:  MENTAL STATUS: Speech:    Speech is normal; fluent and spontaneous with normal comprehension.  Cognition:     Orientation to time, place and person     Normal recent and remote memory     Normal Attention span and concentration     Normal Language, naming, repeating,spontaneous speech     Fund of knowledge   CRANIAL NERVES: CN II: Visual fields are full to confrontation. Fundoscopic exam is normal with sharp discs and no vascular changes. Pupils are round equal and briskly reactive to light. CN III, IV, VI: extraocular movement are normal. No ptosis. CN V: Facial sensation is intact to pinprick in all 3 divisions bilaterally. Corneal responses are intact.  CN VII: Face is symmetric with normal eye closure and smile. CN VIII: Hearing is normal to rubbing fingers CN IX, X: Palate elevates symmetrically. Phonation is normal. CN XI: Head turning and shoulder shrug are intact CN XII: Tongue is midline with normal movements and no atrophy.  MOTOR: There is no pronator drift of out-stretched arms. Muscle bulk and tone are normal. Muscle strength is normal.  REFLEXES: Reflexes are 2+ and symmetric at the biceps, triceps, knees, and ankles. Plantar responses are flexor.  SENSORY: Intact to light touch, pinprick, positional sensation and vibratory sensation are intact in fingers and toes.  COORDINATION: Rapid  alternating movements and fine finger movements are intact. There is no dysmetria on finger-to-nose and heel-knee-shin.    GAIT/STANCE: Posture is normal. Gait is steady with normal steps, base, arm swing, and turning. Heel and toe walking are normal. Tandem gait is normal.  Romberg is absent.   DIAGNOSTIC DATA (LABS, IMAGING, TESTING) - I reviewed patient records, labs, notes, testing and imaging myself where available.   ASSESSMENT AND PLAN  Stacy Monroe is a 52 y.o. female   Chronic migraine The episode of left-sided paresthesia most consistent with complicated migraine headaches,  Normal MRI of the brain A component of cervicogenic headache.  I just performed trigger point injection today  Suboptimal response to Imitrex injection  Will try rizatriptan prn and zofran prn  Marcial Pacas, M.D. Ph.D.  [facility] [address] [place], [place] [address] Ph: [phone] Fax: [phone]  CC: Tower, Wynelle Fanny, MD

## 2018-02-18 NOTE — Progress Notes (Signed)
Trigger point injection for persistent headaches for more than 2 weeks  1.5 cc of 0.5% Marcaine mixed together was 1.5 cc of betamethasone (6 mg/1 mL)  Multiple injection was performed along the right nuchal like, where she has the most tenderness and right upper cervical paraspinal muscles.  She complains of lightheadedness for injection, improved after lying down resting, blood pressure was 130/90, heart rate of 80.

## 2018-02-26 ENCOUNTER — Encounter: Payer: Self-pay | Admitting: Family Medicine

## 2018-02-28 ENCOUNTER — Encounter: Payer: Self-pay | Admitting: Endocrinology

## 2018-02-28 NOTE — Progress Notes (Signed)
Patient ID: Stacy Monroe, female   DOB: 1966-04-17, 52 y.o.   MRN: [id number]          Reason for Appointment: Consultation for Type 2 Diabetes  Referring physician: Dr. Jerilynn Mages.  [place]   History of Present Illness:          Date of diagnosis of type 2 diabetes mellitus:   2009      Background history:  She initially was found to have prediabetes about 10 years ago Subsequently was started on metformin and also tried on Amaryl.  Although she had initial difficulty with GI disturbance with metformin and subsequently she was able to tolerate it at lower doses At some point she was also given Victoza In 2017 because of worsening blood sugar control she was started on Lantus in addition and this helped her control Her blood sugar control was fairly good until 9/18 when her blood sugar was higher and A1c 8.2 Subsequently was switched from Victoza to [place] in 08/2017  Recent history:   INSULIN regimen is:  Levemir 60  units at bedtime     Non-insulin hypoglycemic drugs the patient is taking are: Metformin ER 1500 daily, Ozempic 1 mg weekly  Current management, blood sugar patterns and problems identified:   She was switched from Victoza to [place] when her sugars were higher late last year but blood sugars do not improve  Also in the last month has taken 1 mg Ozempic without any improvement in her blood sugars  She has variable readings at all times including in the morning although generally her blood sugars are higher between morning and nighttime  She usually has small amounts of portions at all meals and this is not any different with changing from Victoza to Ozempic  Highest blood sugars are after supper but are variable  She is not motivated to exercise  More recently has been trying to eat 3 meals a day although does usually eat out once a day  She is trying to cut back on carbohydrates but does not always make low-fat choices  Recently has lost a little weight          Side effects from medications have been: None  Compliance with the medical regimen: Variable  Hypoglycemia:   None  Glucose monitoring:  done 3-4 times a day         Glucometer: One Touch.      Blood Glucose readings by time of day and averages from meter download:  PREMEAL Breakfast Lunch Dinner Bedtime  Overall   Glucose range:  95-417    95-528  Median:  [phone]  [phone]  [phone]   [phone]   POST-MEAL PC Breakfast PC Lunch PC Dinner  Glucose range:    200-528  Median:    [phone]   Self-care: The diet that the patient has been following is: tries to limit fried food and regular soft drinks.  Also avoiding any diet sodas     Typical meal intake: Breakfast is breakfast sandwich or eggs and toast              Dietician visit, most recent: Years ago               Exercise:  None  Weight history: Maximum about 225  Wt Readings from Last 3 Encounters:  03/01/18 199 lb 3.2 oz (90.4 kg)  02/18/18 196 lb 8 oz (89.1 kg)  02/11/18 205 lb (93 kg)    Glycemic control:   Lab  Results  Component Value Date   HGBA1C 9.4 01/25/2018   HGBA1C 9.2 (H) 07/09/2015   HGBA1C 7.5 (H) 04/10/2015   Lab Results  Component Value Date   LDLCALC 63 04/10/2015   CREATININE 0.67 02/11/2018   No results found for: MICRALBCREAT  No results found for: FRUCTOSAMINE    Allergies as of 03/01/2018      Reactions   Shellfish Allergy Swelling   Gabapentin Other (See Comments)   Disoriented      Medication List        Accurate as of 03/01/18  1:08 PM. Always use your most recent med list.          albuterol 108 (90 Base) MCG/ACT inhaler Commonly known as:  PROVENTIL HFA;VENTOLIN HFA Inhale 2 puffs into the lungs every 6 (six) hours as needed for wheezing or shortness of breath.   amLODipine 5 MG tablet Commonly known as:  NORVASC Take 1 tablet (5 mg total) by mouth daily.   aspirin 81 MG tablet Take 81 mg by mouth daily.   atorvastatin 20 MG tablet Commonly known as:  LIPITOR TAKE ONE TABLET  BY MOUTH ONCE DAILY   cetirizine 10 MG tablet Commonly known as:  ZYRTEC TAKE ONE TABLET BY MOUTH EVERY DAY   fluticasone 50 MCG/ACT nasal spray Commonly known as:  FLONASE Place 2 sprays into both nostrils daily.   glucose blood test strip Commonly known as:  ONE TOUCH ULTRA TEST USE ONE STRIP TO CHECK GLUCOSE THREE TIMES DAILY TO 4 TIMES DAILY AS NEEDED Dx. E11.9   insulin aspart 100 UNIT/ML FlexPen Commonly known as:  NOVOLOG FLEXPEN 14 units actid   Insulin Degludec 200 UNIT/ML Sopn Commonly known as:  TRESIBA FLEXTOUCH Inject 60 Units into the skin daily.   lisinopril 20 MG tablet Commonly known as:  PRINIVIL,ZESTRIL TAKE ONE TABLET BY MOUTH ONCE DAILY   metFORMIN 500 MG 24 hr tablet Commonly known as:  GLUCOPHAGE-XR Take 1,500 mg by mouth every evening.   omeprazole 20 MG tablet Commonly known as:  PRILOSEC OTC Take 20 mg by mouth daily.   ondansetron 4 MG disintegrating tablet Commonly known as:  ZOFRAN ODT Take 1 tablet (4 mg total) by mouth every 8 (eight) hours as needed.   OZEMPIC 0.25 or 0.5 MG/DOSE Sopn Generic drug:  Semaglutide Inject 0.5 mg into the skin once a week.   PARoxetine 20 MG tablet Commonly known as:  PAXIL Take 1 tablet (20 mg total) by mouth daily.   rizatriptan 10 MG disintegrating tablet Commonly known as:  MAXALT-MLT Take 1 tablet (10 mg total) by mouth as needed. May repeat in 2 hours if needed       Allergies:  Allergies  Allergen Reactions  . Shellfish Allergy Swelling  . Gabapentin Other (See Comments)    Disoriented    Past Medical History:  Diagnosis Date  . Diabetes mellitus    type II controlled  . Hyperlipidemia   . Hypertension   . Migraine    common  . Obesity   . Palpitations   . Plantar fasciitis   . S/P tonsillectomy   . Vertigo     Past Surgical History:  Procedure Laterality Date  . BLADDER SURGERY  1990   bladder tack after 2nd delivery    Family History  Problem Relation Age of Onset    . Arthritis Mother        RA  . Hypertension Mother   . Drug abuse Mother   .  Depression Mother   . Depression Sister   . Diabetes Sister   . Cancer Sister        thyroid cancer, reoccured 2008  . Depression Sister   . Drug abuse Brother   . Stroke Maternal Grandmother   . Alzheimer's disease Maternal Grandmother   . Diabetes Maternal Grandmother   . Depression Sister     Social History:  reports that she has never smoked. She has never used smokeless tobacco. She reports that she does not drink alcohol or use drugs.   Review of Systems  Constitutional: Positive for weight loss.  HENT: Positive for headaches.        Has had more migraines recently  Eyes: Negative for blurred vision.  Cardiovascular: Negative for leg swelling.  Gastrointestinal: Positive for constipation and diarrhea.       Has mild symptoms  Endocrine: Positive for fatigue.       Menstrual cycles have been irregular for several months  Genitourinary: Positive for frequency.  Skin: Positive for itching.       Takes Zyrtec daily for itching  Neurological:       May have a little burning in her fingers or feet.  Periodically has had tingling in her hands  Psychiatric/Behavioral: Positive for depressed mood, nervousness and insomnia.       Recently having insomnia, depression and anxiety controlled with Paxil     Lipid history: On treatment since about 2009, no recent labs available with last LDL in 3/18 being 170 with outside physician    Lab Results  Component Value Date   CHOL 235 (H) 07/09/2015   HDL 36.50 (L) 07/09/2015   LDLCALC 63 04/10/2015   LDLDIRECT 161.0 07/09/2015   TRIG 350.0 (H) 07/09/2015   CHOLHDL 6 07/09/2015           Hypertension: Has been present for several years requiring multiple drugs and only recently blood pressure readings are better  BP Readings from Last 3 Encounters:  03/01/18 112/70  02/18/18 122/79  02/11/18 (!) 175/83    Most recent eye exam was in  2017  Most recent foot exam: 02/2018    LABS:  Office Visit on 03/01/2018  Component Date Value Ref Range Status  . Hemoglobin A1C 01/25/2018 9.4   Final    Physical Examination:  BP 112/70 (BP Location: Left Arm, Patient Position: Sitting, Cuff Size: Normal)   Pulse 82   Ht 5\' 4"  (1.626 m)   Wt 199 lb 3.2 oz (90.4 kg)   LMP 02/01/2018 (Approximate)   SpO2 92%   BMI 34.19 kg/m   GENERAL:         Patient has generalized obesity.    HEENT:         Eye exam shows normal external appearance.  Fundus exam shows no retinopathy.  Oral exam shows normal mucosa .  NECK:   There is no lymphadenopathy   Thyroid is not enlarged and no nodules felt.  Carotids are normal to palpation and no bruit heard  LUNGS:         Chest is symmetrical. Lungs are clear to auscultation.Marland Kitchen   HEART:         Heart sounds:  S1 and S2 are normal. No murmur or click heard., no S3 or S4.   ABDOMEN:   There is no distention present. Liver and spleen are not palpable. No other mass or tenderness present.    NEUROLOGICAL:   Ankle jerks are normal bilaterally.  Diabetic Foot Exam - Simple   Simple Foot Form Diabetic Foot exam was performed with the following findings:  Yes   Visual Inspection No deformities, no ulcerations, no other skin breakdown bilaterally:  Yes Sensation Testing Intact to touch and monofilament testing bilaterally:  Yes Pulse Check Posterior Tibialis and Dorsalis pulse intact bilaterally:  Yes See comments:  Yes Comments Decreased right dorsalis pedis but normal posterior tibialis            Vibration sense is mildly reduced in distal first toes.  MUSCULOSKELETAL:  There is no swelling or deformity of the peripheral joints.     EXTREMITIES:     There is no edema.  SKIN:       No rash or lesions of concern.        ASSESSMENT:  Diabetes type 2, uncontrolled with last A1c 9.4  She has had progressive worsening of her diabetes control with A1c persistently high since  05/2017 This is with taking relatively high doses of basal insulin, metformin and Ozempic     He appears to have increased insulin deficiency with mostly postprandial hyperglycemia She can however do much better with making lifestyle changes especially starting exercise She is not always restricting carbohydrates and fats and needs evaluation by dietitian  Complications of diabetes: None evident although has mild symptoms suggestive of neuropathy which are inconsistent Needs follow-up eye exam  HYPERLIPIDEMIA: Appears to have had inconsistent control when needs follow-up labs on the next visit  HYPERTENSION: Managed by PCP and appears well controlled recently  PLAN:    Switch from Levemir to [place] U-200, this will be more efficacious and have better 24-hour control  Given her flowsheet to titrate the [place] starting at 60 units once a day, she will adjust the dose every 3 days by 4 units until her fasting readings are below 130  Discussed need to keep her postprandial readings controlled at the same time also  Today discussed in detail the need for mealtime insulin to cover postprandial spikes, action of mealtime insulin, use of the insulin pen, timing and action of the rapid acting insulin as well as starting dose and dosage titration to target the two-hour reading of under 180  She will start with NOVOLOG and use 6 units at breakfast and lunch and 10 units at dinnertime.  To start with may just use a suppertime coverage and then move on to other meal coverage also  Written instructions given for postprandial blood sugar targets, dosage adjustment by 2 units and need for regular monitoring after meals  She does need to start walking or other exercise program  Stay on Ozempic and metformin  Consider Invokana or other SGLT2 drug that will allow [place] to get better blood sugar control, reduce weight, insulin requirement and easy to control her blood pressure also.  Also given her basic  information on freestyle libre system for more convenient and more complete blood sugar monitoring and she will check on the coverage for this  Follow-up with diabetes educator in 2 weeks  Consultation with dietitian  Follow-up back in the office in 4 weeks  Patient Instructions  Check blood sugars on waking up  daily  Also check blood sugars about 2 hours after a meal and do this after different meals by rotation  Recommended blood sugar levels on waking up is 90-130 and about 2 hours after meal is 130-160  Please bring your blood sugar monitor to each visit, thank you Marland Kitchen   Tyler Aas  insulin: This insulin provides blood sugar control for up to 24 hours.   Start with 60 units at bedtime daily and increase by 4 units every 3 days until the waking up sugars are under 130. Then continue the same dose.  If blood sugar is under 90 for 2 days in a row, reduce the dose by 2 units.  Note that this insulin does not control the rise of blood sugar with meals     Novolog before meals 6--6--10 units    Consultation note has been sent to the referring physician  Counseling time on subjects discussed in assessment and plan sections is over 50% of today's 60 minute visit   Elayne Snare 03/01/2018, 1:08 PM   Note: This office note was prepared with Dragon voice recognition system technology. Any transcriptional errors that result from this process are unintentional.

## 2018-03-01 ENCOUNTER — Ambulatory Visit: Payer: BLUE CROSS/BLUE SHIELD | Admitting: Endocrinology

## 2018-03-01 ENCOUNTER — Encounter: Payer: Self-pay | Admitting: Endocrinology

## 2018-03-01 VITALS — BP 112/70 | HR 82 | Ht 64.0 in | Wt 199.2 lb

## 2018-03-01 DIAGNOSIS — E782 Mixed hyperlipidemia: Secondary | ICD-10-CM

## 2018-03-01 DIAGNOSIS — E669 Obesity, unspecified: Secondary | ICD-10-CM | POA: Diagnosis not present

## 2018-03-01 DIAGNOSIS — E1165 Type 2 diabetes mellitus with hyperglycemia: Secondary | ICD-10-CM | POA: Diagnosis not present

## 2018-03-01 DIAGNOSIS — Z794 Long term (current) use of insulin: Secondary | ICD-10-CM

## 2018-03-01 DIAGNOSIS — E1142 Type 2 diabetes mellitus with diabetic polyneuropathy: Secondary | ICD-10-CM | POA: Diagnosis not present

## 2018-03-01 MED ORDER — INSULIN DEGLUDEC 200 UNIT/ML ~~LOC~~ SOPN: 60.0000 [IU] | PEN_INJECTOR | Freq: Every day | SUBCUTANEOUS | 1 refills | Status: DC

## 2018-03-01 MED ORDER — INSULIN ASPART 100 UNIT/ML FLEXPEN: PEN_INJECTOR | SUBCUTANEOUS | 1 refills | Status: DC

## 2018-03-01 NOTE — Patient Instructions (Addendum)
Check blood sugars on waking up  daily  Also check blood sugars about 2 hours after a meal and do this after different meals by rotation  Recommended blood sugar levels on waking up is 90-130 and about 2 hours after meal is 130-160  Please bring your blood sugar monitor to each visit, thank you \   Tyler Aas insulin: This insulin provides blood sugar control for up to 24 hours.   Start with 60 units at bedtime daily and increase by 4 units every 3 days until the waking up sugars are under 130. Then continue the same dose.  If blood sugar is under 90 for 2 days in a row, reduce the dose by 2 units.  Note that this insulin does not control the rise of blood sugar with meals     Novolog before meals 6--6--10 units

## 2018-03-08 ENCOUNTER — Ambulatory Visit: Payer: Self-pay | Admitting: Neurology

## 2018-03-09 ENCOUNTER — Ambulatory Visit: Payer: BLUE CROSS/BLUE SHIELD | Admitting: Family Medicine

## 2018-03-09 ENCOUNTER — Encounter: Payer: Self-pay | Admitting: Family Medicine

## 2018-03-09 VITALS — BP 118/70 | HR 66 | Temp 98.1°F | Ht 64.0 in | Wt 206.5 lb

## 2018-03-09 DIAGNOSIS — F4323 Adjustment disorder with mixed anxiety and depressed mood: Secondary | ICD-10-CM | POA: Diagnosis not present

## 2018-03-09 DIAGNOSIS — IMO0002 Reserved for concepts with insufficient information to code with codable children: Secondary | ICD-10-CM

## 2018-03-09 DIAGNOSIS — Z1231 Encounter for screening mammogram for malignant neoplasm of breast: Secondary | ICD-10-CM | POA: Diagnosis not present

## 2018-03-09 DIAGNOSIS — I10 Essential (primary) hypertension: Secondary | ICD-10-CM | POA: Diagnosis not present

## 2018-03-09 DIAGNOSIS — E1165 Type 2 diabetes mellitus with hyperglycemia: Secondary | ICD-10-CM

## 2018-03-09 DIAGNOSIS — G43709 Chronic migraine without aura, not intractable, without status migrainosus: Secondary | ICD-10-CM | POA: Diagnosis not present

## 2018-03-09 NOTE — Patient Instructions (Addendum)
Don't forget to schedule your yearly eye exam -- let us know if you need a referral   Stop at check out for mammogram referral   BP is great Continue current medicines  Glad you are doing better with headaches also

## 2018-03-09 NOTE — Assessment & Plan Note (Signed)
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

## 2018-03-09 NOTE — Assessment & Plan Note (Signed)
Ref for [facility] mammogram [facility]

## 2018-03-09 NOTE — Assessment & Plan Note (Signed)
Had visit with neuro/ Dr Krista Blue Headaches are much improved  Has zofran/triptan prn  With better bp control and less stress-improved  Enc good lifestyle habits

## 2018-03-09 NOTE — Progress Notes (Signed)
Subjective:    Patient ID: Stacy Monroe, female    DOB: Mar 28, 1966, 52 y.o.   MRN: [id number]  HPI Here for f/u of chronic medical problems  Wt Readings from Last 3 Encounters:  03/09/18 206 lb 8 oz (93.7 kg)  03/01/18 199 lb 3.2 oz (90.4 kg)  02/18/18 196 lb 8 oz (89.1 kg)   35.45 kg/m    bp is stable today  No cp or palpitations or headaches or edema  No side effects to medicines  BP Readings from Last 3 Encounters:  03/09/18 118/70  03/01/18 112/70  02/18/18 122/79    Last visit started amlodipine for bp -no side effects  Also taking lisinopril 20 mg   Re check 122/80   Emailed me her home readings- a lot better and she feels better  Does not need to check often now  No low bp   Feels a lot better   Chronic migraine  Saw neuro-Dr Krista Blue Given new triptan and zofran Not nearly as bad as they were    DM sees endocrinology Saw Dr Dwyane Dee  Lab Results  Component Value Date   HGBA1C 9.4 01/25/2018  on new basal insulin -working better than the 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 Screening mammogram, encounter for 03/09/2018  . Chronic migraine 02/18/2018  . Paresthesia 02/18/2018  . Neck pain on right side 02/18/2018  . Allergic rhinitis 06/26/2017  . Ear pain, bilateral 08/07/2016  . Shingles 08/06/2016  . Perimenopause 03/10/2016  . Neck pain 10/16/2015  . Colon cancer screening 01/10/2015  . Routine general medical examination at a health care facility 01/02/2015  . Menorrhagia 06/14/2014  . Low back pain 11/25/2013  . Abnormal EKG 01/08/2012  . Chest pressure 12/04/2011  . Adjustment disorder  with mixed anxiety and depressed mood 06/24/2011  . Elevated transaminase level 01/21/2011  . PLANTAR FASCIITIS 10/02/2010  . Vitamin D deficiency 04/26/2010  . Essential hypertension 02/15/2009  . Diabetes mellitus type 2, uncontrolled ([place]) 09/07/2008  . Hyperlipidemia 09/07/2008  . Obesity 04/24/2008  . Migraine without aura 04/18/2008   Past Medical History:  Diagnosis Date  . Diabetes mellitus    type II controlled  . Hyperlipidemia   . Hypertension   . Migraine    common  . Obesity   . Palpitations   . Plantar fasciitis   . S/P tonsillectomy   . Vertigo    Past Surgical History:  Procedure Laterality Date  . BLADDER SURGERY  1990   bladder tack after 2nd delivery   Social History   Tobacco Use  . Smoking status: Never Smoker  . Smokeless tobacco: Never Used  Substance Use Topics  . Alcohol use: No    Alcohol/week: 0.0 oz  . Drug use: No   Family History  Problem Relation Age of Onset  . Arthritis Mother        RA  . Hypertension Mother   . Drug abuse Mother   . Depression Mother   . Depression Sister   . Diabetes Sister   . Cancer Sister  thyroid cancer, reoccured 2008  . Depression Sister   . Drug abuse Brother   . Stroke Maternal Grandmother   . Alzheimer's disease Maternal Grandmother   . Diabetes Maternal Grandmother   . Depression Sister    Allergies  Allergen Reactions  . Shellfish Allergy Swelling  . Gabapentin Other (See Comments)    Disoriented   Current Outpatient Medications on File Prior to Visit  Medication Sig Dispense Refill  . albuterol (PROVENTIL HFA;VENTOLIN HFA) 108 (90 Base) MCG/ACT inhaler Inhale 2 puffs into the lungs every 6 (six) hours as needed for wheezing or shortness of breath. 1 Inhaler 2  . amLODipine (NORVASC) 5 MG tablet Take 1 tablet (5 mg total) by mouth daily. 30 tablet 11  . aspirin 81 MG tablet Take 81 mg by mouth daily.      Marland Kitchen atorvastatin (LIPITOR) 20 MG tablet TAKE ONE TABLET BY MOUTH ONCE DAILY 90  tablet 0  . cetirizine (ZYRTEC) 10 MG tablet TAKE ONE TABLET BY MOUTH EVERY DAY 30 tablet 2  . fluticasone (FLONASE) 50 MCG/ACT nasal spray Place 2 sprays into both nostrils daily. 16 g 1  . glucose blood (ONE TOUCH ULTRA TEST) test strip USE ONE STRIP TO CHECK GLUCOSE THREE TIMES DAILY TO 4 TIMES DAILY AS NEEDED Dx. E11.9 100 each 5  . insulin aspart (NOVOLOG FLEXPEN) 100 UNIT/ML FlexPen 14 units actid 15 mL 1  . Insulin Degludec (TRESIBA FLEXTOUCH) 200 UNIT/ML SOPN Inject 60 Units into the skin daily. 3 pen 1  . lisinopril (PRINIVIL,ZESTRIL) 20 MG tablet TAKE ONE TABLET BY MOUTH ONCE DAILY 90 tablet 0  . metFORMIN (GLUCOPHAGE-XR) 500 MG 24 hr tablet Take 1,500 mg by mouth every evening.    Marland Kitchen omeprazole (PRILOSEC OTC) 20 MG tablet Take 20 mg by mouth daily.    . ondansetron (ZOFRAN ODT) 4 MG disintegrating tablet Take 1 tablet (4 mg total) by mouth every 8 (eight) hours as needed. 20 tablet 6  . PARoxetine (PAXIL) 20 MG tablet Take 1 tablet (20 mg total) by mouth daily. 30 tablet 11  . rizatriptan (MAXALT-MLT) 10 MG disintegrating tablet Take 1 tablet (10 mg total) by mouth as needed. May repeat in 2 hours if needed 12 tablet 6  . Semaglutide (OZEMPIC) 0.25 or 0.5 MG/DOSE SOPN Inject 0.5 mg into the skin once a week.     . [DISCONTINUED] ipratropium (ATROVENT) 0.03 % nasal spray Place 2 sprays into the nose 3 (three) times daily. 30 mL 2   Current Facility-Administered Medications on File Prior to Visit  Medication Dose Route Frequency Provider Last Rate Last Dose  . methylPREDNISolone acetate (DEPO-MEDROL) injection 80 mg  80 mg Intramuscular Once Jearld Fenton, NP        Review of Systems  Constitutional: Positive for fatigue. Negative for activity change, appetite change, fever and unexpected weight change.       Improved fatigue   HENT: Negative for congestion, ear pain, rhinorrhea, sinus pressure and sore throat.   Eyes: Negative for pain, redness and visual disturbance.  Respiratory:  Negative for cough, shortness of breath and wheezing.   Cardiovascular: Negative for chest pain and palpitations.  Gastrointestinal: Negative for abdominal pain, blood in stool, constipation and diarrhea.  Endocrine: Negative for polydipsia and polyuria.  Genitourinary: Negative for dysuria, frequency and urgency.  Musculoskeletal: Negative for arthralgias, back pain and myalgias.  Skin: Negative for pallor and rash.  Allergic/Immunologic: Negative for environmental allergies.  Neurological: Positive for headaches. Negative for dizziness and syncope.  Hematological: Negative for adenopathy. Does not bruise/bleed easily.  Psychiatric/Behavioral: Negative for decreased concentration and dysphoric mood. The patient is not nervous/anxious.        Objective:   Physical Exam  Constitutional: She appears well-developed and well-nourished. No distress.  obese and well appearing   HENT:  Head: Normocephalic and atraumatic.  Mouth/Throat: Oropharynx is clear and moist.  Eyes: Pupils are equal, round, and reactive to light. Conjunctivae and EOM are normal.  Neck: Normal range of motion. Neck supple. No JVD present. Carotid bruit is not present. No thyromegaly present.  Cardiovascular: Normal rate, regular rhythm, normal heart sounds and intact distal pulses. Exam reveals no gallop.  Pulmonary/Chest: Effort normal and breath sounds normal. No respiratory distress. She has no wheezes. She has no rales.  No crackles  Abdominal: Soft. Bowel sounds are normal. She exhibits no distension, no abdominal bruit and no mass. There is no tenderness.  Musculoskeletal: She exhibits no edema.  Lymphadenopathy:    She has no cervical adenopathy.  Neurological: She is alert. She has normal reflexes. She displays normal reflexes. No cranial nerve deficit. She exhibits normal muscle tone. Coordination normal.  Skin: Skin is warm and dry. No rash noted.  Psychiatric: She has a normal mood and affect.  Good mood    Not anxious Pos outlook today          Assessment & Plan:   Problem List Items Addressed This Visit      Cardiovascular and Mediastinum   Chronic migraine    Had visit with neuro/ Dr Krista Blue Headaches are much improved  Has zofran/triptan prn  With better bp control and less stress-improved  Enc good lifestyle habits       Essential hypertension - Primary    Much improved with addn of amlodipine 5 Continue lisinopril BP: 118/70  Also better lifestyle habits and improved migraines /less stress  Continue to follow        Endocrine   Diabetes mellitus type 2, uncontrolled ([employer])    Now seeing Dr Dwyane Dee Lab Results  Component Value Date   HGBA1C 9.4 01/25/2018   Improving with new med/ Tresiba instead of levemir        Other   Adjustment disorder with mixed anxiety and depressed mood    Improved mood with less stress / school is out  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

## 2018-03-09 NOTE — Assessment & Plan Note (Signed)
Now seeing Dr Dwyane Dee Lab Results  Component Value Date   HGBA1C 9.4 01/25/2018   Improving with new med/ Tresiba instead of levemir

## 2018-03-09 NOTE — Assessment & Plan Note (Signed)
Much improved with addn of amlodipine 5 Continue lisinopril BP: 118/70  Also better lifestyle habits and improved migraines /less stress  Continue to follow

## 2018-03-17 ENCOUNTER — Encounter: Payer: BLUE CROSS/BLUE SHIELD | Admitting: Nutrition

## 2018-04-12 ENCOUNTER — Encounter

## 2018-04-12 ENCOUNTER — Ambulatory Visit: Payer: Self-pay | Admitting: Neurology

## 2018-04-12 ENCOUNTER — Other Ambulatory Visit (INDEPENDENT_AMBULATORY_CARE_PROVIDER_SITE_OTHER): Payer: BLUE CROSS/BLUE SHIELD

## 2018-04-12 DIAGNOSIS — E1165 Type 2 diabetes mellitus with hyperglycemia: Secondary | ICD-10-CM | POA: Diagnosis not present

## 2018-04-12 DIAGNOSIS — Z794 Long term (current) use of insulin: Secondary | ICD-10-CM | POA: Diagnosis not present

## 2018-04-12 LAB — LIPID PANEL
Cholesterol: 166 mg/dL (ref 0–200)
HDL: 40 mg/dL (ref >39.00)
NONHDL: 125.71
TRIGLYCERIDES: 222 mg/dL — AB (ref 0.0–149.0)
Total CHOL/HDL Ratio: 4
VLDL: 44.4 mg/dL — ABNORMAL HIGH (ref 0.0–40.0)

## 2018-04-12 LAB — BASIC METABOLIC PANEL
BUN: 12 mg/dL (ref 6–23)
CALCIUM: 9.4 mg/dL (ref 8.4–10.5)
CHLORIDE: 101 meq/L (ref 96–112)
CO2: 29 mEq/L (ref 19–32)
CREATININE: 0.84 mg/dL (ref 0.40–1.20)
GFR: 75.03 mL/min (ref >60.00)
GLUCOSE: 343 mg/dL — AB (ref 70–99)
Potassium: 4.7 mEq/L (ref 3.5–5.1)
SODIUM: 137 meq/L (ref 135–145)

## 2018-04-12 LAB — MICROALBUMIN / CREATININE URINE RATIO
Creatinine,U: 37.5 mg/dL
MICROALB/CREAT RATIO: 1.9 mg/g (ref 0.0–30.0)

## 2018-04-12 LAB — LDL CHOLESTEROL, DIRECT: Direct LDL: 108 mg/dL

## 2018-04-13 LAB — FRUCTOSAMINE: Fructosamine: 279 umol/L (ref 0–285)

## 2018-04-13 NOTE — Progress Notes (Signed)
Patient ID: Stacy Monroe, female   DOB: November 10, 1965, 52 y.o.   MRN: [id number]          Reason for Appointment: Follow-up for Type 2 Diabetes  History of Present Illness:          Date of diagnosis of type 2 diabetes mellitus:   2009      Background history:  She initially was found to have prediabetes about 10 years ago Subsequently was started on metformin and also tried on Amaryl.  Although she had initial difficulty with GI disturbance with metformin and subsequently she was able to tolerate it at lower doses At some point she was also given Victoza In 2017 because of worsening blood sugar control she was started on Lantus in addition and this helped her control Her blood sugar control was fairly good until 9/18 when her blood sugar was higher and A1c 8.2 Subsequently was switched from Victoza to [place] in 08/2017  Recent history:   INSULIN regimen is:  TRESIBA 60  units at bedtime, NOVOLOG 8 units at breakfast and lunch and 8-12 at dinner     Non-insulin hypoglycemic drugs the patient is taking are: Metformin ER 1500 daily, not on Ozempic 1 mg weekly  Her last A1c was 9.4 Fructosamine is now 279  Current management, blood sugar patterns and problems identified:   She was started on NovoLog on her initial consultation because of postprandial hyperglycemia  Also Levemir was switched to [place], same doses once a day  She forgot to continue her Ozempic and has not taken it for over a month  Also was told to see the dietitian but has only seen her earlier this week as part of her husband's consultation  Overall her blood sugars are improved, previously averaging 219 with readings as high as 528  She has gained significant amount of weight, about 14 pounds since her initial consultation about 6 weeks ago  FASTING blood sugars are still overall mildly increased but has been as low as 108  However she cannot explain what her lab glucose fasting was over 300 and has  occasional readings in the mornings as high as 266  She will sometimes take her NOVOLOG after eating lunch and not before and has only 3 readings at dinnertime which are on the high  Sugars after evening meal have been checked only randomly again but mostly high readings, no readings 3 weeks as well  She does not think she can afford the freestyle libre  Despite reminders she has not done any exercise and does not appear motivated to do so  She does adjust her NovoLog based on what she is eating and one night even with reducing her suppertime dose to 8 units and very little carbohydrates she had a relatively low sugar of 70 overnight        Side effects from medications have been: None  Compliance with the medical regimen: Variable  Hypoglycemia:   None  Glucose monitoring:  done 3-4 times a day         Glucometer: One Touch.      Blood Glucose readings by time of day and averages from meter download:   FASTING blood sugar range 108-266 with average about 152 after breakfast average 114 Lowest blood sugar was 70 and at about 2 AM, overall median 154  Self-care: The diet that the patient has been following is: tries to limit fried food and regular soft drinks.  Also avoiding any diet sodas  Typical meal intake: Breakfast is breakfast sandwich or eggs and toast              Dietician visit, most recent: Years ago               Exercise:  None  Weight history: Maximum about 225  Wt Readings from Last 3 Encounters:  04/14/18 213 lb (96.6 kg)  03/09/18 206 lb 8 oz (93.7 kg)  03/01/18 199 lb 3.2 oz (90.4 kg)    Glycemic control:   Lab Results  Component Value Date   HGBA1C 9.4 01/25/2018   HGBA1C 9.2 (H) 07/09/2015   HGBA1C 7.5 (H) 04/10/2015   Lab Results  Component Value Date   MICROALBUR <0.7 04/12/2018   LDLCALC 63 04/10/2015   CREATININE 0.84 04/12/2018   Lab Results  Component Value Date   MICRALBCREAT 1.9 04/12/2018    Lab Results  Component Value  Date   FRUCTOSAMINE 279 04/12/2018      Allergies as of 04/14/2018      Reactions   Shellfish Allergy Swelling   Gabapentin Other (See Comments)   Disoriented      Medication List        Accurate as of 04/14/18 12:49 PM. Always use your most recent med list.          albuterol 108 (90 Base) MCG/ACT inhaler Commonly known as:  PROVENTIL HFA;VENTOLIN HFA Inhale 2 puffs into the lungs every 6 (six) hours as needed for wheezing or shortness of breath.   amLODipine 5 MG tablet Commonly known as:  NORVASC Take 1 tablet (5 mg total) by mouth daily.   aspirin 81 MG tablet Take 81 mg by mouth daily.   atorvastatin 20 MG tablet Commonly known as:  LIPITOR TAKE ONE TABLET BY MOUTH ONCE DAILY   cetirizine 10 MG tablet Commonly known as:  ZYRTEC TAKE ONE TABLET BY MOUTH EVERY DAY   fluticasone 50 MCG/ACT nasal spray Commonly known as:  FLONASE Place 2 sprays into both nostrils daily.   glucose blood test strip Commonly known as:  ONE TOUCH ULTRA TEST USE ONE STRIP TO CHECK GLUCOSE THREE TIMES DAILY TO 4 TIMES DAILY AS NEEDED Dx. E11.9   hydrochlorothiazide 12.5 MG capsule Commonly known as:  MICROZIDE Take 1 capsule (12.5 mg total) by mouth daily.   insulin aspart 100 UNIT/ML FlexPen Commonly known as:  NOVOLOG FLEXPEN 14 units actid   Insulin Degludec 200 UNIT/ML Sopn Commonly known as:  TRESIBA FLEXTOUCH Inject 60 Units into the skin daily.   lisinopril 20 MG tablet Commonly known as:  PRINIVIL,ZESTRIL TAKE ONE TABLET BY MOUTH ONCE DAILY   metFORMIN 500 MG 24 hr tablet Commonly known as:  GLUCOPHAGE-XR Take 1,500 mg by mouth every evening.   omeprazole 20 MG tablet Commonly known as:  PRILOSEC OTC Take 20 mg by mouth daily.   ondansetron 4 MG disintegrating tablet Commonly known as:  ZOFRAN ODT Take 1 tablet (4 mg total) by mouth every 8 (eight) hours as needed.   OZEMPIC 0.25 or 0.5 MG/DOSE Sopn Generic drug:  Semaglutide Inject 0.5 mg into the skin  once a week.   PARoxetine 20 MG tablet Commonly known as:  PAXIL Take 1 tablet (20 mg total) by mouth daily.   rizatriptan 10 MG disintegrating tablet Commonly known as:  MAXALT-MLT Take 1 tablet (10 mg total) by mouth as needed. May repeat in 2 hours if needed       Allergies:  Allergies  Allergen Reactions  .  Shellfish Allergy Swelling  . Gabapentin Other (See Comments)    Disoriented    Past Medical History:  Diagnosis Date  . Diabetes mellitus    type II controlled  . Hyperlipidemia   . Hypertension   . Migraine    common  . Obesity   . Palpitations   . Plantar fasciitis   . S/P tonsillectomy   . Vertigo     Past Surgical History:  Procedure Laterality Date  . BLADDER SURGERY  1990   bladder tack after 2nd delivery    Family History  Problem Relation Age of Onset  . Arthritis Mother        RA  . Hypertension Mother   . Drug abuse Mother   . Depression Mother   . Depression Sister   . Diabetes Sister   . Cancer Sister        thyroid cancer, reoccured 2008  . Depression Sister   . Drug abuse Brother   . Stroke Maternal Grandmother   . Alzheimer's disease Maternal Grandmother   . Diabetes Maternal Grandmother   . Depression Sister     Social History:  reports that she has never smoked. She has never used smokeless tobacco. She reports that she does not drink alcohol or use drugs.    REVIEW of Systems  Lipid history: On treatment since about 2009 Fasting lipid still not adequate on her regimen of atorvastatin prescribed by PCP     Lab Results  Component Value Date   CHOL 166 04/12/2018   HDL 40.00 04/12/2018   LDLCALC 63 04/10/2015   LDLDIRECT 108.0 04/12/2018   TRIG 222.0 (H) 04/12/2018   CHOLHDL 4 04/12/2018           Hypertension: Has been present for several years requiring multiple drugs   Blood pressure is unusually high  Also she is having some EDEMA of her legs especially in the evenings, currently not on diuretics  BP  Readings from Last 3 Encounters:  04/14/18 122/88  03/09/18 118/70  03/01/18 112/70    Most recent eye exam was in 2017  Most recent foot exam: 02/2018  She has a history of occasional vaginal yeast infections    LABS:  Lab on 04/12/2018  Component Date Value Ref Range Status  . Microalb, Ur 04/12/2018 <0.7  0.0 - 1.9 mg/dL Final  . Creatinine,U 04/12/2018 37.5  mg/dL Final  . Microalb Creat Ratio 04/12/2018 1.9  0.0 - 30.0 mg/g Final  . Cholesterol 04/12/2018 166  0 - 200 mg/dL Final   ATP III Classification       Desirable:  < 200 mg/dL               Borderline High:  200 - 239 mg/dL          High:  > = 240 mg/dL  . Triglycerides 04/12/2018 222.0* 0.0 - 149.0 mg/dL Final   Normal:  <150 mg/dLBorderline High:  150 - 199 mg/dL  . HDL 04/12/2018 40.00  >39.00 mg/dL Final  . VLDL 04/12/2018 44.4* 0.0 - 40.0 mg/dL Final  . Total CHOL/HDL Ratio 04/12/2018 4   Final                  Men          Women1/2 Average Risk     3.4          3.3Average Risk          5.0  4.42X Average Risk          9.6          7.13X Average Risk          15.0          11.0                      . NonHDL 04/12/2018 125.71   Final   NOTE:  Non-HDL goal should be 30 mg/dL higher than patient's LDL goal (i.e. LDL goal of < 70 mg/dL, would have non-HDL goal of < 100 mg/dL)  . Fructosamine 04/12/2018 279  0 - 285 umol/L Final   Comment: Published reference interval for apparently healthy subjects between age 59 and 75 is 83 - 285 umol/L and in a poorly controlled diabetic population is 228 - 563 umol/L with a mean of 396 umol/L.   Marland Kitchen Sodium 04/12/2018 137  135 - 145 mEq/L Final  . Potassium 04/12/2018 4.7  3.5 - 5.1 mEq/L Final  . Chloride 04/12/2018 101  96 - 112 mEq/L Final  . CO2 04/12/2018 29  19 - 32 mEq/L Final  . Glucose, Bld 04/12/2018 343* 70 - 99 mg/dL Final  . BUN 04/12/2018 12  6 - 23 mg/dL Final  . Creatinine, Ser 04/12/2018 0.84  0.40 - 1.20 mg/dL Final  . Calcium 04/12/2018 9.4  8.4 -  10.5 mg/dL Final  . GFR 04/12/2018 75.03  >60.00 mL/min Final  . Direct LDL 04/12/2018 108.0  mg/dL Final   Optimal:  <100 mg/dLNear or Above Optimal:  100-129 mg/dLBorderline High:  130-159 mg/dLHigh:  160-189 mg/dLVery High:  >190 mg/dL    Physical Examination:  BP 122/88   Pulse 69   Ht 5\' 4"  (1.626 m)   Wt 213 lb (96.6 kg)   SpO2 97%   BMI 36.56 kg/m   1+ pedal edema Repeat blood pressure was 142/86    ASSESSMENT:  Diabetes type 2, uncontrolled with last A1c 9.4  See history of present illness for detailed discussion of current diabetes management, blood sugar patterns and problems identified  Although she is requiring basal bolus insulin regimen her blood sugars are still not optimal and quite variable However her fructosamine is supper normal now  She has not done enough readings after meals to help adjust her mealtime doses She can do much better with diet and exercise especially with her recent significant weight gain Discussed that Ozempic will be helpful in modulating her insulin action, reducing need for mealtime insulin, helping with weight loss and she does need to take this consistently along with insulin especially to prevent further weight gain She will not be a good candidate for [place] since she has had recurrent yeast infections but may be a consideration  HYPERLIPIDEMIA: Improving but still not at goal, will defer to PCP again  HYPERTENSION: Managed by PCP and appears well controlled until today and may be having higher readings from sodium and water retention using higher doses of insulin and much lower blood sugar readings  Microalbumin is negative  PLAN:    RESTART Ozempic.  She can take 0.5 mg of the first dose and then 1 mg weekly as before  She needs to monitor blood sugars much more consistently after meals and may not need to check every morning  Discussed blood sugar targets  She will need to continue to adjust her mealtime dose based on  portion size and carbohydrates  She will improve her diet as  recommended by dietitian  To make sure she takes her NovoLog before eating and not after  No change in [place] as yet  Start walking or other exercise, she can accompanying her husband  HYDROCHLOROTHIAZIDE 12.5 mg daily will be started for her increasing blood pressure and edema, she will continue her amlodipine and lisinopril  Patient Instructions  Check blood sugars on waking up  3-4/7 days  Also check blood sugars about 2 hours after a meal and do this after different meals by rotation  Recommended blood sugar levels on waking up is 90-130 and about 2 hours after meal is 130-160  Please bring your blood sugar monitor to each visit, thank you  Start at least 10 min walking daily  Take Novolog before meals     Counseling time on subjects discussed in assessment and plan sections is over 50% of today's 25 minute visit    Elayne Snare 04/14/2018, 12:49 PM   Note: This office note was prepared with Dragon voice recognition system technology. Any transcriptional errors that result from this process are unintentional.

## 2018-04-14 ENCOUNTER — Ambulatory Visit: Payer: BLUE CROSS/BLUE SHIELD | Admitting: Endocrinology

## 2018-04-14 ENCOUNTER — Encounter: Payer: Self-pay | Admitting: Endocrinology

## 2018-04-14 VITALS — BP 122/88 | HR 69 | Ht 64.0 in | Wt 213.0 lb

## 2018-04-14 DIAGNOSIS — Z794 Long term (current) use of insulin: Secondary | ICD-10-CM

## 2018-04-14 DIAGNOSIS — I10 Essential (primary) hypertension: Secondary | ICD-10-CM | POA: Diagnosis not present

## 2018-04-14 DIAGNOSIS — E1165 Type 2 diabetes mellitus with hyperglycemia: Secondary | ICD-10-CM

## 2018-04-14 DIAGNOSIS — R600 Localized edema: Secondary | ICD-10-CM | POA: Diagnosis not present

## 2018-04-14 DIAGNOSIS — E782 Mixed hyperlipidemia: Secondary | ICD-10-CM | POA: Diagnosis not present

## 2018-04-14 MED ORDER — HYDROCHLOROTHIAZIDE 12.5 MG PO CAPS: 12.5000 mg | ORAL_CAPSULE | Freq: Every day | ORAL | 2 refills | Status: DC

## 2018-04-14 NOTE — Patient Instructions (Addendum)
Check blood sugars on waking up  3-4/7 days  Also check blood sugars about 2 hours after a meal and do this after different meals by rotation  Recommended blood sugar levels on waking up is 90-130 and about 2 hours after meal is 130-160  Please bring your blood sugar monitor to each visit, thank you  Start at least 10 min walking daily  Take Novolog before meals

## 2018-04-21 ENCOUNTER — Ambulatory Visit: Payer: BLUE CROSS/BLUE SHIELD | Admitting: Neurology

## 2018-05-01 ENCOUNTER — Other Ambulatory Visit: Payer: Self-pay | Admitting: Endocrinology

## 2018-06-01 ENCOUNTER — Ambulatory Visit: Payer: BLUE CROSS/BLUE SHIELD

## 2018-06-02 ENCOUNTER — Ambulatory Visit: Payer: Self-pay

## 2018-06-08 ENCOUNTER — Other Ambulatory Visit (INDEPENDENT_AMBULATORY_CARE_PROVIDER_SITE_OTHER): Payer: BLUE CROSS/BLUE SHIELD

## 2018-06-08 DIAGNOSIS — E1165 Type 2 diabetes mellitus with hyperglycemia: Secondary | ICD-10-CM | POA: Diagnosis not present

## 2018-06-08 DIAGNOSIS — Z794 Long term (current) use of insulin: Secondary | ICD-10-CM | POA: Diagnosis not present

## 2018-06-08 LAB — BASIC METABOLIC PANEL
BUN: 11 mg/dL (ref 6–23)
CHLORIDE: 101 meq/L (ref 96–112)
CO2: 30 meq/L (ref 19–32)
CREATININE: 0.73 mg/dL (ref 0.40–1.20)
Calcium: 9.3 mg/dL (ref 8.4–10.5)
GFR: 88.17 mL/min (ref >60.00)
Glucose, Bld: 123 mg/dL — ABNORMAL HIGH (ref 70–99)
POTASSIUM: 4.2 meq/L (ref 3.5–5.1)
Sodium: 139 mEq/L (ref 135–145)

## 2018-06-08 LAB — HEMOGLOBIN A1C: Hgb A1c MFr Bld: 7.2 % — ABNORMAL HIGH (ref 4.6–6.5)

## 2018-06-14 ENCOUNTER — Encounter: Payer: Self-pay | Admitting: Endocrinology

## 2018-06-14 ENCOUNTER — Ambulatory Visit: Payer: BLUE CROSS/BLUE SHIELD | Admitting: Endocrinology

## 2018-06-14 VITALS — BP 130/78 | HR 98 | Ht 64.0 in | Wt 218.0 lb

## 2018-06-14 DIAGNOSIS — I10 Essential (primary) hypertension: Secondary | ICD-10-CM

## 2018-06-14 DIAGNOSIS — E1165 Type 2 diabetes mellitus with hyperglycemia: Secondary | ICD-10-CM | POA: Diagnosis not present

## 2018-06-14 DIAGNOSIS — Z794 Long term (current) use of insulin: Secondary | ICD-10-CM | POA: Diagnosis not present

## 2018-06-14 MED ORDER — HYDROCHLOROTHIAZIDE 12.5 MG PO CAPS: 12.5000 mg | ORAL_CAPSULE | Freq: Every day | ORAL | 2 refills | Status: DC

## 2018-06-14 MED ORDER — FREESTYLE LIBRE 14 DAY READER DEVI: 1.0000 | Freq: Once | 0 refills | Status: AC

## 2018-06-14 MED ORDER — INSULIN DEGLUDEC 200 UNIT/ML ~~LOC~~ SOPN: 60.0000 [IU] | PEN_INJECTOR | Freq: Once | SUBCUTANEOUS | 6 refills | Status: AC

## 2018-06-14 MED ORDER — METFORMIN HCL ER 500 MG PO TB24: 1500.0000 mg | ORAL_TABLET | Freq: Every evening | ORAL | 90 refills | Status: AC

## 2018-06-14 MED ORDER — FREESTYLE LIBRE 14 DAY SENSOR MISC: 1.0000 [IU] | 4 refills | Status: DC

## 2018-06-14 MED ORDER — INSULIN ASPART 100 UNIT/ML FLEXPEN: PEN_INJECTOR | SUBCUTANEOUS | 3 refills | Status: AC

## 2018-06-14 NOTE — Progress Notes (Signed)
Patient ID: Stacy Monroe, female   DOB: 1966/03/02, 52 y.o.   MRN: [id number]          Reason for Appointment: Follow-up for Type 2 Diabetes  History of Present Illness:          Date of diagnosis of type 2 diabetes mellitus:   2009      Background history:  She initially was found to have prediabetes about 10 years ago Subsequently was started on metformin and also tried on Amaryl.  Although she had initial difficulty with GI disturbance with metformin and subsequently she was able to tolerate it at lower doses At some point she was also given Victoza In 2017 because of worsening blood sugar control she was started on Lantus in addition and this helped her control Her blood sugar control was fairly good until 9/18 when her blood sugar was higher and A1c 8.2 Subsequently was switched from Victoza to [place] in 08/2017  Recent history:   INSULIN regimen is:  TRESIBA 60  units at bedtime, NOVOLOG 8 units at breakfast and lunch and 10-12 at dinner     Non-insulin hypoglycemic drugs the patient is taking are: Metformin ER 1500 daily, on Ozempic 0.5 mg weekly  Her last A1c was 9.4 and this is now 7.2   Current management, blood sugar patterns and problems identified:   She was started on Ozempic again when she was seen in July and is taking 0.5 mg weekly  On her last visit her morning sugars are fluctuating very significantly but they are much more stable now  Also she is trying to take her [place] consistently; with this her FASTING blood sugars are averaging below 125  She says that she takes her NOVOLOG after lunch because she is going out to eat and will take it when she goes back to her office  Has only one reading after lunch however  She does adjust her NovoLog enough based on what she is eating since most of her readings after evening meal are significantly high except once when she had a low carbohydrate meal; on that day with 8 units and very little carbohydrate and a  salad she had a relatively low sugar of 63 overnight  She is not having high readings after breakfast/before lunch  She is not motivated to do any formal exercise  Her weight has continued to go up        Side effects from medications have been: None  Compliance with the medical regimen: Variable  Hypoglycemia:   None  Glucose monitoring:  done 3-4 times a day         Glucometer: One Touch.      Blood Glucose readings by time of day and averages from meter download:   PRE-MEAL Fasting Lunch Dinner Bedtime Overall  Glucose range:  75-170      Mean/median:  123  93   123   POST-MEAL PC Breakfast PC Lunch PC Dinner  Glucose range:   203  90-306  Mean/median:    249     Self-care: The diet that the patient has been following is: tries to limit fried food and regular soft drinks.  Also avoiding any diet sodas    Typical meal intake: Breakfast is breakfast sandwich or eggs and toast               Dietician visit, most recent: Years ago  Exercise:  Some walking while at work, no formal exercise  Weight history: Maximum about 225  Wt Readings from Last 3 Encounters:  06/14/18 218 lb (98.9 kg)  04/14/18 213 lb (96.6 kg)  03/09/18 206 lb 8 oz (93.7 kg)    Glycemic control:   Lab Results  Component Value Date   HGBA1C 7.2 (H) 06/08/2018   HGBA1C 9.4 01/25/2018   HGBA1C 9.2 (H) 07/09/2015   Lab Results  Component Value Date   MICROALBUR <0.7 04/12/2018   [place] 63 04/10/2015   CREATININE 0.73 06/08/2018   Lab Results  Component Value Date   MICRALBCREAT 1.9 04/12/2018    Lab Results  Component Value Date   FRUCTOSAMINE 279 04/12/2018      Allergies as of 06/14/2018      Reactions   Shellfish Allergy Swelling   Gabapentin Other (See Comments)   Disoriented      Medication List        Accurate as of 06/14/18  2:16 PM. Always use your most recent med list.          albuterol 108 (90 Base) MCG/ACT inhaler Commonly known as:   PROVENTIL HFA;VENTOLIN HFA Inhale 2 puffs into the lungs every 6 (six) hours as needed for wheezing or shortness of breath.   amLODipine 5 MG tablet Commonly known as:  NORVASC Take 1 tablet (5 mg total) by mouth daily.   aspirin 81 MG tablet Take 81 mg by mouth daily.   atorvastatin 20 MG tablet Commonly known as:  LIPITOR TAKE ONE TABLET BY MOUTH ONCE DAILY   cetirizine 10 MG tablet Commonly known as:  ZYRTEC TAKE ONE TABLET BY MOUTH EVERY DAY   fluticasone 50 MCG/ACT nasal spray Commonly known as:  FLONASE Place 2 sprays into both nostrils daily.   FREESTYLE LIBRE 14 DAY READER Devi 1 Device by Does not apply route once for 1 dose.   FREESTYLE LIBRE 14 DAY SENSOR Misc 1 Units by Does not apply route every 14 (fourteen) days.   glucose blood test strip USE ONE STRIP TO CHECK GLUCOSE THREE TIMES DAILY TO 4 TIMES DAILY AS NEEDED Dx. E11.9   hydrochlorothiazide 12.5 MG capsule Commonly known as:  MICROZIDE Take 1 capsule (12.5 mg total) by mouth daily.   insulin aspart 100 UNIT/ML FlexPen Commonly known as:  NOVOLOG 14 units actid   Insulin Degludec 200 UNIT/ML Sopn Inject 60 Units into the skin once for 1 dose.   lisinopril 20 MG tablet Commonly known as:  PRINIVIL,ZESTRIL TAKE ONE TABLET BY MOUTH ONCE DAILY   metFORMIN 500 MG 24 hr tablet Commonly known as:  GLUCOPHAGE-XR Take 3 tablets (1,500 mg total) by mouth every evening.   omeprazole 20 MG tablet Commonly known as:  PRILOSEC OTC Take 20 mg by mouth daily.   ondansetron 4 MG disintegrating tablet Commonly known as:  ZOFRAN-ODT Take 1 tablet (4 mg total) by mouth every 8 (eight) hours as needed.   OZEMPIC (0.25 OR 0.5 MG/DOSE) 2 MG/1.5ML Sopn Generic drug:  Semaglutide(0.25 or 0.5MG /DOS) Inject 0.5 mg into the skin once a week.   PARoxetine 20 MG tablet Commonly known as:  PAXIL Take 1 tablet (20 mg total) by mouth daily.   rizatriptan 10 MG disintegrating tablet Commonly known as:   MAXALT-MLT Take 1 tablet (10 mg total) by mouth as needed. May repeat in 2 hours if needed       Allergies:  Allergies  Allergen Reactions  . Shellfish Allergy Swelling  .  Gabapentin Other (See Comments)    Disoriented    Past Medical History:  Diagnosis Date  . Diabetes mellitus    type II controlled  . Hyperlipidemia   . Hypertension   . Migraine    common  . Obesity   . Palpitations   . Plantar fasciitis   . S/P tonsillectomy   . Vertigo     Past Surgical History:  Procedure Laterality Date  . BLADDER SURGERY  1990   bladder tack after 2nd delivery    Family History  Problem Relation Age of Onset  . Arthritis Mother        RA  . Hypertension Mother   . Drug abuse Mother   . Depression Mother   . Depression Sister   . Diabetes Sister   . Cancer Sister        thyroid cancer, reoccured 2008  . Depression Sister   . Drug abuse Brother   . Stroke Maternal Grandmother   . Alzheimer's disease Maternal Grandmother   . Diabetes Maternal Grandmother   . Depression Sister     Social History:  reports that she has never smoked. She has never used smokeless tobacco. She reports that she does not drink alcohol or use drugs.    REVIEW of Systems  Lipid history: On treatment since about 2009 Fasting lipid still not adequate on her regimen of atorvastatin prescribed by PCP     Lab Results  Component Value Date   CHOL 166 04/12/2018   HDL 40.00 04/12/2018   LDLCALC 63 04/10/2015   LDLDIRECT 108.0 04/12/2018   TRIG 222.0 (H) 04/12/2018   CHOLHDL 4 04/12/2018           Hypertension: Has been present for several years requiring multiple drugs   Blood pressure is 16X diastolic at home now with adding HCTZ  EDEMA of her legs is better with starting HCTZ    BP Readings from Last 3 Encounters:  06/14/18 130/78  04/14/18 122/88  03/09/18 118/70     Most recent foot exam: 02/2018  She has a history of occasional vaginal yeast  infections    LABS:  Lab on 06/08/2018  Component Date Value Ref Range Status  . Sodium 06/08/2018 139  135 - 145 mEq/L Final  . Potassium 06/08/2018 4.2  3.5 - 5.1 mEq/L Final  . Chloride 06/08/2018 101  96 - 112 mEq/L Final  . CO2 06/08/2018 30  19 - 32 mEq/L Final  . Glucose, Bld 06/08/2018 123* 70 - 99 mg/dL Final  . BUN 06/08/2018 11  6 - 23 mg/dL Final  . Creatinine, Ser 06/08/2018 0.73  0.40 - 1.20 mg/dL Final  . Calcium 06/08/2018 9.3  8.4 - 10.5 mg/dL Final  . GFR 06/08/2018 88.17  >60.00 mL/min Final  . Hgb A1c MFr Bld 06/08/2018 7.2* 4.6 - 6.5 % Final   Glycemic Control Guidelines for People with Diabetes:Non Diabetic:  <6%Goal of Therapy: <7%Additional Action Suggested:  >8%     Physical Examination:  BP 130/78 (Cuff Size: Large)   Pulse 98   Ht 5\' 4"  (1.626 m)   Wt 218 lb (98.9 kg)   SpO2 98%   BMI 37.42 kg/m   No edema    ASSESSMENT:  Diabetes type 2, insulin-dependent  See history of present illness for detailed discussion of current diabetes management, blood sugar patterns and problems identified  Her A1c is much better at 7.2 compared to the last few readings  She is likely benefiting from  adding Ozempic with reduced variability in fasting blood sugars and overall better control However since she is not consistent with her diet her blood sugars appear to be mostly high in the evenings, most of these readings are down on the weekends when she may be eating out or eating sweets Also not exercising She is taking her lunchtime insulin postprandially which she was told will not be adequate to control her postprandial readings Her blood sugar patterns on the monitor for download and reviewed  HYPERTENSION: Better controlled with adding HCTZ and has less edema also No hypokalemia with this   PLAN:     She will need to increase her NovoLog up to 18 units when eating significant amount of carbohydrates at dinnertime  Also will need to start checking  her blood sugar much more often to help adjust her insulin and also to help with compliance with her diet  For this reason she is a good candidate for the freestyle libre system and explained how this would be used but she is concerned about the cost  Recommended that she at least use the freestyle libre 2 weeks out of the month to get more information to help her get better control and give [place] better idea of her blood sugar patterns  When she is going out to eat she will need to take her insulin with her or take it 10 minutes before her planned meal and discussed action profile of NovoLog insulin  Regular walking for exercise  No change in basal insulin or Ozempic today  However may consider 1 mg Ozempic on the next visit  Recommended cutting back on caloric intake and carbohydrates because of her continued weight gain  Patient Instructions  Check blood sugars on waking up  4/7  Also check blood sugars about 2 hours after a meal and do this after different meals by rotation  Recommended blood sugar levels on waking up is 90-130 and about 2 hours after meal is 130-160  Please bring your blood sugar monitor to each visit, thank you  Exercise daily  Upto 18 novolog  Inject before meals  Counseling time on subjects discussed in assessment and plan sections is over 50% of today's 25 minute visit       Elayne Snare 06/14/2018, 2:16 PM   Note: This office note was prepared with Dragon voice recognition system technology. Any transcriptional errors that result from this process are unintentional.

## 2018-06-14 NOTE — Patient Instructions (Addendum)
Check blood sugars on waking up  4/7  Also check blood sugars about 2 hours after a meal and do this after different meals by rotation  Recommended blood sugar levels on waking up is 90-130 and about 2 hours after meal is 130-160  Please bring your blood sugar monitor to each visit, thank you  Exercise daily  Upto 18 novolog  Inject before meals

## 2018-07-05 ENCOUNTER — Other Ambulatory Visit: Payer: Self-pay | Admitting: Family Medicine

## 2018-07-13 ENCOUNTER — Other Ambulatory Visit: Payer: Self-pay | Admitting: Endocrinology

## 2018-09-13 ENCOUNTER — Ambulatory Visit: Payer: BLUE CROSS/BLUE SHIELD | Admitting: Endocrinology

## 2018-09-13 ENCOUNTER — Other Ambulatory Visit: Payer: BLUE CROSS/BLUE SHIELD

## 2018-09-17 ENCOUNTER — Other Ambulatory Visit (INDEPENDENT_AMBULATORY_CARE_PROVIDER_SITE_OTHER): Payer: Self-pay

## 2018-09-17 DIAGNOSIS — Z794 Long term (current) use of insulin: Secondary | ICD-10-CM

## 2018-09-17 DIAGNOSIS — E1165 Type 2 diabetes mellitus with hyperglycemia: Secondary | ICD-10-CM

## 2018-09-17 LAB — HEMOGLOBIN A1C: HEMOGLOBIN A1C: 6.5 % (ref 4.6–6.5)

## 2018-09-17 LAB — BASIC METABOLIC PANEL
BUN: 13 mg/dL (ref 6–23)
CALCIUM: 8.8 mg/dL (ref 8.4–10.5)
CO2: 30 mEq/L (ref 19–32)
CREATININE: 0.78 mg/dL (ref 0.40–1.20)
Chloride: 100 mEq/L (ref 96–112)
GFR: 81.59 mL/min (ref >60.00)
Glucose, Bld: 233 mg/dL — ABNORMAL HIGH (ref 70–99)
Potassium: 4 mEq/L (ref 3.5–5.1)
Sodium: 137 mEq/L (ref 135–145)

## 2018-09-19 NOTE — Progress Notes (Signed)
Patient ID: Stacy Monroe, female   DOB: 1965-12-14, 52 y.o.   MRN: [id number]          Reason for Appointment: Follow-up for Type 2 Diabetes  History of Present Illness:          Date of diagnosis of type 2 diabetes mellitus:   2009      Background history:  She initially was found to have prediabetes about 10 years ago Subsequently was started on metformin and also tried on Amaryl.  Although she had initial difficulty with GI disturbance with metformin and subsequently she was able to tolerate it at lower doses At some point she was also given Victoza In 2017 because of worsening blood sugar control she was started on Lantus in addition and this helped her control Her blood sugar control was fairly good until 9/18 when her blood sugar was higher and A1c 8.2 Subsequently was switched from Victoza to [place] in 08/2017  Recent history:   INSULIN regimen is:  TRESIBA 60  units at bedtime, NOVOLOG 0-8 units at meals   Non-insulin hypoglycemic drugs the patient is taking are: Metformin ER 1500 daily, on Ozempic 1 mg weekly  Her A1c has improved further at 6.5   Current management, blood sugar patterns and problems identified:   She was started on Ozempic in July and although she has been prescribed 0.5 mg she thinks that she has been getting the prescription for 1 mg that was done by her previous physician, not shown on her records  She is now using the freestyle libre system  Although she is finding the freestyle libre more convenient and is willing to pay more for it it appears to be reading falsely low overall  On her lab glucose her blood sugar was 233 but did not have a tracing on her CGM at the same time  Also her blood sugar at home is averaging only 119 and lower than expected for her A1c  She is getting HYPOGLYCEMIA as seen on her freestyle libre frequently overnight and she does not have any symptoms unless blood sugars are below about 50-60  Also she has not reduced  her TRESIBA even though she is having low normal or low sugars overnight and appears to have 14% of her readings below 70  POSTPRANDIAL readings are frequently higher after lunch and dinner  She says that she is sometimes taking NovoLog only when blood sugar was up after eating and otherwise may take it only when eating high carbohydrate foods like spaghetti only  However she does appear to have fairly regular increase in her blood sugar after either lunch or dinner but not breakfast  She has again gained 3 pounds  Still not doing much exercise        Side effects from medications have been: None  Compliance with the medical regimen: Variable  Glucose monitoring:  done 3-4 times a day         Glucometer: Freestyle libre      Blood Glucose readings by time of day and averages from meter download:  CGM use % of time   Average and SD 119  Time in range        %  % Time Above 180  10  % Time above 250  76  % Time Below target  14      PRE-MEAL Fasting Lunch Dinner Bedtime Overall  Glucose range:       Mean/median:  94  106  147     POST-MEAL PC Breakfast PC Lunch PC Dinner  Glucose range:     Mean/median:  113  138  161   PREVIOUS readings:  PRE-MEAL Fasting Lunch Dinner Bedtime Overall  Glucose range:  75-170      Mean/median:  123  93   123   POST-MEAL PC Breakfast PC Lunch PC Dinner  Glucose range:   203  90-306  Mean/median:    249     Self-care: The diet that the patient has been following is: tries to limit fried food and regular soft drinks.  Also avoiding any diet sodas    Typical meal intake: Breakfast is breakfast sandwich or eggs and toast               Dietician visit, most recent: Years ago               Exercise:  Some walking while at work, no formal exercise  Weight history: Maximum about 225  Wt Readings from Last 3 Encounters:  09/20/18 221 lb (100.2 kg)  06/14/18 218 lb (98.9 kg)  04/14/18 213 lb (96.6 kg)    Glycemic control:   Lab  Results  Component Value Date   HGBA1C 6.5 09/17/2018   HGBA1C 7.2 (H) 06/08/2018   HGBA1C 9.4 01/25/2018   Lab Results  Component Value Date   MICROALBUR <0.7 04/12/2018   LDLCALC 63 04/10/2015   CREATININE 0.78 09/17/2018   Lab Results  Component Value Date   MICRALBCREAT 1.9 04/12/2018    Lab Results  Component Value Date   FRUCTOSAMINE 279 04/12/2018      Allergies as of 09/20/2018      Reactions   Shellfish Allergy Swelling   Gabapentin Other (See Comments)   Disoriented      Medication List       Accurate as of September 20, 2018  4:15 PM. Always use your most recent med list.        albuterol 108 (90 Base) MCG/ACT inhaler Commonly known as:  PROVENTIL HFA;VENTOLIN HFA Inhale 2 puffs into the lungs every 6 (six) hours as needed for wheezing or shortness of breath.   amLODipine 5 MG tablet Commonly known as:  NORVASC Take 1 tablet (5 mg total) by mouth daily.   aspirin 81 MG tablet Take 81 mg by mouth daily.   atorvastatin 20 MG tablet Commonly known as:  LIPITOR TAKE ONE TABLET BY MOUTH ONCE DAILY   cetirizine 10 MG tablet Commonly known as:  ZYRTEC TAKE ONE TABLET BY MOUTH EVERY DAY   fluticasone 50 MCG/ACT nasal spray Commonly known as:  FLONASE Place 2 sprays into both nostrils daily.   FREESTYLE LIBRE 14 DAY SENSOR Misc 1 Units by Does not apply route every 14 (fourteen) days.   glucose blood test strip Commonly known as:  ONE TOUCH ULTRA TEST USE ONE STRIP TO CHECK GLUCOSE THREE TIMES DAILY TO 4 TIMES DAILY AS NEEDED Dx. E11.9   hydrochlorothiazide 12.5 MG capsule Commonly known as:  MICROZIDE Take 1 capsule (12.5 mg total) by mouth daily.   insulin aspart 100 UNIT/ML FlexPen Commonly known as:  NOVOLOG FLEXPEN 14 units actid   lisinopril 20 MG tablet Commonly known as:  PRINIVIL,ZESTRIL TAKE ONE TABLET BY MOUTH ONCE DAILY   metFORMIN 500 MG 24 hr tablet Commonly known as:  GLUCOPHAGE-XR Take 3 tablets (1,500 mg total) by  mouth every evening.   omeprazole 20 MG tablet Commonly known as:  PRILOSEC OTC Take  20 mg by mouth daily.   ondansetron 4 MG disintegrating tablet Commonly known as:  ZOFRAN ODT Take 1 tablet (4 mg total) by mouth every 8 (eight) hours as needed.   OZEMPIC (0.25 OR 0.5 MG/DOSE) 2 MG/1.5ML Sopn Generic drug:  Semaglutide(0.25 or 0.5MG /DOS) Inject 0.5 mg into the skin once a week.   PARoxetine 20 MG tablet Commonly known as:  PAXIL TAKE 1 TABLET BY MOUTH ONCE DAILY   rizatriptan 10 MG disintegrating tablet Commonly known as:  MAXALT-MLT Take 1 tablet (10 mg total) by mouth as needed. May repeat in 2 hours if needed   TRESIBA FLEXTOUCH 200 UNIT/ML Sopn Generic drug:  Insulin Degludec INJECT 60 UNITS SUBCUTANEOUSLY ONCE DAILY       Allergies:  Allergies  Allergen Reactions  . Shellfish Allergy Swelling  . Gabapentin Other (See Comments)    Disoriented    Past Medical History:  Diagnosis Date  . Diabetes mellitus    type II controlled  . Hyperlipidemia   . Hypertension   . Migraine    common  . Obesity   . Palpitations   . Plantar fasciitis   . S/P tonsillectomy   . Vertigo     Past Surgical History:  Procedure Laterality Date  . BLADDER SURGERY  1990   bladder tack after 2nd delivery    Family History  Problem Relation Age of Onset  . Arthritis Mother        RA  . Hypertension Mother   . Drug abuse Mother   . Depression Mother   . Depression Sister   . Diabetes Sister   . Cancer Sister        thyroid cancer, reoccured 2008  . Depression Sister   . Drug abuse Brother   . Stroke Maternal Grandmother   . Alzheimer's disease Maternal Grandmother   . Diabetes Maternal Grandmother   . Depression Sister     Social History:  reports that she has never smoked. She has never used smokeless tobacco. She reports that she does not drink alcohol or use drugs.    REVIEW of Systems  Lipid history: On treatment since about 2009 Fasting lipid still not  adequate on her regimen of atorvastatin prescribed by PCP     Lab Results  Component Value Date   CHOL 166 04/12/2018   HDL 40.00 04/12/2018   LDLCALC 63 04/10/2015   LDLDIRECT 108.0 04/12/2018   TRIG 222.0 (H) 04/12/2018   CHOLHDL 4 04/12/2018           Hypertension: Has been present for several years requiring multiple drugs   Blood pressure control and edema is better with adding HCTZ this year   BP Readings from Last 3 Encounters:  09/20/18 128/60  06/14/18 130/78  04/14/18 122/88     Most recent foot exam: 02/2018     LABS:  Lab on 09/17/2018  Component Date Value Ref Range Status  . Sodium 09/17/2018 137  135 - 145 mEq/L Final  . Potassium 09/17/2018 4.0  3.5 - 5.1 mEq/L Final  . Chloride 09/17/2018 100  96 - 112 mEq/L Final  . CO2 09/17/2018 30  19 - 32 mEq/L Final  . Glucose, Bld 09/17/2018 233* 70 - 99 mg/dL Final  . BUN 09/17/2018 13  6 - 23 mg/dL Final  . Creatinine, Ser 09/17/2018 0.78  0.40 - 1.20 mg/dL Final  . Calcium 09/17/2018 8.8  8.4 - 10.5 mg/dL Final  . GFR 09/17/2018 81.59  >60.00 mL/min Final  .  Hgb A1c MFr Bld 09/17/2018 6.5  4.6 - 6.5 % Final   Glycemic Control Guidelines for People with Diabetes:Non Diabetic:  <6%Goal of Therapy: <7%Additional Action Suggested:  >8%     Physical Examination:  BP 128/60   Pulse 85   Ht 5\' 4"  (1.626 m)   Wt 221 lb (100.2 kg)   SpO2 96%   BMI 37.93 kg/m   No edema    ASSESSMENT:  Diabetes type 2, insulin-dependent  See history of present illness for detailed discussion of current diabetes management, blood sugar patterns and problems identified  Her A1c is again improved at 6.5  She again has done well with using Ozempic in addition to her insulin and appears to be needing less mealtime coverage Also apparently she is taking 1 mg weekly even though she was started on 0.5 mg 1 mg prescription was done by previous endocrinologist  Since her blood sugars are lower she appears to be getting  either low or low normal readings overnight or before lunch with her basal insulin dose of 60 units She is not adjusting the dose and does not know how to do this POSTPRANDIAL readings are higher when she has any significant amount of carbohydrate at lunch or dinner but she is not covering her meals consistently or proactively Blood sugar patterns were reviewed with the patient Also discussed that her freestyle Elenor Legato is likely giving falsely low readings with blood sugars are low as 40 recently without consistent symptoms  HYPERTENSION: Currently controlled   PLAN:     She will need to take NovoLog more consistently with meals that have carbohydrate at lunch or dinner, at least 4 units  She will need to do that regardless of pre-meal blood sugar  Most likely will need to adjust her readings on the freestyle libre to correct for the falsely low numbers by about 10-30  Continue 1 mg Ozempic  Reduce Tresiba by 10 units  Given her a flowsheet with detailed instructions on how to adjust her Tyler Aas every 3 to 5 days by 2 units to keep her morning sugars between 70-120  No regular exercise  Consider follow-up with dietitian for weight loss  Follow-up in 3 months but she apparently is considering switching physicians because of her insurance now  Patient Instructions  Check blood sugars on waking up 7 days a week  Also check blood sugars about 2 hours after meals and do this after different meals by rotation  Recommended blood sugar levels on waking up are 70-120 and about 2 hours after meal is 130-160  Please bring your blood sugar monitor to each visit, thank you  If eating Carbs take 4-8 at meals Novolog   Tresiba 50 units and keep am sugar as above in am  Must do Novolog before each meal that has carbs       Counseling time on subjects discussed in assessment and plan sections is over 50% of today's 25 minute visit       Elayne Snare 09/20/2018, 4:15 PM   Note:  This office note was prepared with Dragon voice recognition system technology. Any transcriptional errors that result from this process are unintentional.

## 2018-09-20 ENCOUNTER — Ambulatory Visit (INDEPENDENT_AMBULATORY_CARE_PROVIDER_SITE_OTHER): Payer: BLUE CROSS/BLUE SHIELD | Admitting: Endocrinology

## 2018-09-20 ENCOUNTER — Encounter: Payer: Self-pay | Admitting: Endocrinology

## 2018-09-20 VITALS — BP 128/60 | HR 85 | Ht 64.0 in | Wt 221.0 lb

## 2018-09-20 DIAGNOSIS — E782 Mixed hyperlipidemia: Secondary | ICD-10-CM | POA: Diagnosis not present

## 2018-09-20 DIAGNOSIS — I10 Essential (primary) hypertension: Secondary | ICD-10-CM

## 2018-09-20 DIAGNOSIS — E1165 Type 2 diabetes mellitus with hyperglycemia: Secondary | ICD-10-CM

## 2018-09-20 DIAGNOSIS — Z794 Long term (current) use of insulin: Secondary | ICD-10-CM

## 2018-09-20 NOTE — Patient Instructions (Addendum)
Check blood sugars on waking up 7 days a week  Also check blood sugars about 2 hours after meals and do this after different meals by rotation  Recommended blood sugar levels on waking up are 70-120 and about 2 hours after meal is 130-160  Please bring your blood sugar monitor to each visit, thank you  If eating Carbs take 4-8 at meals Novolog   Tresiba 50 units and keep am sugar as above in am  Must do Novolog before each meal that has carbs

## 2018-09-22 ENCOUNTER — Other Ambulatory Visit: Payer: Self-pay | Admitting: Endocrinology

## 2018-10-15 ENCOUNTER — Other Ambulatory Visit: Payer: Self-pay | Admitting: Endocrinology

## 2018-10-27 ENCOUNTER — Other Ambulatory Visit: Payer: Self-pay | Admitting: Endocrinology

## 2018-11-14 ENCOUNTER — Other Ambulatory Visit: Payer: Self-pay | Admitting: Endocrinology

## 2018-11-24 ENCOUNTER — Telehealth: Payer: Self-pay | Admitting: Endocrinology

## 2018-11-24 NOTE — Telephone Encounter (Signed)
Patient called to advise that they now have [place] with [facility] and are no longer in network with [place].  Patient is requesting a referral be sent to the Referral Coordinator for [facility].  That fax number is [phone].

## 2018-11-24 NOTE — Telephone Encounter (Signed)
Do you want to do this or have it come from PCP

## 2018-11-25 NOTE — Telephone Encounter (Signed)
Called pt and informed her of this and pt stated that the new endo office requested that the referral come from the pt's previous endocrinologist.

## 2018-11-25 NOTE — Telephone Encounter (Signed)
Would prefer PCPs office do the referral

## 2018-11-26 ENCOUNTER — Other Ambulatory Visit: Payer: Self-pay | Admitting: Endocrinology

## 2018-11-26 DIAGNOSIS — E1165 Type 2 diabetes mellitus with hyperglycemia: Secondary | ICD-10-CM

## 2018-11-26 DIAGNOSIS — Z794 Long term (current) use of insulin: Secondary | ICD-10-CM

## 2018-12-01 ENCOUNTER — Telehealth: Payer: Self-pay | Admitting: Endocrinology

## 2018-12-01 ENCOUNTER — Other Ambulatory Visit: Payer: Self-pay

## 2018-12-01 ENCOUNTER — Telehealth: Payer: Self-pay | Admitting: Family Medicine

## 2018-12-01 DIAGNOSIS — E1165 Type 2 diabetes mellitus with hyperglycemia: Secondary | ICD-10-CM

## 2018-12-01 MED ORDER — INSULIN DEGLUDEC 200 UNIT/ML ~~LOC~~ SOPN: 60.0000 [IU] | PEN_INJECTOR | Freq: Every day | SUBCUTANEOUS | 1 refills | Status: AC

## 2018-12-01 NOTE — Telephone Encounter (Signed)
Pt is calling and stating due to her insurance she has to start going to an endocrinology office in [facility]. Pt need a referral to [facility] adivse fax # [phone]. Pt stated Dr Ronnie Derby office wouldn't do the referral and we had to.

## 2018-12-01 NOTE — Telephone Encounter (Signed)
I did the referral  Will route to [facility]

## 2018-12-01 NOTE — Telephone Encounter (Signed)
rx sent

## 2018-12-01 NOTE — Telephone Encounter (Signed)
MEDICATION: Tresiba 200 u  PHARMACY:  [facility] on [address] [phone]  IS THIS A 67 DAY SUPPLY : y  IS PATIENT OUT OF MEDICATION: y  IF NOT; HOW MUCH IS LEFT:   LAST APPOINTMENT DATE: @2 /26/2020  NEXT APPOINTMENT DATE:being referred to [facility] TO LEAVE A DETAILED MESSAGE:  OTHER COMMENTS: please call in asap   **Let patient know to contact pharmacy at the end of the day to make sure medication is ready. **  ** Please notify patient to allow 48-72 hours to process**  **Encourage patient to contact the pharmacy for refills or they can request refills through [facility]**

## 2018-12-01 NOTE — Telephone Encounter (Signed)
Referral with records has been sent with confirmation received. Patient is aware

## 2018-12-03 NOTE — Telephone Encounter (Signed)
[facility] did Referral to [facility] for the patient, per Mingo Amber. Sent message to patient that Referral has been sent.

## 2018-12-15 ENCOUNTER — Other Ambulatory Visit: Payer: Self-pay | Admitting: Endocrinology

## 2018-12-15 ENCOUNTER — Telehealth: Payer: Self-pay | Admitting: Endocrinology

## 2018-12-15 NOTE — Telephone Encounter (Signed)
Patient called the office to speak with [facility]. She did not mention the reason for the call  Patient left a number of  [phone]

## 2018-12-16 NOTE — Telephone Encounter (Signed)
refaxed the [facility] endocrinology referral to them for the patient

## 2019-07-06 IMAGING — CT CT HEAD W/O CM
3 series · 16 of 47 positions shown, 19 images · non-contrast
Comparison: Face CT 08/07/2016.

CLINICAL DATA: 52-year-old female with intermittent headaches and
right ear pain for 2-3 weeks.

EXAM:
CT HEAD WITHOUT CONTRAST
TECHNIQUE: Contiguous axial images were obtained from the base of the skull
through the vertex without intravenous contrast.

[Series 2: head wo · axial · 0.47mm/px · z∈[-138,-13]mm · 10 of 30 slices shown, 13 images]
[im 3/30  brain]
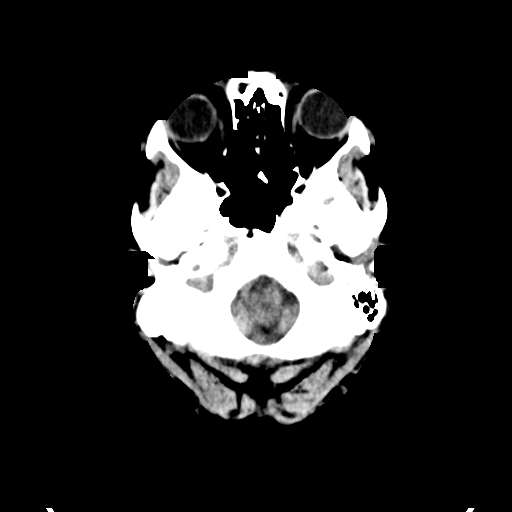
[im 3/30  bone]
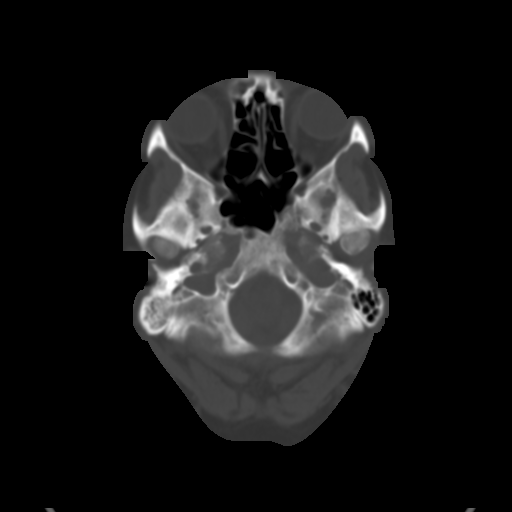
[im 6/30  brain]
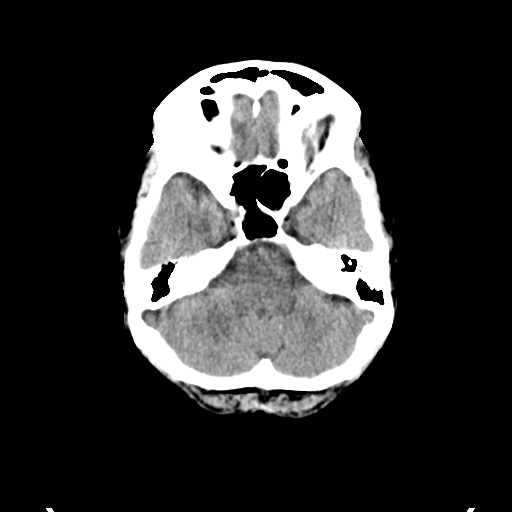
[im 9/30  brain]
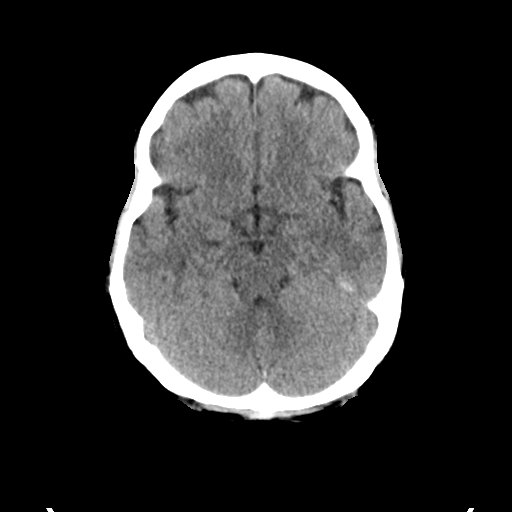
[im 11/30  brain]
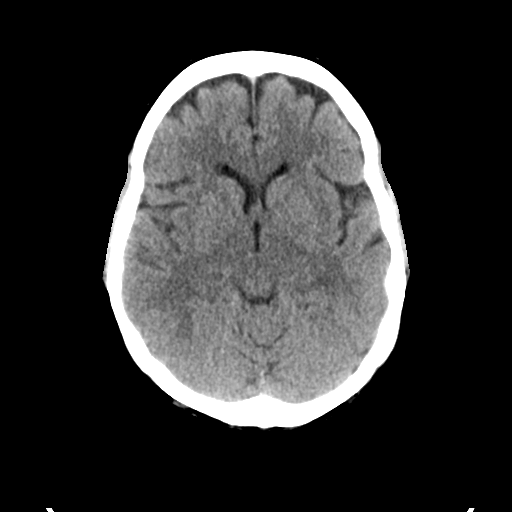
[im 14/30  brain]
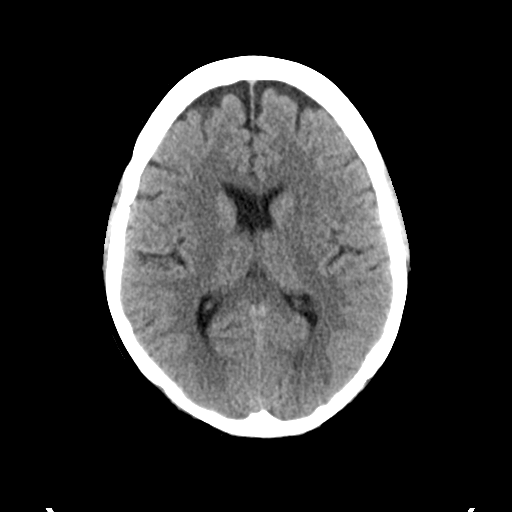
[im 14/30  bone]
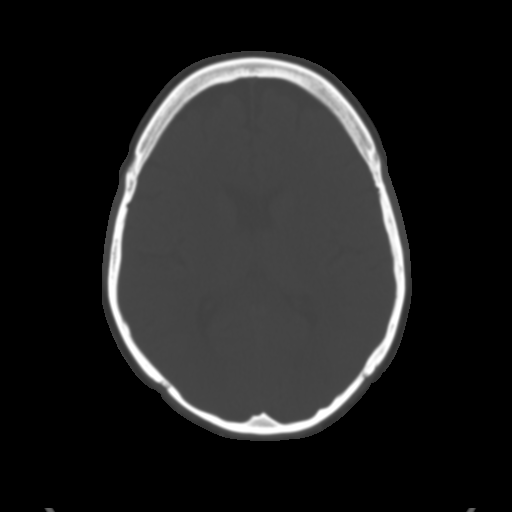
[im 17/30  brain]
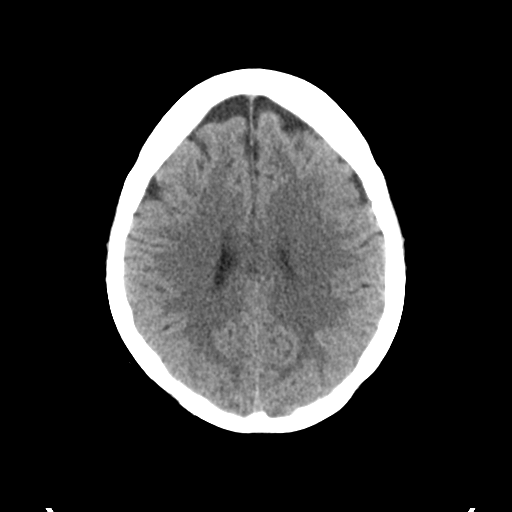
[im 20/30  brain]
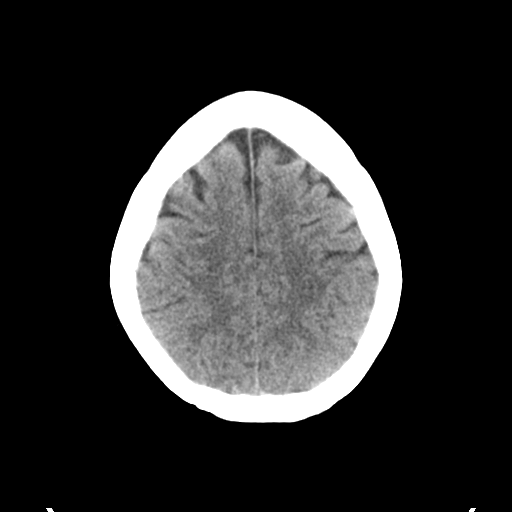
[im 23/30  brain]
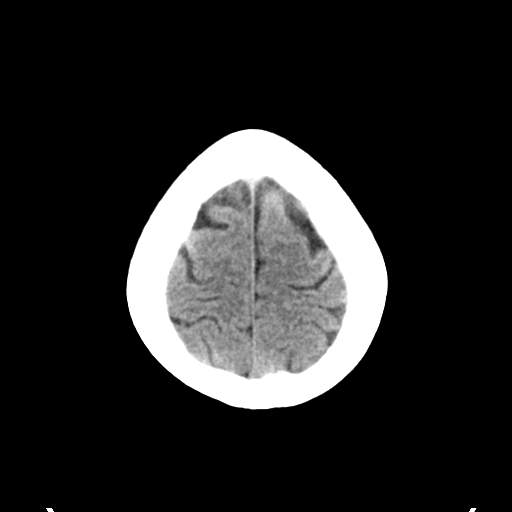
[im 25/30  brain]
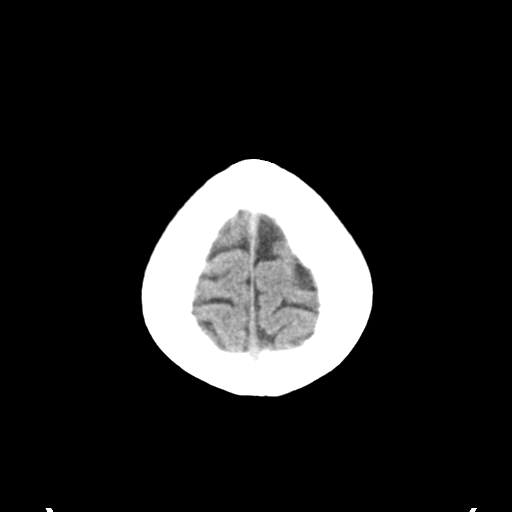
[im 25/30  bone]
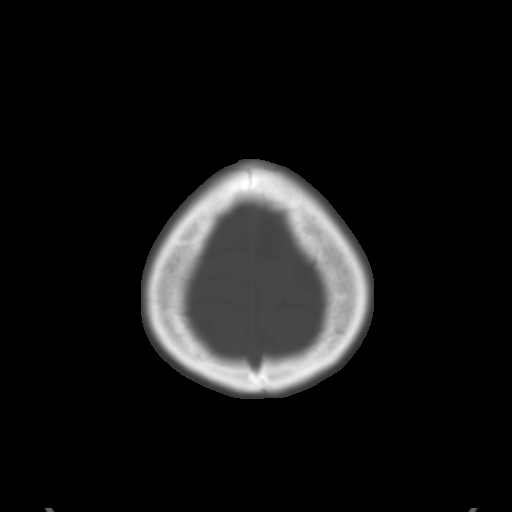
[im 28/30  brain]
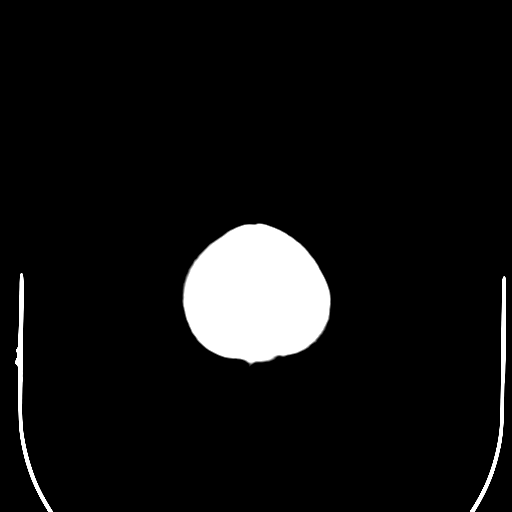

[Series 4: coronal soft tissue · coronal · 0.29mm/px · 3 of 62 slices shown]
[im 21/62  brain]
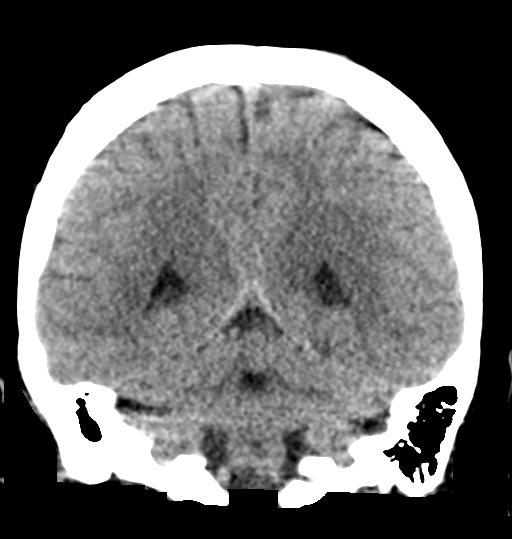
[im 28/62  brain]
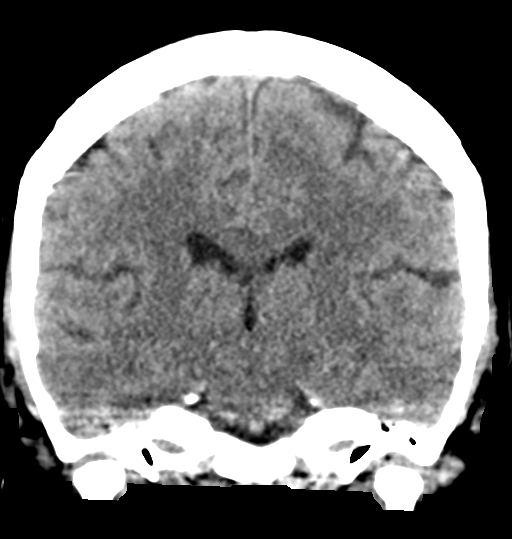
[im 34/62  brain]
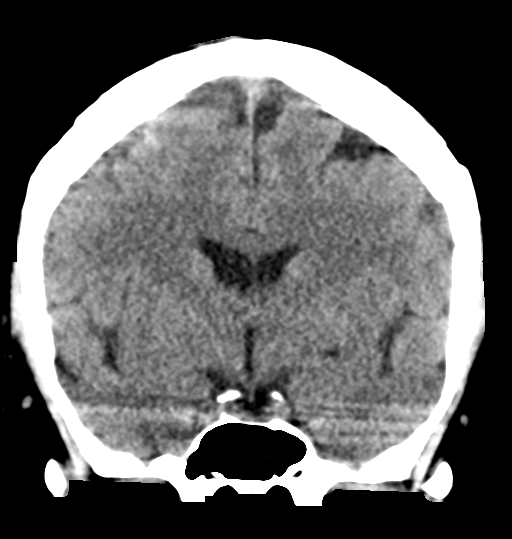

[Series 5: sagittal soft tissue · sagittal · 0.30mm/px · 3 of 55 slices shown]
[im 19/55  brain]
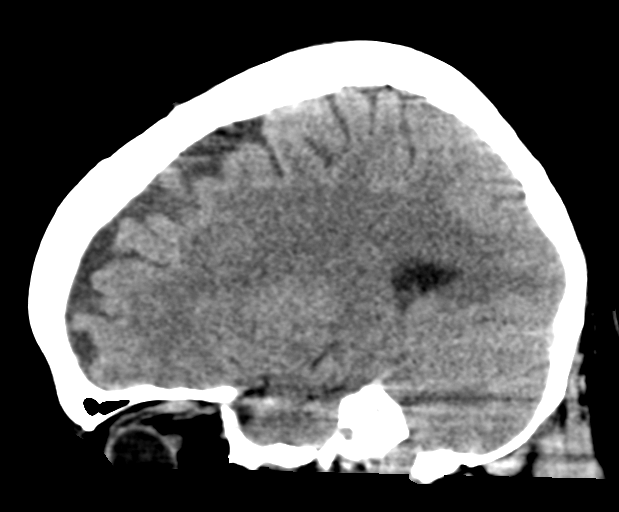
[im 28/55  brain]
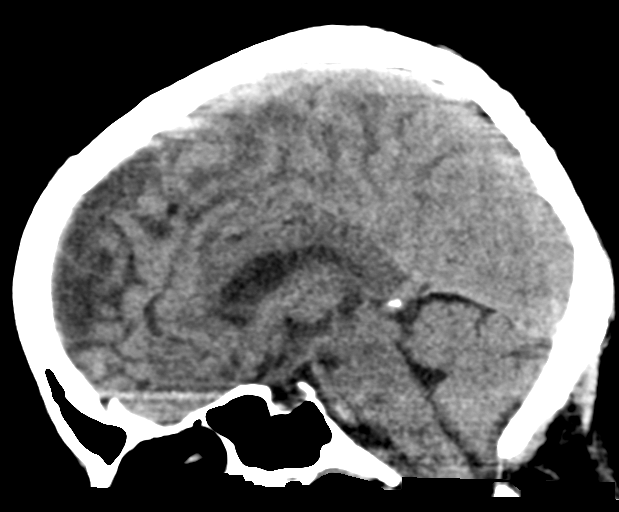
[im 37/55  brain]
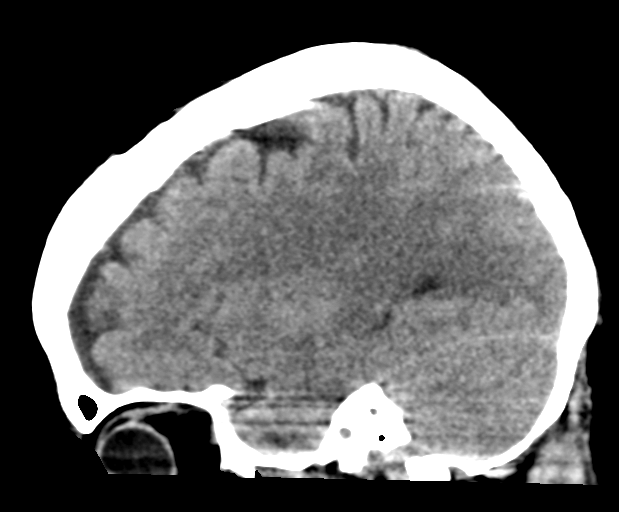

[16 of 47 positions shown; findings below may reference images not displayed]

FINDINGS: Brain: No midline shift, ventriculomegaly, mass effect, evidence of
mass lesion, intracranial hemorrhage or evidence of cortically based
acute infarction. Gray-white matter differentiation is within normal
limits throughout the brain.

Vascular: No suspicious intracranial vascular hyperdensity.

Skull: Negative.

Sinuses/Orbits: The visible paranasal sinuses are clear. The
bilateral tympanic cavities and mastoids appear clear. The right EAC
appears normal.

Other: Visualized orbits and scalp soft tissues are within normal
limits.
IMPRESSION: Normal noncontrast Head CT.

## 2019-07-12 ENCOUNTER — Other Ambulatory Visit: Payer: Self-pay

## 2019-07-12 DIAGNOSIS — Z20822 Contact with and (suspected) exposure to covid-19: Secondary | ICD-10-CM

## 2019-07-14 LAB — NOVEL CORONAVIRUS, NAA: SARS-CoV-2, NAA: NOT DETECTED

## 2019-07-21 ENCOUNTER — Ambulatory Visit (INDEPENDENT_AMBULATORY_CARE_PROVIDER_SITE_OTHER): Payer: BLUE CROSS/BLUE SHIELD | Admitting: Family Medicine

## 2019-07-21 ENCOUNTER — Encounter: Payer: Self-pay | Admitting: Family Medicine

## 2019-07-21 VITALS — BP 121/85 | HR 85 | Temp 98.8°F | Wt 211.0 lb

## 2019-07-21 DIAGNOSIS — R202 Paresthesia of skin: Secondary | ICD-10-CM | POA: Diagnosis not present

## 2019-07-21 DIAGNOSIS — H9203 Otalgia, bilateral: Secondary | ICD-10-CM

## 2019-07-21 DIAGNOSIS — F4323 Adjustment disorder with mixed anxiety and depressed mood: Secondary | ICD-10-CM | POA: Diagnosis not present

## 2019-07-21 DIAGNOSIS — N951 Menopausal and female climacteric states: Secondary | ICD-10-CM | POA: Diagnosis not present

## 2019-07-21 DIAGNOSIS — E1165 Type 2 diabetes mellitus with hyperglycemia: Secondary | ICD-10-CM

## 2019-07-21 MED ORDER — PAROXETINE HCL 20 MG PO TABS: 20.0000 mg | ORAL_TABLET | Freq: Every day | ORAL | 3 refills | Status: DC

## 2019-07-21 NOTE — Progress Notes (Signed)
Virtual Visit via Video Note  I connected with Stacy Monroe on 07/21/19 at 10:15 AM EDT by a video enabled telemedicine application and verified that I am speaking with the correct person using two identifiers.  Location: Patient: home Provider: office    I discussed the limitations of evaluation and management by telemedicine and the availability of in person appointments. The patient expressed understanding and agreed to proceed.  History of Present Illness: Pt presents with many c/o and f/u of chronic problems   Her ins changed and could not come here for a while /now has to pay out of pocket   Sleep issues Ear pain  ST Itching GI problems  Neuropathy  Refill of paxil  Wt Readings from Last 3 Encounters:  07/21/19 211 lb (95.7 kg)  09/20/18 221 lb (100.2 kg)  06/14/18 218 lb (98.9 kg)   Seeing new endocrinologist for DM2- [facility]  Put her on jardiance -it causes some vaginal itching and burning  It is working for her glucose and she does not want to come off of it  Last a1c was 8.2  Since then her glucose levels have greatly improved avg readings 130-140    Eating well Needs to start exercise - making a plan to do it    Ran out of her paxil 2 weeks ago  - this makes tingling worse/itching /light headed/not sleeping  More anxious  Night sweats/hot /cold with menopause   Ear problems are back   has had amox in the past  Has allergies Has not used nasal steroid recently  Patient Active Problem List   Diagnosis Date Noted  . Screening mammogram, encounter for 03/09/2018  . Chronic migraine 02/18/2018  . Paresthesia 02/18/2018  . Neck pain on right side 02/18/2018  . Allergic rhinitis 06/26/2017  . Ear pain, bilateral 08/07/2016  . Shingles 08/06/2016  . Perimenopause 03/10/2016  . Neck pain 10/16/2015  . Colon cancer screening 01/10/2015  . Routine general medical examination at a health care facility 01/02/2015  . Menorrhagia 06/14/2014  . Low  back pain 11/25/2013  . Abnormal EKG 01/08/2012  . Chest pressure 12/04/2011  . Adjustment disorder with mixed anxiety and depressed mood 06/24/2011  . Elevated transaminase level 01/21/2011  . PLANTAR FASCIITIS 10/02/2010  . Vitamin D deficiency 04/26/2010  . Essential hypertension 02/15/2009  . Diabetes mellitus type 2, uncontrolled ([place]) 09/07/2008  . Hyperlipidemia 09/07/2008  . Obesity 04/24/2008  . Migraine without aura 04/18/2008   Past Medical History:  Diagnosis Date  . Diabetes mellitus    type II controlled  . Hyperlipidemia   . Hypertension   . Migraine    common  . Obesity   . Palpitations   . Plantar fasciitis   . S/P tonsillectomy   . Vertigo    Past Surgical History:  Procedure Laterality Date  . BLADDER SURGERY  1990   bladder tack after 2nd delivery   Social History   Tobacco Use  . Smoking status: Never Smoker  . Smokeless tobacco: Never Used  Substance Use Topics  . Alcohol use: No    Alcohol/week: 0.0 standard drinks  . Drug use: No   Family History  Problem Relation Age of Onset  . Arthritis Mother        RA  . Hypertension Mother   . Drug abuse Mother   . Depression Mother   . Depression Sister   . Diabetes Sister   . Cancer Sister  thyroid cancer, reoccured 2008  . Depression Sister   . Drug abuse Brother   . Stroke Maternal Grandmother   . Alzheimer's disease Maternal Grandmother   . Diabetes Maternal Grandmother   . Depression Sister    Allergies  Allergen Reactions  . Shellfish Allergy Swelling  . Gabapentin Other (See Comments)    Disoriented   Current Outpatient Medications on File Prior to Visit  Medication Sig Dispense Refill  . albuterol (PROVENTIL HFA;VENTOLIN HFA) 108 (90 Base) MCG/ACT inhaler Inhale 2 puffs into the lungs every 6 (six) hours as needed for wheezing or shortness of breath. 1 Inhaler 2  . amLODipine (NORVASC) 5 MG tablet Take 1 tablet (5 mg total) by mouth daily. 30 tablet 11  . aspirin 81 MG  tablet Take 81 mg by mouth daily.      Marland Kitchen atorvastatin (LIPITOR) 20 MG tablet TAKE ONE TABLET BY MOUTH ONCE DAILY 90 tablet 0  . cetirizine (ZYRTEC) 10 MG tablet TAKE ONE TABLET BY MOUTH EVERY DAY 30 tablet 2  . Cholecalciferol (VITAMIN D3 PO) Take 5,000 Units by mouth daily.    . Continuous Blood Gluc Sensor (FREESTYLE LIBRE 14 DAY SENSOR) MISC USE ONE SENSOR EVERY 14 DAYS 2 each 0  . empagliflozin (JARDIANCE) 10 MG TABS tablet Take 10 mg by mouth daily.    . hydrochlorothiazide (MICROZIDE) 12.5 MG capsule TAKE 1 CAPSULE BY MOUTH ONCE DAILY 30 capsule 0  . Insulin Degludec (TRESIBA FLEXTOUCH) 200 UNIT/ML SOPN Inject 60 Units into the skin daily. (Patient taking differently: Inject 40 Units into the skin daily. ) 9 pen 1  . lisinopril (PRINIVIL,ZESTRIL) 20 MG tablet TAKE ONE TABLET BY MOUTH ONCE DAILY 90 tablet 0  . metFORMIN (GLUCOPHAGE-XR) 500 MG 24 hr tablet Take 3 tablets (1,500 mg total) by mouth every evening. 30 tablet 90  . omeprazole (PRILOSEC OTC) 20 MG tablet Take 20 mg by mouth daily.    . rizatriptan (MAXALT-MLT) 10 MG disintegrating tablet Take 1 tablet (10 mg total) by mouth as needed. May repeat in 2 hours if needed 12 tablet 6  . Semaglutide (OZEMPIC) 0.25 or 0.5 MG/DOSE SOPN Inject 0.5 mg into the skin once a week.     . insulin aspart (NOVOLOG FLEXPEN) 100 UNIT/ML FlexPen 14 units actid (Patient not taking: Reported on 07/21/2019) 15 mL 3  . [DISCONTINUED] ipratropium (ATROVENT) 0.03 % nasal spray Place 2 sprays into the nose 3 (three) times daily. 30 mL 2   No current facility-administered medications on file prior to visit.    Review of Systems  Constitutional: Negative for chills, fever and malaise/fatigue.  HENT: Positive for ear pain. Negative for congestion, ear discharge, hearing loss, sinus pain, sore throat and tinnitus.   Eyes: Negative for blurred vision, discharge and redness.  Respiratory: Negative for cough, shortness of breath and stridor.   Cardiovascular:  Negative for chest pain, palpitations and leg swelling.  Gastrointestinal: Negative for abdominal pain, diarrhea, nausea and vomiting.       Indigestion/gas  Musculoskeletal: Negative for myalgias.  Skin: Negative for rash.  Neurological: Positive for tingling. Negative for dizziness and headaches.  Psychiatric/Behavioral: Negative for memory loss and suicidal ideas. The patient is nervous/anxious.     Observations/Objective: Patient appears well, in no distress Weight is baseline -obese No facial swelling or asymmetry Normal voice-not hoarse and no slurred speech No obvious tremor or mobility impairment Moving neck and UEs normally Able to hear the call well  No cough or shortness of breath during  interview  Talkative and mentally sharp with no cognitive changes No skin changes on face or neck , no rash or pallor Affect is mildly anxious but pleasant    Assessment and Plan: Problem List Items Addressed This Visit      Endocrine   Diabetes mellitus type 2, uncontrolled ([place])    Continues endocrinology care  Newly on Jardiance- is helping glucose control but she c/o yeast infections  Plans to discuss with her treating provider  Suggest use of probiotic otc       Relevant Medications   empagliflozin (JARDIANCE) 10 MG TABS tablet     Other   Adjustment disorder with mixed anxiety and depressed mood - Primary    Some emotional symptoms causing worse somatic ones including GI distress/worse paresthesias  Thinks she had some w/d symptoms when stopping it We will re start paroxetine at 20 mg daily  F/u if no improvement Discussed expectations of SSRI medication including time to effectiveness and mechanism of action, also poss of side effects (early and late)- including mental fuzziness, weight or appetite change, nausea and poss of worse dep or anxiety (even suicidal thoughts)  Pt voiced understanding and will stop med and update if this occurs        Perimenopause   Ear pain,  bilateral    Suspect due to ETD (recurrent) Will start back on otc nasal steroid spray through allergy season Update if not starting to improve in a week or if worsening  (esp if worse pain or any fever)       Paresthesia       Follow Up Instructions: Get back on generic paxil 20 mg daily  If symptoms do not improve please let me know  Think about starting some exercise   Talk to your endocrinologist about jardiance side effect of yeast infections Do start a pro biotic   Start a steroid nasal spray otc for your ear pain  Follow up if no improvement    I discussed the assessment and treatment plan with the patient. The patient was provided an opportunity to ask questions and all were answered. The patient agreed with the plan and demonstrated an understanding of the instructions.   The patient was advised to call back or seek an in-person evaluation if the symptoms worsen or if the condition fails to improve as anticipated.     Loura Pardon, MD

## 2019-07-21 NOTE — Patient Instructions (Signed)
Get back on generic paxil 20 mg daily  If symptoms do not improve please let me know  Think about starting some exercise   Talk to your endocrinologist about jardiance side effect of yeast infections Do start a pro biotic   Start a steroid nasal spray otc for your ear pain  Follow up if no improvement

## 2019-07-22 NOTE — Assessment & Plan Note (Signed)
Continues endocrinology care  Newly on Jardiance- is helping glucose control but she c/o yeast infections  Plans to discuss with her treating provider  Suggest use of probiotic otc

## 2019-07-22 NOTE — Assessment & Plan Note (Signed)
Some emotional symptoms causing worse somatic ones including GI distress/worse paresthesias  Thinks she had some w/d symptoms when stopping it We will re start paroxetine at 20 mg daily  F/u if no improvement Discussed expectations of SSRI medication including time to effectiveness and mechanism of action, also poss of side effects (early and late)- including mental fuzziness, weight or appetite change, nausea and poss of worse dep or anxiety (even suicidal thoughts)  Pt voiced understanding and will stop med and update if this occurs

## 2019-07-22 NOTE — Assessment & Plan Note (Signed)
Suspect due to ETD (recurrent) Will start back on otc nasal steroid spray through allergy season Update if not starting to improve in a week or if worsening  (esp if worse pain or any fever)

## 2019-09-27 ENCOUNTER — Ambulatory Visit: Payer: BLUE CROSS/BLUE SHIELD | Attending: Internal Medicine

## 2019-09-27 DIAGNOSIS — Z20822 Contact with and (suspected) exposure to covid-19: Secondary | ICD-10-CM

## 2019-09-28 LAB — NOVEL CORONAVIRUS, NAA: SARS-CoV-2, NAA: NOT DETECTED

## 2020-03-29 ENCOUNTER — Other Ambulatory Visit: Payer: Self-pay | Admitting: Endocrinology

## 2020-05-12 ENCOUNTER — Other Ambulatory Visit: Payer: Self-pay | Admitting: Family Medicine

## 2020-08-10 ENCOUNTER — Emergency Department
Admission: EM | Admit: 2020-08-10 | Discharge: 2020-08-10 | Disposition: A | Discharge status: Home / Self Care | Payer: BLUE CROSS/BLUE SHIELD | Attending: Emergency Medicine | Admitting: Emergency Medicine

## 2020-08-10 ENCOUNTER — Emergency Department: Payer: BLUE CROSS/BLUE SHIELD

## 2020-08-10 ENCOUNTER — Other Ambulatory Visit: Payer: Self-pay

## 2020-08-10 DIAGNOSIS — E1165 Type 2 diabetes mellitus with hyperglycemia: Secondary | ICD-10-CM | POA: Insufficient documentation

## 2020-08-10 DIAGNOSIS — R079 Chest pain, unspecified: Secondary | ICD-10-CM

## 2020-08-10 DIAGNOSIS — Z20822 Contact with and (suspected) exposure to covid-19: Secondary | ICD-10-CM | POA: Insufficient documentation

## 2020-08-10 DIAGNOSIS — Z7984 Long term (current) use of oral hypoglycemic drugs: Secondary | ICD-10-CM | POA: Diagnosis not present

## 2020-08-10 DIAGNOSIS — R0602 Shortness of breath: Secondary | ICD-10-CM | POA: Insufficient documentation

## 2020-08-10 DIAGNOSIS — Z79899 Other long term (current) drug therapy: Secondary | ICD-10-CM | POA: Insufficient documentation

## 2020-08-10 DIAGNOSIS — R42 Dizziness and giddiness: Secondary | ICD-10-CM | POA: Diagnosis not present

## 2020-08-10 DIAGNOSIS — I10 Essential (primary) hypertension: Secondary | ICD-10-CM | POA: Insufficient documentation

## 2020-08-10 DIAGNOSIS — R0789 Other chest pain: Secondary | ICD-10-CM | POA: Insufficient documentation

## 2020-08-10 DIAGNOSIS — Z7982 Long term (current) use of aspirin: Secondary | ICD-10-CM | POA: Insufficient documentation

## 2020-08-10 DIAGNOSIS — Z794 Long term (current) use of insulin: Secondary | ICD-10-CM | POA: Diagnosis not present

## 2020-08-10 DIAGNOSIS — R739 Hyperglycemia, unspecified: Secondary | ICD-10-CM

## 2020-08-10 LAB — BASIC METABOLIC PANEL
Anion gap: 13 (ref 5–15)
BUN: 12 mg/dL (ref 6–20)
CO2: 24 mmol/L (ref 22–32)
Calcium: 8.8 mg/dL — ABNORMAL LOW (ref 8.9–10.3)
Chloride: 98 mmol/L (ref 98–111)
Creatinine, Ser: 0.87 mg/dL (ref 0.44–1.00)
GFR, Estimated: >60 mL/min (ref >60)
Glucose, Bld: 212 mg/dL — ABNORMAL HIGH (ref 70–99)
Potassium: 3.8 mmol/L (ref 3.5–5.1)
Sodium: 135 mmol/L (ref 135–145)

## 2020-08-10 LAB — TROPONIN I (HIGH SENSITIVITY)
Troponin I (High Sensitivity): 3 ng/L (ref <18)
Troponin I (High Sensitivity): 3 ng/L (ref <18)

## 2020-08-10 LAB — CBC
HCT: 37.5 % (ref 36.0–46.0)
Hemoglobin: 11.9 g/dL — ABNORMAL LOW (ref 12.0–15.0)
MCH: 25.8 pg — ABNORMAL LOW (ref 26.0–34.0)
MCHC: 31.7 g/dL (ref 30.0–36.0)
MCV: 81.2 fL (ref 80.0–100.0)
Platelets: 323 10*3/uL (ref 150–400)
RBC: 4.62 MIL/uL (ref 3.87–5.11)
RDW: 15.4 % (ref 11.5–15.5)
WBC: 7.7 10*3/uL (ref 4.0–10.5)
nRBC: 0 % (ref 0.0–0.2)

## 2020-08-10 LAB — CBG MONITORING, ED
Glucose-Capillary: 208 mg/dL — ABNORMAL HIGH (ref 70–99)
Glucose-Capillary: 203 mg/dL — ABNORMAL HIGH (ref 70–99)

## 2020-08-10 LAB — RESPIRATORY PANEL BY RT PCR (FLU A&B, COVID)
Influenza A by PCR: NEGATIVE
Influenza B by PCR: NEGATIVE
SARS Coronavirus 2 by RT PCR: NEGATIVE

## 2020-08-10 LAB — HEMOGLOBIN A1C
Hgb A1c MFr Bld: 7.2 % — ABNORMAL HIGH (ref 4.8–5.6)
Mean Plasma Glucose: 159.94 mg/dL

## 2020-08-10 LAB — MAGNESIUM: Magnesium: 2 mg/dL (ref 1.7–2.4)

## 2020-08-10 LAB — FIBRIN DERIVATIVES D-DIMER (ARMC ONLY): Fibrin derivatives D-dimer (ARMC): 432.78 ng/mL (FEU) (ref 0.00–499.00)

## 2020-08-10 LAB — TSH: TSH: 1.695 u[IU]/mL (ref 0.350–4.500)

## 2020-08-10 LAB — BRAIN NATRIURETIC PEPTIDE: B Natriuretic Peptide: 30.6 pg/mL (ref 0.0–100.0)

## 2020-08-10 MED ORDER — INSULIN ASPART 100 UNIT/ML ~~LOC~~ SOLN: 0.0000 [IU] | SUBCUTANEOUS | Status: DC

## 2020-08-10 NOTE — ED Notes (Signed)
First Nurse Note: Pt to ED from [facility] for Dizziness and chest pain. Pt is in NAD. Pt has dry cough.

## 2020-08-10 NOTE — ED Triage Notes (Signed)
Pt via POV from home. Pt states that at 1230 she began to have significant centralized chest pressure, pt also c/o SOB at rest. No radiation. Pt is A&Ox4 and NAD.

## 2020-08-10 NOTE — ED Provider Notes (Signed)
[facility] Emergency Department Provider Note  ____________________________________________   First MD Initiated Contact with Patient 08/10/20 1534     (approximate)  I have reviewed the triage vital signs and the nursing notes.   HISTORY  Chief Complaint Chest Pain   HPI Stacy Monroe is a 54 y.o. female with past medical history of insulin-dependent DM, HTN, HDL, migraines, palpitations, and obesity as well as recurrent vertigo who presents for assessment of approximately 1 month of intermittent episodes of chest pressure associate with shortness of breath and dizziness.  Patient states his episodes of been happening every 3 to 4 days over the past month and she had an episode today that was particular worrisome for her motivating her to seek evaluation emergency room.  She also states he has had a dry cough over the last couple of days.  She states she does not have any pain only a little bit of chest pressure when the episodes occur.  Usually last minutes to hours.  They occur at rest and there is no clear precipitating alleviating aggravating factors including positional or exertional factors.  Patient states he currently is at baseline back to baseline and does not have any shortness of breath chest pain or dizziness.  She denies any recent fevers, chills, productive cough, sore throat, back pain, dental pain, vomiting, diarrhea, dysuria, rash, or focal extremity weakness numbness or paresthesias.  No recent traumatic injuries or falls.  Denies any EtOH use illicit drug use or tobacco abuse.         Past Medical History:  Diagnosis Date  . Diabetes mellitus    type II controlled  . Hyperlipidemia   . Hypertension   . Migraine    common  . Obesity   . Palpitations   . Plantar fasciitis   . S/P tonsillectomy   . Vertigo     Patient Active Problem List   Diagnosis Date Noted  . Screening mammogram, encounter for 03/09/2018  . Chronic migraine  02/18/2018  . Paresthesia 02/18/2018  . Neck pain on right side 02/18/2018  . Allergic rhinitis 06/26/2017  . Ear pain, bilateral 08/07/2016  . Shingles 08/06/2016  . Perimenopause 03/10/2016  . Neck pain 10/16/2015  . Colon cancer screening 01/10/2015  . Routine general medical examination at a health care facility 01/02/2015  . Menorrhagia 06/14/2014  . Low back pain 11/25/2013  . Abnormal EKG 01/08/2012  . Chest pressure 12/04/2011  . Adjustment disorder with mixed anxiety and depressed mood 06/24/2011  . Elevated transaminase level 01/21/2011  . PLANTAR FASCIITIS 10/02/2010  . Vitamin D deficiency 04/26/2010  . Essential hypertension 02/15/2009  . Diabetes mellitus type 2, uncontrolled ([place]) 09/07/2008  . Hyperlipidemia 09/07/2008  . Obesity 04/24/2008  . Migraine without aura 04/18/2008    Past Surgical History:  Procedure Laterality Date  . BLADDER SURGERY  1990   bladder tack after 2nd delivery    Prior to Admission medications   Medication Sig Start Date End Date Taking? Authorizing Provider  albuterol (PROVENTIL HFA;VENTOLIN HFA) 108 (90 Base) MCG/ACT inhaler Inhale 2 puffs into the lungs every 6 (six) hours as needed for wheezing or shortness of breath. 12/24/16   Briscoe Deutscher, DO  amLODipine (NORVASC) 5 MG tablet Take 1 tablet (5 mg total) by mouth daily. 02/10/18   Tower, Wynelle Fanny, MD  aspirin 81 MG tablet Take 81 mg by mouth daily.      [provider]  atorvastatin (LIPITOR) 20 MG tablet TAKE ONE  TABLET BY MOUTH ONCE DAILY 07/09/16   Tower, Wynelle Fanny, MD  cetirizine (ZYRTEC) 10 MG tablet TAKE ONE TABLET BY MOUTH EVERY DAY 02/02/13   Tower, Wynelle Fanny, MD  Cholecalciferol (VITAMIN D3 PO) Take 5,000 Units by mouth daily.    [provider]  Continuous Blood Gluc Sensor (FREESTYLE LIBRE 14 DAY SENSOR) MISC USE ONE SENSOR EVERY 14 DAYS 12/15/18   Elayne Snare, MD  empagliflozin (JARDIANCE) 10 MG TABS tablet Take 10 mg by mouth daily. 07/07/19   [provider]  hydrochlorothiazide (MICROZIDE) 12.5 MG capsule TAKE 1 CAPSULE BY MOUTH ONCE DAILY 10/16/18   Elayne Snare, MD  insulin aspart (NOVOLOG FLEXPEN) 100 UNIT/ML FlexPen 14 units actid Patient not taking: Reported on 07/21/2019 06/14/18   Elayne Snare, MD  Insulin Degludec (TRESIBA FLEXTOUCH) 200 UNIT/ML SOPN Inject 60 Units into the skin daily. Patient taking differently: Inject 40 Units into the skin daily.  12/01/18   Elayne Snare, MD  lisinopril (PRINIVIL,ZESTRIL) 20 MG tablet TAKE ONE TABLET BY MOUTH ONCE DAILY 12/13/15   Tower, Wynelle Fanny, MD  metFORMIN (GLUCOPHAGE-XR) 500 MG 24 hr tablet Take 3 tablets (1,500 mg total) by mouth every evening. 06/14/18   Elayne Snare, MD  omeprazole (PRILOSEC OTC) 20 MG tablet Take 20 mg by mouth daily.    [provider]  PARoxetine (PAXIL) 20 MG tablet Take 1 tablet (20 mg total) by mouth daily. NEEDS OFFICE VISIT 05/14/20   Tower, Wynelle Fanny, MD  rizatriptan (MAXALT-MLT) 10 MG disintegrating tablet Take 1 tablet (10 mg total) by mouth as needed. May repeat in 2 hours if needed 02/18/18   Marcial Pacas, MD  Semaglutide [facility]) 0.25 or 0.5 MG/DOSE SOPN Inject 0.5 mg into the skin once a week.     [provider]  ipratropium (ATROVENT) 0.03 % nasal spray Place 2 sprays into the nose 3 (three) times daily. 07/08/11 12/04/11  CoplandFrederico Hamman, MD    Allergies Shellfish allergy and Gabapentin  Family History  Problem Relation Age of Onset  . Arthritis Mother        RA  . Hypertension Mother   . Drug abuse Mother   . Depression Mother   . Depression Sister   . Diabetes Sister   . Cancer Sister        thyroid cancer, reoccured 2008  . Depression Sister   . Drug abuse Brother   . Stroke Maternal Grandmother   . Alzheimer's disease Maternal Grandmother   . Diabetes Maternal Grandmother   . Depression Sister     Social History Social History   Tobacco Use  . Smoking status: Never Smoker  . Smokeless tobacco: Never Used  Vaping Use  .  Vaping Use: Never used  Substance Use Topics  . Alcohol use: No    Alcohol/week: 0.0 standard drinks  . Drug use: No    Review of Systems  Review of Systems  Constitutional: Negative for chills and fever.  HENT: Negative for sore throat.   Eyes: Negative for pain.  Respiratory: Positive for shortness of breath. Negative for cough and stridor.   Cardiovascular: Positive for chest pain.  Gastrointestinal: Negative for vomiting.  Skin: Negative for rash.  Neurological: Positive for dizziness. Negative for seizures, loss of consciousness and headaches.  Psychiatric/Behavioral: Negative for suicidal ideas.  All other systems reviewed and are negative.     ____________________________________________   PHYSICAL EXAM:  VITAL SIGNS: ED Triage Vitals  Enc Vitals Group     BP 08/10/20  1432 (!) 143/49     Pulse Rate 08/10/20 1432 83     Resp 08/10/20 1432 20     Temp 08/10/20 1432 98.2 F (36.8 C)     Temp Source 08/10/20 1432 Oral     SpO2 08/10/20 1432 95 %     Weight 08/10/20 1433 215 lb (97.5 kg)     Height 08/10/20 1433 5\' 4"  (1.626 m)     Head Circumference --      Peak Flow --      Pain Score 08/10/20 1433 0     Pain Loc --      Pain Edu? --      Excl. in [place]? --    Vitals:   08/10/20 1630 08/10/20 1700  BP: (!) 150/84 136/88  Pulse: 76 81  Resp: 20 16  Temp:    SpO2: 95% 97%   Physical Exam Vitals and nursing note reviewed.  Constitutional:      General: She is not in acute distress.    Appearance: She is well-developed.  HENT:     Head: Normocephalic and atraumatic.     Right Ear: External ear normal.     Left Ear: External ear normal.     Nose: Nose normal.     Mouth/Throat:     Mouth: Mucous membranes are moist.  Eyes:     Conjunctiva/sclera: Conjunctivae normal.  Cardiovascular:     Rate and Rhythm: Normal rate and regular rhythm.     Pulses: Normal pulses.     Heart sounds: No murmur heard.   Pulmonary:     Effort: Pulmonary effort is  normal. No respiratory distress.     Breath sounds: Normal breath sounds.  Abdominal:     Palpations: Abdomen is soft.     Tenderness: There is no abdominal tenderness.  Musculoskeletal:     Cervical back: Neck supple.     Right lower leg: No edema.     Left lower leg: No edema.  Skin:    General: Skin is warm and dry.     Capillary Refill: Capillary refill takes less than 2 seconds.  Neurological:     General: No focal deficit present.     Mental Status: She is alert.  Psychiatric:        Mood and Affect: Mood normal.      ____________________________________________   LABS (all labs ordered are listed, but only abnormal results are displayed)  Labs Reviewed  BASIC METABOLIC PANEL - Abnormal; Notable for the following components:      Result Value   Glucose, Bld 212 (*)    Calcium 8.8 (*)    All other components within normal limits  CBC - Abnormal; Notable for the following components:   Hemoglobin 11.9 (*)    MCH 25.8 (*)    All other components within normal limits  CBG MONITORING, ED - Abnormal; Notable for the following components:   Glucose-Capillary 208 (*)    All other components within normal limits  CBG MONITORING, ED - Abnormal; Notable for the following components:   Glucose-Capillary 203 (*)    All other components within normal limits  RESPIRATORY PANEL BY RT PCR (FLU A&B, COVID)  FIBRIN DERIVATIVES D-DIMER ([facility] ONLY)  BRAIN NATRIURETIC PEPTIDE  MAGNESIUM  TSH  HEMOGLOBIN A1C  TROPONIN I (HIGH SENSITIVITY)  TROPONIN I (HIGH SENSITIVITY)   ____________________________________________  EKG  Sinus rhythm with a ventricular rate of 83, normal axis, unremarkable intervals, PVCs without clear evidence of  acute ischemia or other significant underlying arrhythmia. ____________________________________________  RADIOLOGY  ED MD interpretation: No overt edema, large effusion, focal consolidation, or other acute intrathoracic process.  Official  radiology report(s): DG Chest 2 View  Result Date: 08/10/2020 CLINICAL DATA:  Central chest pressure, short of breath EXAM: CHEST - 2 VIEW COMPARISON:  None. FINDINGS: The heart size and mediastinal contours are within normal limits. Both lungs are clear. The visualized skeletal structures are unremarkable. IMPRESSION: No active cardiopulmonary disease. Electronically Signed   By: Randa Ngo M.D.   On: 08/10/2020 15:05    ____________________________________________   PROCEDURES  Procedure(s) performed (including Critical Care):  .1-3 Lead EKG Interpretation Performed by: Lucrezia Starch, MD Authorized by: Lucrezia Starch, MD     Interpretation: normal     ECG rate assessment: normal     Rhythm: sinus rhythm     Ectopy: none     Conduction: normal       ____________________________________________   INITIAL IMPRESSION / ASSESSMENT AND PLAN / ED COURSE        Patient presents with [place] to history exam for episodes of some chest pressure associate with shortness of breath nonproductive cough and some dizziness that been intermittent over the last month.  On arrival patient is hypertensive with a BP of 143/49 with otherwise stable vital signs on room air.  Primary differential includes but is not limited to arrhythmia, ACS, PE, CHF, anemia, metabolic derangements, hyperthyroidism, anxiety.  ECG shows no evidence of acute ischemia but does show some PVCs.  Given nonelevated findings low suspicion for ACS at this time.  No arrhythmia identified on ECG or telemetry.  Low suspicion for PE given D-dimer is<500.  Patient is not appear globally volume overloaded on exam chest x-ray shows no evidence of edema and her BNP is not elevated.  CBC shows evidence of anemia with hemoglobin of 11.9 I will suspicion this is causing patient symptoms as her last hemoglobin of 12.6 this 2 years ago does not represent a significant drop.  CBC other labs unremarkable.  BMP remarkable for some  hyperglycemia with a glucose of 212 with no evidence of acidosis or other significant metabolic derangements.  This is consistent with patient's known history of diabetes.  Given otherwise reassuring vital signs and work-up with patient asymptomatic during my assessment I believe she is safe for discharge with close outpatient PCP follow-up.  Patient discharged stable condition per strict precautions advised discussed.          ____________________________________________   FINAL CLINICAL IMPRESSION(S) / ED DIAGNOSES  Final diagnoses:  Chest pain, unspecified type  Hyperglycemia    Medications  insulin aspart (novoLOG) injection 0-15 Units (0 Units Subcutaneous Refused 08/10/20 1718)     ED Discharge Orders    None       Note:  This document was prepared using Dragon voice recognition software and may include unintentional dictation errors.   Lucrezia Starch, MD 08/10/20 1739

## 2022-01-06 ENCOUNTER — Ambulatory Visit: Payer: Self-pay | Admitting: Dermatology

## 2023-03-23 ENCOUNTER — Emergency Department
Admission: EM | Admit: 2023-03-23 | Discharge: 2023-03-23 | Disposition: A | Discharge status: Home / Self Care | Payer: BLUE CROSS/BLUE SHIELD | Attending: Emergency Medicine | Admitting: Emergency Medicine

## 2023-03-23 ENCOUNTER — Emergency Department: Payer: BLUE CROSS/BLUE SHIELD

## 2023-03-23 ENCOUNTER — Other Ambulatory Visit: Payer: Self-pay

## 2023-03-23 DIAGNOSIS — R42 Dizziness and giddiness: Secondary | ICD-10-CM | POA: Diagnosis not present

## 2023-03-23 LAB — ETHANOL: Alcohol, Ethyl (B): <10 mg/dL (ref <10)

## 2023-03-23 LAB — BASIC METABOLIC PANEL
Anion gap: 7 (ref 5–15)
BUN: 20 mg/dL (ref 6–20)
CO2: 27 mmol/L (ref 22–32)
Calcium: 9.4 mg/dL (ref 8.9–10.3)
Chloride: 108 mmol/L (ref 98–111)
Creatinine, Ser: 0.79 mg/dL (ref 0.44–1.00)
GFR, Estimated: >60 mL/min (ref >60)
Glucose, Bld: 112 mg/dL — ABNORMAL HIGH (ref 70–99)
Potassium: 3.9 mmol/L (ref 3.5–5.1)
Sodium: 142 mmol/L (ref 135–145)

## 2023-03-23 LAB — CBC WITH DIFFERENTIAL/PLATELET
Abs Immature Granulocytes: 0.03 10*3/uL (ref 0.00–0.07)
Basophils Absolute: 0.1 10*3/uL (ref 0.0–0.1)
Basophils Relative: 1 %
Eosinophils Absolute: 0.2 10*3/uL (ref 0.0–0.5)
Eosinophils Relative: 3 %
HCT: 35.2 % — ABNORMAL LOW (ref 36.0–46.0)
Hemoglobin: 10.9 g/dL — ABNORMAL LOW (ref 12.0–15.0)
Immature Granulocytes: 0 %
Lymphocytes Relative: 35 %
Lymphs Abs: 2.5 10*3/uL (ref 0.7–4.0)
MCH: 25.5 pg — ABNORMAL LOW (ref 26.0–34.0)
MCHC: 31 g/dL (ref 30.0–36.0)
MCV: 82.4 fL (ref 80.0–100.0)
Monocytes Absolute: 0.6 10*3/uL (ref 0.1–1.0)
Monocytes Relative: 8 %
Neutro Abs: 3.8 10*3/uL (ref 1.7–7.7)
Neutrophils Relative %: 53 %
Platelets: 376 10*3/uL (ref 150–400)
RBC: 4.27 MIL/uL (ref 3.87–5.11)
RDW: 13.7 % (ref 11.5–15.5)
WBC: 7.2 10*3/uL (ref 4.0–10.5)
nRBC: 0 % (ref 0.0–0.2)

## 2023-03-23 LAB — ACETAMINOPHEN LEVEL: Acetaminophen (Tylenol), Serum: <10 ug/mL — ABNORMAL LOW (ref 10–30)

## 2023-03-23 LAB — SALICYLATE LEVEL: Salicylate Lvl: <7 mg/dL — ABNORMAL LOW (ref 7.0–30.0)

## 2023-03-23 MED ORDER — SODIUM CHLORIDE 0.9 % IV BOLUS: 1000.0000 mL | Freq: Once | INTRAVENOUS | Status: DC

## 2023-03-23 MED ORDER — DIAZEPAM 5 MG/ML IJ SOLN
10.0000 mg | Freq: Once | INTRAMUSCULAR | Status: DC
  Filled 2023-03-23: qty 2

## 2023-03-23 NOTE — Discharge Instructions (Addendum)
Your lab tests and CT scan of the head were all okay.  Continue taking your medications at home and follow up with your doctor.

## 2023-03-23 NOTE — ED Triage Notes (Signed)
Pt to ED via [facility] from home. Per EMS initially called out for "breathing problems". Per EMS pt not talking to EMS or following commands. Per EMS pt noted to be hyperventilating, hands noted to be cramping. Per EMS only response that patient has given them was to nod when asked if she was feeling anxious.    155/91. 65 HR 99% RA CBG 103

## 2023-03-23 NOTE — ED Triage Notes (Signed)
Pt's husband is with pt at this time. Sts he got up to use the bathroom and when he got back to the bed pt was awake and telling him that she was dizzy. Pt's husband stated that she ambulated to the living room for her BP cuff and they checked it. Pt at this time has her eyes closed and is not answering any questions except stated once "I'm spinning" pt did open eyes during lab draw.

## 2023-03-23 NOTE — ED Notes (Signed)
Pt shook her head yes or no during several triage questions.

## 2023-03-23 NOTE — ED Provider Notes (Addendum)
[facility] Provider Note    Event Date/Time   First MD Initiated Contact with Patient 03/23/23 [id number]     (approximate)   History   Chief Complaint: Dizziness   HPI  Stacy Monroe is a 57 y.o. female who comes to the ED due to "spinning" sensation.  Patient reportedly was in her usual state of health and then woke up during the night and felt like everything was spinning.  Also complains of headache on the left side of the head which was not thunderclap in onset but feels more severe than a typical migraine.  Denies any falls or trauma, no neck pain, no fevers or chills or neck stiffness.  No vomiting.  No vision changes.  No paresthesias or motor weakness.     Physical Exam   Triage Vital Signs: ED Triage Vitals [03/23/23 0318]  Enc Vitals Group     BP 139/84     Pulse Rate 64     Resp 16     Temp 97.7 F (36.5 C)     Temp src      SpO2 100 %     Weight      Height      Head Circumference      Peak Flow      Pain Score 0     Pain Loc      Pain Edu?      Excl. in GC?     Most recent vital signs: Vitals:   03/23/23 0318  BP: 139/84  Pulse: 64  Resp: 16  Temp: 97.7 F (36.5 C)  SpO2: 100%    General: Awake, no distress.  CV:  Good peripheral perfusion.  Regular rate and rhythm Resp:  Normal effort.  Clear to auscultation bilaterally Abd:  No distention.  Soft nontender Other:  Cranial nerves II through XII intact.  PERRL, EOMI, no nystagmus.  Motor function intact.  Finger-to-nose normal.  Exam is somewhat inconsistent.  When asked to perform a specific action patient will do it slowly and hesitantly, but when distracted to other areas she moves both arms quickly and fluidly.  Eyes are also help close during most of the exam, but at times when I talk to her family, she quickly opens her eyes and has a sharp, intense gaze.   ED Results / Procedures / Treatments   Labs (all labs ordered are listed, but only abnormal results are  displayed) Labs Reviewed  CBC WITH DIFFERENTIAL/PLATELET - Abnormal; Notable for the following components:      Result Value   Hemoglobin 10.9 (*)    HCT 35.2 (*)    MCH 25.5 (*)    All other components within normal limits  BASIC METABOLIC PANEL - Abnormal; Notable for the following components:   Glucose, Bld 112 (*)    All other components within normal limits  SALICYLATE LEVEL - Abnormal; Notable for the following components:   Salicylate Lvl <7.0 (*)    All other components within normal limits  ACETAMINOPHEN LEVEL - Abnormal; Notable for the following components:   Acetaminophen (Tylenol), Serum <10 (*)    All other components within normal limits  ETHANOL  URINE DRUG SCREEN, QUALITATIVE ([facility] ONLY)  URINALYSIS, W/ REFLEX TO CULTURE (INFECTION SUSPECTED)     EKG Interpreted by me Sinus rhythm rate of 61.  Normal axis, normal intervals.  Normal QRS ST segments and T waves.   RADIOLOGY CT head interpreted by me, negative for intracranial hemorrhage  or mass.  Radiology report reviewed   PROCEDURES:  Procedures   MEDICATIONS ORDERED IN ED: Medications  diazepam (VALIUM) injection 10 mg (10 mg Intravenous Not Given 03/23/23 0613)  sodium chloride 0.9 % bolus 1,000 mL (1,000 mLs Intravenous Bolus 03/23/23 0612)     IMPRESSION / MDM / ASSESSMENT AND PLAN / ED COURSE  I reviewed the triage vital signs and the nursing notes.  DDx: Intracranial hemorrhage, vertigo, electrolyte abnormality, toxidrome, anemia, dehydration, anxiety/psychogenic, migraine  Patient's presentation is most consistent with acute presentation with potential threat to life or bodily function.  Patient presents with feeling of spinning.  Labs are normal, exam is normal.  Exam is somewhat suggestive of a factitious cause or recurrent migraine.  Will check labs, give IV fluids and Valium for symptom relief.  Check CT head.  Doubt stroke, aneurysm, arterial dissection  meningitis/encephalitis  ----------------------------------------- 6:36 AM on 03/23/2023 ----------------------------------------- Workup negative.  Patient feeling better after fluids.  Symptoms resolved.  She refused Valium.  Stable for discharge.         FINAL CLINICAL IMPRESSION(S) / ED DIAGNOSES   Final diagnoses:  Dizziness     Rx / DC Orders   ED Discharge Orders     None        Note:  This document was prepared using Dragon voice recognition software and may include unintentional dictation errors.   Sharman Cheek, MD 03/23/23 1017    Sharman Cheek, MD 03/23/23 [phone]
